# Patient Record
Sex: Female | Born: 1960 | Race: White | Hispanic: No | Marital: Single | Smoking: Never smoker
Health system: Southern US, Community
[De-identification: ages and names within clinical notes are randomized; demographics above are authoritative.]

## PROBLEM LIST (undated history)

## (undated) DIAGNOSIS — C50919 Malignant neoplasm of unspecified site of unspecified female breast: Secondary | ICD-10-CM

## (undated) DIAGNOSIS — I10 Essential (primary) hypertension: Secondary | ICD-10-CM

## (undated) DIAGNOSIS — C50212 Malignant neoplasm of upper-inner quadrant of left female breast: Secondary | ICD-10-CM

## (undated) DIAGNOSIS — E119 Type 2 diabetes mellitus without complications: Secondary | ICD-10-CM

## (undated) DIAGNOSIS — Z803 Family history of malignant neoplasm of breast: Secondary | ICD-10-CM

## (undated) DIAGNOSIS — Z973 Presence of spectacles and contact lenses: Secondary | ICD-10-CM

## (undated) HISTORY — DX: Essential (primary) hypertension: I10

## (undated) HISTORY — PX: TONSILLECTOMY: SHX5217

## (undated) HISTORY — DX: Malignant neoplasm of upper-inner quadrant of left female breast: C50.212

## (undated) HISTORY — PX: WISDOM TOOTH EXTRACTION: SHX21

## (undated) HISTORY — DX: Family history of malignant neoplasm of breast: Z80.3

---

## 1999-12-31 ENCOUNTER — Encounter: Payer: Self-pay | Admitting: *Deleted

## 1999-12-31 ENCOUNTER — Ambulatory Visit (HOSPITAL_COMMUNITY): Admission: RE | Admit: 1999-12-31 | Discharge: 1999-12-31 | Payer: Self-pay | Admitting: *Deleted

## 2010-09-13 ENCOUNTER — Encounter: Payer: Self-pay | Admitting: Internal Medicine

## 2014-08-26 ENCOUNTER — Other Ambulatory Visit: Payer: Self-pay | Admitting: Internal Medicine

## 2014-08-26 DIAGNOSIS — N6009 Solitary cyst of unspecified breast: Secondary | ICD-10-CM

## 2014-09-18 ENCOUNTER — Other Ambulatory Visit: Payer: Self-pay | Admitting: Internal Medicine

## 2014-09-18 DIAGNOSIS — N6320 Unspecified lump in the left breast, unspecified quadrant: Secondary | ICD-10-CM

## 2014-09-26 ENCOUNTER — Other Ambulatory Visit: Payer: Self-pay | Admitting: Internal Medicine

## 2014-09-26 ENCOUNTER — Ambulatory Visit
Admission: RE | Admit: 2014-09-26 | Discharge: 2014-09-26 | Disposition: A | Discharge status: Home / Self Care | Payer: Self-pay | Source: Ambulatory Visit | Attending: Internal Medicine | Admitting: Internal Medicine

## 2014-09-26 DIAGNOSIS — N6320 Unspecified lump in the left breast, unspecified quadrant: Secondary | ICD-10-CM

## 2014-09-29 ENCOUNTER — Other Ambulatory Visit: Payer: Self-pay | Admitting: Internal Medicine

## 2014-09-29 DIAGNOSIS — C50912 Malignant neoplasm of unspecified site of left female breast: Secondary | ICD-10-CM

## 2014-09-30 ENCOUNTER — Ambulatory Visit (HOSPITAL_COMMUNITY): Payer: 59 | Attending: Hematology

## 2014-09-30 ENCOUNTER — Telehealth: Payer: Self-pay | Admitting: *Deleted

## 2014-09-30 NOTE — Telephone Encounter (Signed)
Confirmed [facility] for 10/08/13 at 8am .  Instructions and contact information given.

## 2014-10-01 ENCOUNTER — Encounter: Payer: Self-pay | Admitting: *Deleted

## 2014-10-01 ENCOUNTER — Other Ambulatory Visit: Payer: Self-pay | Admitting: *Deleted

## 2014-10-01 DIAGNOSIS — C50212 Malignant neoplasm of upper-inner quadrant of left female breast: Secondary | ICD-10-CM

## 2014-10-01 HISTORY — DX: Malignant neoplasm of upper-inner quadrant of left female breast: C50.212

## 2014-10-02 ENCOUNTER — Other Ambulatory Visit: Payer: Self-pay

## 2014-10-07 ENCOUNTER — Ambulatory Visit
Admission: RE | Admit: 2014-10-07 | Discharge: 2014-10-07 | Disposition: A | Discharge status: Home / Self Care | Payer: 59 | Source: Ambulatory Visit | Attending: Internal Medicine | Admitting: Internal Medicine

## 2014-10-07 ENCOUNTER — Other Ambulatory Visit: Payer: Self-pay | Admitting: Internal Medicine

## 2014-10-07 DIAGNOSIS — C50912 Malignant neoplasm of unspecified site of left female breast: Secondary | ICD-10-CM

## 2014-10-07 MED ORDER — GADOBENATE DIMEGLUMINE 529 MG/ML IV SOLN
20.0000 mL | Freq: Once | INTRAVENOUS | Status: AC | PRN
  Administered 2014-10-07: 20 mL via INTRAVENOUS

## 2014-10-08 ENCOUNTER — Other Ambulatory Visit (HOSPITAL_BASED_OUTPATIENT_CLINIC_OR_DEPARTMENT_OTHER): Payer: 59

## 2014-10-08 ENCOUNTER — Encounter: Payer: Self-pay | Admitting: *Deleted

## 2014-10-08 ENCOUNTER — Ambulatory Visit
Admission: RE | Admit: 2014-10-08 | Discharge: 2014-10-08 | Disposition: A | Discharge status: Home / Self Care | Payer: 59 | Source: Ambulatory Visit | Attending: Radiation Oncology | Admitting: Radiation Oncology

## 2014-10-08 ENCOUNTER — Encounter: Payer: Self-pay | Admitting: Hematology

## 2014-10-08 ENCOUNTER — Ambulatory Visit: Payer: 59

## 2014-10-08 ENCOUNTER — Encounter: Payer: Self-pay | Admitting: Skilled Nursing Facility1

## 2014-10-08 ENCOUNTER — Ambulatory Visit (HOSPITAL_BASED_OUTPATIENT_CLINIC_OR_DEPARTMENT_OTHER): Payer: 59 | Admitting: Hematology

## 2014-10-08 ENCOUNTER — Ambulatory Visit (INDEPENDENT_AMBULATORY_CARE_PROVIDER_SITE_OTHER): Payer: Self-pay | Admitting: Surgery

## 2014-10-08 ENCOUNTER — Telehealth: Payer: Self-pay | Admitting: Hematology

## 2014-10-08 VITALS — BP 186/103 | HR 89 | Temp 98.4°F | Resp 18 | Ht 65.0 in | Wt 227.2 lb

## 2014-10-08 DIAGNOSIS — C773 Secondary and unspecified malignant neoplasm of axilla and upper limb lymph nodes: Secondary | ICD-10-CM | POA: Diagnosis not present

## 2014-10-08 DIAGNOSIS — C50212 Malignant neoplasm of upper-inner quadrant of left female breast: Secondary | ICD-10-CM | POA: Diagnosis not present

## 2014-10-08 DIAGNOSIS — Z171 Estrogen receptor negative status [ER-]: Secondary | ICD-10-CM

## 2014-10-08 LAB — CBC WITH DIFFERENTIAL/PLATELET
BASO%: 0.4 % (ref 0.0–2.0)
Basophils Absolute: 0 10*3/uL (ref 0.0–0.1)
EOS ABS: 0.1 10*3/uL (ref 0.0–0.5)
EOS%: 0.9 % (ref 0.0–7.0)
HCT: 44.3 % (ref 34.8–46.6)
HGB: 15.5 g/dL (ref 11.6–15.9)
LYMPH%: 14.8 % (ref 14.0–49.7)
MCH: 34.1 pg — ABNORMAL HIGH (ref 25.1–34.0)
MCHC: 35 g/dL (ref 31.5–36.0)
MCV: 97.4 fL (ref 79.5–101.0)
MONO#: 0.7 10*3/uL (ref 0.1–0.9)
MONO%: 6.7 % (ref 0.0–14.0)
NEUT%: 77.2 % — AB (ref 38.4–76.8)
NEUTROS ABS: 8.2 10*3/uL — AB (ref 1.5–6.5)
Platelets: 175 10*3/uL (ref 145–400)
RBC: 4.55 10*6/uL (ref 3.70–5.45)
RDW: 12.6 % (ref 11.2–14.5)
WBC: 10.7 10*3/uL — AB (ref 3.9–10.3)
lymph#: 1.6 10*3/uL (ref 0.9–3.3)

## 2014-10-08 LAB — COMPREHENSIVE METABOLIC PANEL (CC13)
ALT: 25 U/L (ref 0–55)
ANION GAP: 13 meq/L — AB (ref 3–11)
AST: 25 U/L (ref 5–34)
Albumin: 3.8 g/dL (ref 3.5–5.0)
Alkaline Phosphatase: 98 U/L (ref 40–150)
BUN: 15.2 mg/dL (ref 7.0–26.0)
CO2: 21 meq/L — AB (ref 22–29)
CREATININE: 0.9 mg/dL (ref 0.6–1.1)
Calcium: 9.2 mg/dL (ref 8.4–10.4)
Chloride: 102 mEq/L (ref 98–109)
EGFR: 73 mL/min/{1.73_m2} — ABNORMAL LOW (ref >90)
Glucose: 144 mg/dl — ABNORMAL HIGH (ref 70–140)
Potassium: 4.4 mEq/L (ref 3.5–5.1)
Sodium: 136 mEq/L (ref 136–145)
TOTAL PROTEIN: 7.3 g/dL (ref 6.4–8.3)
Total Bilirubin: 0.52 mg/dL (ref 0.20–1.20)

## 2014-10-08 MED ORDER — LORAZEPAM 1 MG PO TABS: 1.0000 mg | ORAL_TABLET | Freq: Every evening | ORAL | Status: DC | PRN

## 2014-10-08 NOTE — Progress Notes (Signed)
  Radiation Oncology         [phone]) [phone] ________________________________  Initial outpatient Consultation - Date: 10/08/2014   Name: Ashlee Hill MRN: [id number]   DOB: 1960/10/25  REFERRING PHYSICIAN: Erroll Luna, MD  STAGE: Breast cancer of upper-inner quadrant of left female breast   Staging form: Breast, AJCC 7th Edition     Clinical stage from 10/08/2014: Stage IIB (T2, N1, M0) - Unsigned     Pathologic: Stage IIB (T2, N1, cM0) - Unsigned  HISTORY OF PRESENT ILLNESS::Ashlee Hill is a 54 y.o. female  Who palpated a left breast mass. She was found to have a 2.5 cm mass in the left breast and left axillary lymph nodes were enlarged. MRI was performed last night and report is pending. 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 The lymph node was also positive for malignancy. She is tearful and overwhelmed.  She is accompanied by her parents and friend. She has some soreness after her biopsy. She is interested in breast conservation. She is GxP0 and still menstruating. No personal history of cancer.   PREVIOUS RADIATION THERAPY: No  Past medical, social and family history were reviewed in the electronic chart. Review of symptoms was reviewed in the electronic chart. Medications were reviewed in the electronic chart.   PHYSICAL EXAM: There were no vitals filed for this visit.. . Pleasant female. Moderate distress. No palpable adenopathy. Palpable mass in the left breast at the 12 o'clock position about 3 cm. Not fixed. No palpable abnormalities of the right breast.   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    PLAN:We discussed the role of radiation and decreasing local failures in patients who undergo lumpectomy. We discussed the retrospective data showing an increase in failure rates in patients who have a pathologic complete response and did not undergo radiation. For this reason I have recommended  radiation to the whole breast followed by boost to the tumor bed. We discussed the process of simulation the placement tattoos. We discussed possible side effects during treatment including but not limited to skin irritation darkness and fatigue. We discussed long-term effects of treatment which are extremely unlikely but possible including damage to the lungs and ribs. We discussed the low likelihood of secondary malignancies.  She is going to undergo staging and genetics. She had many financial concerns and we will get her scheduled with financial planning. She met with social workers TEFL teacher.    I spent 40 minutes face to face with the patient and more than 50% of that time was spent in counseling and/or coordination of care.   ------------------------------------------------  Thea Silversmith, MD

## 2014-10-08 NOTE — Progress Notes (Signed)
Subjective:     Patient ID: Ashlee Hill, female   DOB: [birth year], 54 y.o.   MRN: [id number]  HPI   Review of Systems     Objective:   Physical Exam  For the patient to understand and be given the tools to implement a healthy plant based diet during their cancer diagnosis.     Assessment:     Patient was seen today and found to be distressed and tearful and was accompanied by a seemingly supportive friend. Palpable left breast mass. Patients current/relevant medications: Lipitor, multivitamin, omega 3, garcinia gambosia (patient states she is taking this supplement as an appetite suppressant. Patient states she gets an upset stomach/nausea when she is mentally distressed and was curious about protein shakes for when she cannot eat.  Patient was not retaining much if any information as she was vastly overwhelmed and in information overload.   Patient is 5'5'' at 227 pounds with a BMI of 37.9.  Her WBC 10.7, CO2 21, and Glucose 144.     Plan:     Dietitian educated the patient on implementing a plant based diet by incorporating more plant proteins, fruits, and vegetables. As a part of a healthy routine physical activity was discussed. The importance of legitimate, evidence based information was discussed and examples were given. A folder of evidence based information with a focus on a plant based diet and general nutrition during cancer was given to the patient. Dietitian educated the patient on supplements regulation and the possible interactions with prescribed medications.  Dietitian educated the patient on the uses of protein shakes or smoothies when she cannot consume whole foods.  As a part of the continuum of care the cancer dietitian's contact information was given to the patient in the event they would like to have a follow up appointment.

## 2014-10-08 NOTE — Progress Notes (Signed)
Checked in new pt with no financial concerns prior to seeing the dr. Informed pt if chemo is part of her treatment Ashlee Hill will get auth from her insurance company as well as contact foundations that offer copay assistance for chemo if needed. She has Ashlee Hill's card for any billing questions or concerns.

## 2014-10-08 NOTE — Progress Notes (Deleted)
[facility] [place]  [phone] Fax:([phone]) Anthony Note   Patient Care Team: Kandice Hams, MD as PCP - General (Internal Medicine) Erroll Luna, MD as Consulting Physician (General Surgery) Truitt Merle, MD as Consulting Physician (Hematology) Thea Silversmith, MD as Consulting Physician (Radiation Oncology) Roselee Culver, RN as Registered Nurse [facility] Caleen Jobs, RN as Registered Nurse 10/08/2014  CHIEF COMPLAINTS/PURPOSE OF CONSULTATION:  ***  HISTORY OF PRESENTING ILLNESS:  Ashlee Hill 54 y.o. female is here because of ***  MEDICAL HISTORY:  Past Medical History  Diagnosis Date  . Breast cancer of upper-inner quadrant of left female breast 10/01/2014    SURGICAL HISTORY: No past surgical history on file.  SOCIAL HISTORY: History   Social History  . Marital Status: Single    Spouse Name: N/A  . Number of Children: N/A  . Years of Education: N/A   Occupational History  . Not on file.   Social History Main Topics  . Smoking status: Not on file  . Smokeless tobacco: Not on file  . Alcohol Use: Not on file  . Drug Use: Not on file  . Sexual Activity: Not on file   Other Topics Concern  . Not on file   Social History Narrative  . No narrative on file    FAMILY HISTORY: No family history on file.  ALLERGIES:  has no allergies on file.  MEDICATIONS:  No current outpatient prescriptions on file.   No current facility-administered medications for this visit.    REVIEW OF SYSTEMS:   Constitutional: Denies fevers, chills or abnormal night sweats Eyes: Denies blurriness of vision, double vision or watery eyes Ears, nose, mouth, throat, and face: Denies mucositis or sore throat Respiratory: Denies cough, dyspnea or wheezes Cardiovascular: Denies palpitation, chest discomfort or lower extremity swelling Gastrointestinal:  Denies nausea, heartburn or change in bowel habits Skin: Denies abnormal skin  rashes Lymphatics: Denies new lymphadenopathy or easy bruising Neurological:Denies numbness, tingling or new weaknesses Behavioral/Psych: Mood is stable, no new changes  All other systems were reviewed with the patient and are negative.  PHYSICAL EXAMINATION: ECOG PERFORMANCE STATUS: {CHL ONC ECOG PS:[id number]}  There were no vitals filed for this visit. There were no vitals filed for this visit.  GENERAL:alert, no distress and comfortable SKIN: skin color, texture, turgor are normal, no rashes or significant lesions EYES: normal, conjunctiva are pink and non-injected, sclera clear OROPHARYNX:no exudate, no erythema and lips, buccal mucosa, and tongue normal  NECK: supple, thyroid normal size, non-tender, without nodularity LYMPH:  no palpable lymphadenopathy in the cervical, axillary or inguinal LUNGS: clear to auscultation and percussion with normal breathing effort HEART: regular rate & rhythm and no murmurs and no lower extremity edema ABDOMEN:abdomen soft, non-tender and normal bowel sounds Musculoskeletal:no cyanosis of digits and no clubbing  PSYCH: alert & oriented x 3 with fluent speech NEURO: no focal motor/sensory deficits  LABORATORY DATA:  I have reviewed the data as listed No results found for: WBC, HGB, HCT, MCV, PLT No results for input(s): NA, K, CL, CO2, GLUCOSE, BUN, CREATININE, CALCIUM, GFRNONAA, GFRAA, PROT, ALBUMIN, AST, ALT, ALKPHOS, BILITOT, BILIDIR, IBILI in the last 8760 hours.  PATHOLOGY REPORT 09/26/2014  Estrogen Receptor: 0%, NEGATIVE Progesterone Receptor: 0%, NEGATIVE Proliferation Marker Ki67: 93% RADIOGRAPHIC STUDIES: I have personally reviewed the radiological images as listed and agreed with the findings in the report. Mm Digital Diagnostic Bilat  09/26/2014   CLINICAL DATA:  54 year old female with palpable mass in the  upper inner left breast discovered on self-examination. Also for annual bilateral mammograms.  EXAM: DIGITAL DIAGNOSTIC  BILATERAL MAMMOGRAM WITH CAD  ULTRASOUND LEFT BREAST  COMPARISON:  None  ACR Breast Density Category b: There are scattered areas of fibroglandular density.  FINDINGS: Spot compression views of the left breast and routine views of both breasts demonstrate a 2 x 2.5 cm irregular mass in the upper inner left breast.  Mildly prominent left axillary lymph nodes are noted.  No suspicious findings in the right breast identified.  Mammographic images were processed with CAD.  On physical exam, a firm fixed area of thickening is identified at the 11 o'clock position of the left breast 6 cm from the nipple.  Targeted ultrasound is performed, showing a 1.9 x 2.5 x 2.3 cm irregular hypoechoic mass at the 11 o'clock position of the left breast 6 cm from the nipple. An adjacent 1.1 x 0.5 x 0.4 cm irregular hypoechoic mass is noted, 4 mm anterior and lateral to the larger irregular mass. Both of these masses and compress an area measuring 3 cm.  Enlarged level 1 left axillary lymph nodes are identified.  IMPRESSION: 2.5 cm irregular mass in the upper inner left breast with adjacent 1.1 cm satellite mass and enlarged left axillary lymph nodes, highly suspicious for left breast malignancy and left axillary lymph node metastases. Tissue sampling is recommended.  No mammographic evidence of right breast malignancy.  RECOMMENDATION: Ultrasound-guided biopsies of dominant upper inner left breast mass and an enlarged left axillary lymph node. These procedures will be performed today but dictated in a separate report.  I have discussed the findings and recommendations with the patient. Results were also provided in writing at the conclusion of the visit. If applicable, a reminder letter will be sent to the patient regarding the next appointment.  BI-RADS CATEGORY  5: Highly suggestive of malignancy.   Electronically Signed   By: Hassan Rowan M.D.   On: 09/26/2014 13:04   Mm Digital Diagnostic Unilat L  09/26/2014   CLINICAL DATA:  Evaluate  clip placement following ultrasound-guided left breast biopsy.  EXAM: DIAGNOSTIC LEFT MAMMOGRAM POST ULTRASOUND BIOPSY  COMPARISON:  Previous exams  FINDINGS: Mammographic images were obtained following ultrasound guided biopsy of the 2.5 cm mass at the 11 o'clock position of the left breast.  The paper clip heart shaped tissue marker is in satisfactory position.  No immediate complications identified.  IMPRESSION: Satisfactory clip placement following ultrasound-guided left breast biopsy.  Final Assessment: Post Procedure Mammograms for Marker Placement   Electronically Signed   By: Hassan Rowan M.D.   On: 09/26/2014 13:11   US Breast Ltd Uni Left Inc Axilla  09/26/2014   CLINICAL DATA:  54 year old female with palpable mass in the upper inner left breast discovered on self-examination. Also for annual bilateral mammograms.  EXAM: DIGITAL DIAGNOSTIC BILATERAL MAMMOGRAM WITH CAD  ULTRASOUND LEFT BREAST  COMPARISON:  None  ACR Breast Density Category b: There are scattered areas of fibroglandular density.  FINDINGS: Spot compression views of the left breast and routine views of both breasts demonstrate a 2 x 2.5 cm irregular mass in the upper inner left breast.  Mildly prominent left axillary lymph nodes are noted.  No suspicious findings in the right breast identified.  Mammographic images were processed with CAD.  On physical exam, a firm fixed area of thickening is identified at the 11 o'clock position of the left breast 6 cm from the nipple.  Targeted ultrasound is performed, showing a  1.9 x 2.5 x 2.3 cm irregular hypoechoic mass at the 11 o'clock position of the left breast 6 cm from the nipple. An adjacent 1.1 x 0.5 x 0.4 cm irregular hypoechoic mass is noted, 4 mm anterior and lateral to the larger irregular mass. Both of these masses and compress an area measuring 3 cm.  Enlarged level 1 left axillary lymph nodes are identified.  IMPRESSION: 2.5 cm irregular mass in the upper inner left breast with adjacent 1.1  cm satellite mass and enlarged left axillary lymph nodes, highly suspicious for left breast malignancy and left axillary lymph node metastases. Tissue sampling is recommended.  No mammographic evidence of right breast malignancy.  RECOMMENDATION: Ultrasound-guided biopsies of dominant upper inner left breast mass and an enlarged left axillary lymph node. These procedures will be performed today but dictated in a separate report.  I have discussed the findings and recommendations with the patient. Results were also provided in writing at the conclusion of the visit. If applicable, a reminder letter will be sent to the patient regarding the next appointment.  BI-RADS CATEGORY  5: Highly suggestive of malignancy.   Electronically Signed   By: Hassan Rowan M.D.   On: 09/26/2014 13:04   [place] Lt Breast Bx W Loc Dev 1st Lesion Img Bx Spec US Guide  09/29/2014   ADDENDUM REPORT: 09/29/2014 12:06  ADDENDUM: Pathology revealed grade II invasive mammary carcinoma and mammary carcinoma in situ in the left breast. The left axillary lymph node showed metastatic mammary carcinoma. This was found to be concordant by Dr. Hassan Rowan. Pathology was discussed with the patient by telephone. She reported both biopsy sites were without bruising or tenderness. Post biopsy instructions and care were reviewed and her questions were answered. She has been scheduled at [facility] on October 08, 2014. A bilateral breast MRI has been scheduled on October 07, 2014. She is encouraged to come to [facility] for educational materials. My number was provided for future questions and concerns.  Pathology results reported by Susa Raring RN, BSN on October 08, 2014.   Electronically Signed   By: Hassan Rowan M.D.   On: 09/29/2014 12:06   09/29/2014   CLINICAL DATA:  54 year old female with irregular mass in the upper inner left breast and enlarged left axillary lymph nodes. For tissue sampling  of irregular mass in the upper inner left breast.  EXAM: ULTRASOUND GUIDED LEFT BREAST CORE NEEDLE BIOPSY  COMPARISON:  Previous exam(s).  FINDINGS: I met with the patient and we discussed the procedure of ultrasound-guided biopsy, including benefits and alternatives. We discussed the high likelihood of a successful procedure. We discussed the risks of the procedure, including infection, bleeding, tissue injury, clip migration, and inadequate sampling. Informed written consent was given. The usual time-out protocol was performed immediately prior to the procedure.  Using sterile technique and 2% Lidocaine as local anesthetic, under direct ultrasound visualization, a 14 gauge spring-loaded device was used to perform biopsy of the 2.5 cm mass at the 11 o'clock position of the left breast using a lateral approach. At the conclusion of the procedure a paper clip heart shaped tissue marker clip was deployed into the biopsy cavity. Follow up 2 view mammogram was performed and dictated separately.  IMPRESSION: Ultrasound guided biopsy of upper inner left breast mass. No apparent complications.  Pathology will be followed.  Electronically Signed: By: Hassan Rowan M.D. On: 09/26/2014 13:08   [place] Lt Breast Bx  W Loc Dev Ea Add Lesion Img Bx Spec US Guide  09/29/2014   ADDENDUM REPORT: 09/29/2014 12:07  ADDENDUM: Pathology revealed grade II invasive mammary carcinoma and mammary carcinoma in situ in the left breast. The left axillary lymph node showed metastatic mammary carcinoma. This was found to be concordant by Dr. Hassan Rowan. Pathology was discussed with the patient by telephone. She reported both biopsy sites were without bruising or tenderness. Post biopsy instructions and care were reviewed and her questions were answered. She has been scheduled at [facility] on October 08, 2014. A bilateral breast MRI has been scheduled on October 07, 2014. She is encouraged to come to [facility] for educational materials. My number was provided for future questions and concerns.  Pathology results reported by Susa Raring RN, BSN on September 29, 2014.   Electronically Signed   By: Hassan Rowan M.D.   On: 09/29/2014 12:07   09/29/2014   CLINICAL DATA:  54 year old female with irregular mass in the upper inner left breast and enlarged left axillary lymph nodes. For tissue sampling of an enlarged level 1 left axillary lymph node.  EXAM: ULTRASOUND GUIDED CORE NEEDLE BIOPSY OF A LEFT AXILLARY NODE  COMPARISON:  Previous exam(s).  FINDINGS: I met with the patient and we discussed the procedure of ultrasound-guided biopsy, including benefits and alternatives. We discussed the high likelihood of a successful procedure. We discussed the risks of the procedure, including infection, bleeding, tissue injury, clip migration, and inadequate sampling. Informed written consent was given. The usual time-out protocol was performed immediately prior to the procedure.  Using sterile technique and 2% Lidocaine as local anesthetic, under direct ultrasound visualization, a 14 gauge spring-loaded device was used to perform biopsy of an enlarged level 1 left axillary lymph node using a inferior approach. A tissue marker clip was not deployed.  IMPRESSION: Ultrasound guided biopsy of enlarged left axillary lymph node. No apparent complications.  Pathology will be followed.  Electronically Signed: By: Hassan Rowan M.D. On: 09/26/2014 13:10    ASSESSMENT & PLAN:  *** No orders of the defined types were placed in this encounter.    All questions were answered. The patient knows to call the clinic with any problems, questions or concerns. I spent {CHL ONC TIME VISIT - OZDGU:[id number]} counseling the patient face to face. The total time spent in the appointment was {CHL ONC TIME VISIT - DGLOV:[id number]} and more than 50% was on counseling.     Truitt Merle, MD 10/08/2014 8:56 AM

## 2014-10-08 NOTE — Telephone Encounter (Signed)
Confirm echo appt for 02/22.

## 2014-10-08 NOTE — H&P (Signed)
Amana L. Stonehocker 10/08/2014 7:46 AM Location: [facility] Patient #: [id number] DOB: [birth year] Undefined / Language: [name] / Race: Undefined Female History of Present Illness [name] A. Sravya Grissom MD; 10/08/2014 2:34 PM) Patient words: Pt presents to [facility] at the request of Dr Pablo Ledger for left breast cancer. Pt has 2.5 cm mass. Found by patient last september. Left upper outer breast. Not tender and no discharge.          CLINICAL DATA: 54 year old female with palpable mass in the upper inner left breast discovered on self-examination. Also for annual bilateral mammograms. EXAM: DIGITAL DIAGNOSTIC BILATERAL MAMMOGRAM WITH CAD ULTRASOUND LEFT BREAST COMPARISON: None ACR Breast Density Category b: There are scattered areas of fibroglandular density. FINDINGS: Spot compression views of the left breast and routine views of both breasts demonstrate a 2 x 2.5 cm irregular mass in the upper inner left breast. Mildly prominent left axillary lymph nodes are noted. No suspicious findings in the right breast identified. Mammographic images were processed with CAD. On physical exam, a firm fixed area of thickening is identified at the 11 o'clock position of the left breast 6 cm from the nipple. Targeted ultrasound is performed, showing a 1.9 x 2.5 x 2.3 cm irregular hypoechoic mass at the 11 o'clock position of the left breast 6 cm from the nipple. An adjacent 1.1 x 0.5 x 0.4 cm irregular hypoechoic mass is noted, 4 mm anterior and lateral to the larger irregular mass. Both of these masses and compress an area measuring 3 cm. Enlarged level 1 left axillary lymph nodes are identified. IMPRESSION: 2.5 cm irregular mass in the upper inner left breast with adjacent 1.1 cm satellite mass and enlarged left axillary lymph nodes, highly suspicious for left breast malignancy and left axillary lymph node metastases. Tissue sampling is recommended. No mammographic evidence of  right breast malignancy. RECOMMENDATION: Ultrasound-guided biopsies of dominant upper inner left breast mass and an enlarged left axillary lymph node. These procedures will be performed today but dictated in a separate report. I have discussed the findings and recommendations with the patient. Results were also provided in writing at the conclusion of the visit. If applicable, a reminder letter will be sent to the patient regarding the next appointment. BI-RADS CATEGORY 5: Highly suggestive of malignancy. Electronically Signed By: Hassan Rowan M.D. On: 09/26/2014 13:04   External Result Report  ADDITIONAL INFORMATION: 1. A sample was sent to [facility] for HER-2 testing by FISH. The results are as follows: Negative. (JBK:ecj 10/08/2014) Enid Cutter MD Pathologist, Electronic Signature ( Signed 10/08/2014) 1. PROGNOSTIC INDICATORS - ACIS Results: IMMUNOHISTOCHEMICAL AND MORPHOMETRIC ANALYSIS BY THE AUTOMATED CELLULAR IMAGING SYSTEM (ACIS) Estrogen Receptor: 0%, NEGATIVE Progesterone Receptor: 0%, NEGATIVE Proliferation Marker Ki67: 93% COMMENT: The negative hormone receptor study(ies) in this case have an internal positive control. REFERENCE RANGE ESTROGEN RECEPTOR NEGATIVE <1% POSITIVE =>1% PROGESTERONE RECEPTOR NEGATIVE <1% POSITIVE =>1% All controls stained appropriately Enid Cutter MD Pathologist, Electronic Signature ( Signed 10/01/2014) 1 of 3 FINAL for Lowenstein, Kynzley L ([id number]) ADDITIONAL INFORMATION:(continued) 1. The invasive carcinoma demonstrates strong diffuse E-cadherin expression; supporting a ductal phenotype. 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 (CRR:gt, 09/30/14) This is NOT signed out FINAL DIAGNOSIS Diagnosis 1. 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 2. 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 Microscopic Comment 1. Although grade of tumor is best assessed at resection, with these biopsies, both the in situ and invasive carcinoma are grade II. E cadherin and breast prognostic studies are pending and reported in an addendum. The case is reviewed with Dr. Lyndon Code who concurs. Mali RUND DO Pathologist, Electronic Signature (Case signed 09/29/2014)  CLINICAL DATA: 54 year old female with palpable mass in the upper inner left breast discovered on self-examination. Also for annual bilateral mammograms.  EXAM: DIGITAL DIAGNOSTIC BILATERAL MAMMOGRAM WITH CAD  ULTRASOUND LEFT BREAST  COMPARISON: None  ACR Breast Density Category b: There are scattered areas of fibroglandular density.  FINDINGS: Spot compression views of the left breast and routine views of both breasts demonstrate a 2 x 2.5 cm irregular mass in the upper inner left breast.  Mildly prominent left axillary lymph nodes are noted.  No suspicious findings in the right breast identified.  Mammographic images were processed with CAD.  On physical exam, a firm fixed area of thickening is identified at the 11 o'clock position of the left breast 6 cm from the nipple.  Targeted ultrasound is performed, showing a 1.9 x 2.5 x 2.3 cm irregular hypoechoic mass at the 11 o'clock position of the left breast 6 cm from the nipple. An adjacent 1.1 x 0.5 x 0.4 cm irregular hypoechoic mass is noted, 4 mm anterior and lateral to the larger irregular mass. Both of these masses and compress an area measuring 3 cm.  Enlarged level 1 left axillary lymph nodes are identified.  IMPRESSION: 2.5 cm irregular mass in the upper inner left breast with adjacent 1.1 cm satellite mass and enlarged left axillary lymph nodes, highly suspicious for left breast malignancy and left  axillary lymph node metastases. Tissue sampling is recommended.  No mammographic evidence of right breast malignancy.  RECOMMENDATION: Ultrasound-guided biopsies of dominant upper inner left breast mass and an enlarged left axillary lymph node. These procedures will be performed today but dictated in a separate report.  I have discussed the findings and recommendations with the patient. Results were also provided in writing at the conclusion of the visit. If applicable, a reminder letter will be sent to the patient regarding the next appointment.  BI-RADS CATEGORY 5: Highly suggestive of malignancy.   Electronically Signed By: Hassan Rowan M.D. On: 09/26/2014 13:04Specimen Gross and Clinical Information.  The patient is a 54 year old female   Other Problems Anderson Malta [place], [place]; 10/08/2014 7:46 AM) Anxiety Disorder Back Pain Depression Hemorrhoids High blood pressure Hypercholesterolemia  Past Surgical History Jeanann Lewandowsky, RMA; 10/08/2014 7:46 AM) Breast Biopsy Left. Oral Surgery Tonsillectomy  Diagnostic Studies History Anderson Malta [place], [place]; 10/08/2014 7:46 AM) Colonoscopy never Mammogram within last year Pap Smear >5 years ago  Social History Anderson Malta [place], RMA; 10/08/2014 7:46 AM) Alcohol use Moderate alcohol use. No caffeine use No drug use Tobacco use Never smoker.  Family History Anderson Malta [place], [place]; 10/08/2014 7:46 AM) Anesthetic complications Father, Mother. Arthritis Father, Mother. Bleeding disorder Father. Cervical Cancer Family Members In General. Depression Family Members In General, Father, Mother. Diabetes Mellitus Family Members In General. Heart Disease Father. Heart disease in female family member before age 75 Hypertension Family Members In General, Father, Mother. Ischemic Bowel Disease Mother. Melanoma Father. Migraine Headache Mother. Prostate Cancer Father. Respiratory Condition Family Members In  General. Thyroid problems Father, Mother.  Pregnancy / Birth History Anderson Malta [place], [place]; 10/08/2014 7:46 AM) Age at menarche 60 years. Gravida 0 Para 0 Regular periods     Review of  Systems [employer] Witty RMA; 10/08/2014 7:46 AM) General Not Present- Appetite Loss, Chills, Fatigue, Fever, Night Sweats, Weight Gain and Weight Loss. Skin Not Present- Change in Wart/Mole, Dryness, Hives, Jaundice, New Lesions, Non-Healing Wounds, Rash and Ulcer. HEENT Present- Nose Bleed and Seasonal Allergies. Not Present- Earache, Hearing Loss, Hoarseness, Oral Ulcers, Ringing in the Ears, Sinus Pain, Sore Throat, Visual Disturbances, Wears glasses/contact lenses and Yellow Eyes. Respiratory Not Present- Bloody sputum, Chronic Cough, Difficulty Breathing, Snoring and Wheezing. Breast Present- Breast Mass and Breast Pain. Not Present- Nipple Discharge and Skin Changes. Cardiovascular Not Present- Chest Pain, Difficulty Breathing Lying Down, Leg Cramps, Palpitations, Rapid Heart Rate, Shortness of Breath and Swelling of Extremities. Gastrointestinal Present- Bloating and Constipation. Not Present- Abdominal Pain, Bloody Stool, Change in Bowel Habits, Chronic diarrhea, Difficulty Swallowing, Excessive gas, Gets full quickly at meals, Hemorrhoids, Indigestion, Nausea, Rectal Pain and Vomiting. Female Genitourinary Not Present- Frequency, Nocturia, Painful Urination, Pelvic Pain and Urgency. Musculoskeletal Not Present- Back Pain, Joint Pain, Joint Stiffness, Muscle Pain, Muscle Weakness and Swelling of Extremities. Neurological Not Present- Decreased Memory, Fainting, Headaches, Numbness, Seizures, Tingling, Tremor, Trouble walking and Weakness. Psychiatric Present- Anxiety, Change in Sleep Pattern, Depression and Fearful. Not Present- Bipolar and Frequent crying. Endocrine Present- Hot flashes. Not Present- Cold Intolerance, Excessive Hunger, Hair Changes, Heat Intolerance and New Diabetes. Hematology Not  Present- Easy Bruising, Excessive bleeding, Gland problems, HIV and Persistent Infections.   Physical Exam (Keriana Sarsfield A. Tirth Cothron MD; 10/08/2014 2:35 PM)  General Mental Status-Alert. General Appearance-Consistent with stated age. Hydration-Well hydrated. Voice-Normal.  Head and Neck Head-normocephalic, atraumatic with no lesions or palpable masses. Trachea-midline. Thyroid Gland Characteristics - normal size and consistency.  Eye Eyeball - Bilateral-Extraocular movements intact. Sclera/Conjunctiva - Bilateral-No scleral icterus.  Chest and Lung Exam Chest and lung exam reveals -quiet, even and easy respiratory effort with no use of accessory muscles and on auscultation, normal breath sounds, no adventitious sounds and normal vocal resonance. Inspection Chest Wall - Normal. Back - normal.  Breast Breast - Left-Symmetric, Non Tender, No Biopsy scars, no Dimpling, No Inflammation, No Lumpectomy scars, No Mastectomy scars, No Peau d' Orange. Breast - Right-Symmetric, Non Tender, No Biopsy scars, no Dimpling, No Inflammation, No Lumpectomy scars, No Mastectomy scars, No Peau d' Orange. Breast Lump-No Palpable Breast Mass. Note: left breast mass upper outer quadreant 2 -3 cm mobile   Cardiovascular Cardiovascular examination reveals -normal heart sounds, regular rate and rhythm with no murmurs and normal pedal pulses bilaterally.  Abdomen Inspection Inspection of the abdomen reveals - No Hernias. Skin - Scar - no surgical scars. Palpation/Percussion Palpation and Percussion of the abdomen reveal - Soft, Non Tender, No Rebound tenderness, No Rigidity (guarding) and No hepatosplenomegaly. Auscultation Auscultation of the abdomen reveals - Bowel sounds normal.  Neurologic Neurologic evaluation reveals -alert and oriented x 3 with no impairment of recent or remote memory. Mental Status-Normal.  Musculoskeletal Normal Exam - Left-Upper Extremity  Strength Normal and Lower Extremity Strength Normal. Normal Exam - Right-Upper Extremity Strength Normal and Lower Extremity Strength Normal.  Lymphatic Head & Neck  General Head & Neck Lymphatics: Bilateral - Description - Normal. Axillary  General Axillary Region: Bilateral - Description - Normal. Tenderness - Non Tender. Note:1 - 2 cm mobile LN left AXILLLA no matted. Femoral & Inguinal  Generalized Femoral & Inguinal Lymphatics: Bilateral - Description - Normal. Tenderness - Non Tender.    Assessment & Plan (Michiah Mudry A. Kalijah Westfall MD; 10/08/2014 2:37 PM)  BREAST CANCER, LEFT (174.9  C50.912) Impression: sem with  oncology and radiation. Pt is a good chemotherapy candidate and may qualify for study to avoid ALND after chemtherapy. She will need a port and will get her set up for this. Risk of bleeding, infection, PTX, hemothorax, migration, fragmentation and replacement , organ injury discussed. She agrees to proceed.

## 2014-10-08 NOTE — Progress Notes (Signed)
[facility]  Telephone:([phone] Fax:([phone]) [place] Note   Patient Care Team: Kandice Hams, MD as PCP - General (Internal Medicine) Erroll Luna, MD as Consulting Physician (General Surgery) Truitt Merle, MD as Consulting Physician (Hematology) Thea Silversmith, MD as Consulting Physician (Radiation Oncology) Roselee Culver, RN as Registered Nurse [facility]), RN as Registered Nurse 10/12/2014  CHIEF COMPLAINTS/PURPOSE OF CONSULTATION:  Newly diagnosed breast cancer    Breast cancer of upper-inner quadrant of left female breast   09/26/2014 Breast US 2.5 cm irregular mass in the upper inner left breast with adjacent 1.1 cm satellite mass and enlarged left axillary lymph nodes, highly suspicious for left breast malignancy and left axillary lymph node metastases. Tissue sampling is recommended.    09/26/2014 Mammogram Spot compression views of the left breast and routine views of both breasts demonstrate a 2 x 2.5 cm irregular mass in the upper inner left breast.    09/29/2014 Initial Biopsy 1. Breast, left, needle core biopsy, mass, 11 o'clock - INVASIVE MAMMARY CARCINOMA, SEE COMMENT. - MAMMARY CARCINOMA IN SITU. 2. Lymph node, needle/core biopsy, left axillary - ONE LYMPH NODE, POSITIVE FOR METASTATIC MAMMARY CARCINOMA (1/1).   09/29/2014 Receptors her2 Estrogen Receptor: 0%, NEGATIVE Progesterone Receptor: 0%, NEGATIVE Proliferation Marker Ki67: 93%   10/01/2014 Initial Diagnosis Breast cancer of upper-inner quadrant of left female breast     HISTORY OF PRESENTING ILLNESS:  CHE BELOW 54 y.o. female presents to our multidisciplinary breast clinic today to discuss the management of her newly diagnosed breast cancer  She noticed a left breast lump in August 2015, no tenderness, skin or nipple change. She otherwise felt well. She did not seek immediate medical attention due to lack of insurance. She finally got her insurance  approved and saw her primary care physician recently. She was referred for mammogram which showed a 2.5 cm irregular mass in the upper inner left breast. She underwent left breast mass and axillary node biopsy on 09/29/2014 and both biopsy showed invasive ductal carcinoma, ER negative, PR negative, HER-2 negative.  She otherwise feels well, no pain or ther complains. She has good appetite and her weight is stable.  MEDICAL HISTORY:  Past Medical History  Diagnosis Date  . Breast cancer of upper-inner quadrant of left female breast 10/01/2014  . Hypertension     SURGICAL HISTORY: Past Surgical History  Procedure Laterality Date  . Tonsillectomy    . Wisdom tooth extraction       GYN HISTORY  Menarchal: 11 LMP: 09/27/2014 Contraceptive: no HRT:  G0P0    SOCIAL HISTORY: History   Social History  . Marital Status: Single    Spouse Name: N/A  . Number of Children: N/A  . Years of Education: N/A   Occupational History  . Not on file.   Social History Main Topics  . Smoking status: Never Smoker   . Smokeless tobacco: Not on file  . Alcohol Use: 0.6 oz/week    1 Glasses of wine per week     Comment: socail drinker  . Drug Use: No  . Sexual Activity: Not on file   Other Topics Concern  . Not on file   Social History Narrative    FAMILY HISTORY: Family History  Problem Relation Age of Onset  . Cancer Father   . Prostate cancer Father   . Breast cancer Paternal Aunt   . Lung cancer Maternal Grandfather   . Thyroid cancer Cousin  Father had prostate cancer at age of 66 Paternal aunt had breast caner in her 62's Paternal cousin had thyroid cancer at age of before 22 Maternal grandfather had lung cancer    ALLERGIES:  has No Known Allergies.  MEDICATIONS:  Current Outpatient Prescriptions  Medication Sig Dispense Refill  . ALPRAZolam (XANAX) 1 MG tablet Take 1 mg by mouth as needed for anxiety.    Marland Kitchen aspirin 81 MG tablet Take 81 mg by mouth daily.    Marland Kitchen  atorvastatin (LIPITOR) 10 MG tablet Take 10 mg by mouth daily.    . calcium carbonate (OS-CAL) 600 MG TABS tablet Take 600 mg by mouth daily with breakfast.    . gabapentin (NEURONTIN) 100 MG capsule Take 100 mg by mouth as needed.    Marland Kitchen GARCINIA CAMBOGIA-CHROMIUM PO Take 2 tablets by mouth daily.    . metoprolol (LOPRESSOR) 100 MG tablet Take 100 mg by mouth daily.    . Misc Natural Products (OSTEO BI-FLEX ADV DOUBLE ST PO) Take 1 tablet by mouth daily.    . Multiple Vitamin (MULTIVITAMIN) tablet Take 1 tablet by mouth daily.    . Nutritional Supplements (ESTROVEN PO) Take 1 tablet by mouth daily.    . Nutritional Supplements (SILICA) 67.8 MG CAPS Take 1 capsule by mouth daily.    . Omega-3 Fatty Acids (OMEGA-3 EPA FISH OIL PO) Take 2 tablets by mouth daily.    Marland Kitchen venlafaxine XR (EFFEXOR-XR) 150 MG 24 hr capsule Take 150 mg by mouth daily with breakfast.    . hydrochlorothiazide (HYDRODIURIL) 25 MG tablet TAKE 1 TABLET DAILY    . LORazepam (ATIVAN) 1 MG tablet Take 1 tablet (1 mg total) by mouth at bedtime as needed for sleep. 30 tablet 0   No current facility-administered medications for this visit.    REVIEW OF SYSTEMS:   Constitutional: Denies fevers, chills or abnormal night sweats Eyes: Denies blurriness of vision, double vision or watery eyes Ears, nose, mouth, throat, and face: Denies mucositis or sore throat Respiratory: Denies cough, dyspnea or wheezes Cardiovascular: Denies palpitation, chest discomfort or lower extremity swelling Gastrointestinal:  Denies nausea, heartburn or change in bowel habits Skin: Denies abnormal skin rashes Lymphatics: Denies new lymphadenopathy or easy bruising Neurological:Denies numbness, tingling or new weaknesses Behavioral/Psych: Mood is stable, no new changes  All other systems were reviewed with the patient and are negative.  PHYSICAL EXAMINATION: ECOG PERFORMANCE STATUS: 0 - Asymptomatic  Filed Vitals:   10/08/14 0858  BP: 186/103    Pulse: 89  Temp: 98.4 F (36.9 C)  Resp: 18   Filed Weights   10/08/14 0858  Weight: 227 lb 3.2 oz (103.057 kg)    GENERAL:alert, no distress and comfortable SKIN: skin color, texture, turgor are normal, no rashes or significant lesions EYES: normal, conjunctiva are pink and non-injected, sclera clear OROPHARYNX:no exudate, no erythema and lips, buccal mucosa, and tongue normal  NECK: supple, thyroid normal size, non-tender, without nodularity LYMPH:  no palpable lymphadenopathy in the cervical, axillary or inguinal LUNGS: clear to auscultation and percussion with normal breathing effort HEART: regular rate & rhythm and no murmurs and no lower extremity edema ABDOMEN:abdomen soft, non-tender and normal bowel sounds Musculoskeletal:no cyanosis of digits and no clubbing  PSYCH: alert & oriented x 3 with fluent speech NEURO: no focal motor/sensory deficits Breasts: Breast inspection showed them to be symmetrical with no nipple discharge. Palpation of the left breasts showed a 4X3cm mass in the upper outer quadrant, no palpable axillary lymph node.  Exam of the right breast and axillary was normal.    LABORATORY DATA:  I have reviewed the data as listed Lab Results  Component Value Date   WBC 10.7* 10/08/2014   HGB 15.5 10/08/2014   HCT 44.3 10/08/2014   MCV 97.4 10/08/2014   PLT 175 10/08/2014    Recent Labs  10/08/14 0833  NA 136  K 4.4  CO2 21*  GLUCOSE 144*  BUN 15.2  CREATININE 0.9  CALCIUM 9.2  PROT 7.3  ALBUMIN 3.8  AST 25  ALT 25  ALKPHOS 98  BILITOT 0.52    PATHOLOGY REPORT 09/26/2014 Diagnosis 1. Breast, left, needle core biopsy, mass, 11 o'clock - INVASIVE MAMMARY CARCINOMA, SEE COMMENT. - MAMMARY CARCINOMA IN SITU. 2. Lymph node, needle/core biopsy, left axillary - ONE LYMPH NODE, POSITIVE FOR METASTATIC MAMMARY CARCINOMA (1/1). Microscopic Comment 1. Although grade of tumor is best assessed at resection, with these biopsies, both the in situ and  invasive carcinoma are grade II. The invasive carcinoma demonstrates strong diffuse E-cadherin expression; supporting a ductal phenotype. 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  Estrogen Receptor: 0%, NEGATIVE Progesterone Receptor: 0%, NEGATIVE Proliferation Marker Ki67: 93% 1. A sample was sent to [facility] for HER-2 testing by FISH. The results are as follows: Negative.  RADIOGRAPHIC STUDIES: I have personally reviewed the radiological images as listed and agreed with the findings in the report.  Mr Breast Bilateral W Wo Contrast 10/08/2014    IMPRESSION: 1. Biopsy proven malignancy in the upper inner left breast with adjacent/contiguous areas of nodularity measures up to 3.5 cm, with multiple morphologically abnormal axillary lymph nodes compatible with known axillary metastases.  2. No MRI evidence of malignancy in the right breast.  RECOMMENDATION: Treatment plan for known left breast malignancy.  BI-RADS CATEGORY  6: Known biopsy-proven malignancy.   Electronically Signed   By: Everlean Alstrom M.D.   On: 10/08/2014 10:29   US Breast Ltd Uni Left Inc Axilla 09/26/2014    IMPRESSION: 2.5 cm irregular mass in the upper inner left breast with adjacent 1.1 cm satellite mass and enlarged left axillary lymph nodes, highly suspicious for left breast malignancy and left axillary lymph node metastases. Tissue sampling is recommended.  No mammographic evidence of right breast malignancy.      ASSESSMENT & PLAN:  54 year old pre-menopausal female with past medical history of hypertension, presented with a palpable left breast mass.  1. Left breast invasive ductal carcinoma, cT2N1Mx, triple negative  -I discussed her imaging finding and biopsy results extensively with patient. Her ultrasound and MRI breast reviewed multiple left axillary lymph nodes, at least  N1 disease, possible N2. I discussed with her and she has locally advanced disease, and triple-negative breast cancer 10 to be more aggressive with early metastasis and cancer recurrence after surgery. -Given the locally advanced disease, I will obtain a CT chest abdomen and pelvis and a bone scan to ruled out distant metastasis -If no evidence of distant metastasis, I recommend neoadjuvant chemotherapy given his very high risk of cancer recurrence after surgery alone. She has no history of cardiac disease, I would recommend dose dense Adriamycin and Cytoxan followed by paclitaxel. -She is going have a port placement next week by Dr. Brantley Stage. -She was also seen by radiation oncologist Dr. Wilburt Finlay worse today and discussed the role of adjuvant radiation after surgery. -Given her young age and triple negative disease, positive  family history, we will refer her to see genetic counselor for genetic testing.  Plan -CT chest, abdomen and pelvis with IV contrast, bone scan -Port placement next week -Echocardiogram -Chemotherapy class -Genetics -I'll see her back after her scans, to finalize her chemotherapy, we will plan to start as soon as possible, likely in a week or 2.  All questions were answered. The patient knows to call the clinic with any problems, questions or concerns. I spent 50 minutes counseling the patient face to face. The total time spent in the appointment was 60 minutes and more than 50% was on counseling.     Truitt Merle, MD 10/12/2014 11:55 AM

## 2014-10-08 NOTE — Progress Notes (Signed)
[place] [facility] [employer] Psychosocial Distress Screening  Patient completed distress screening protocol and scored a 10 on the Psychosocial Distress Thermometer which indicates severe distress. Clinical Social Worker met with patient and patients family friend in [facility] to assess for distress and other psychosocial needs.  Patient presented anxious and stated she was feeling overwhelmed with the amount of information and treatment process.  CSw validated patients feelings and patient discussed common feelings and emotions when diagnosed with cancer.  Patient stated her main concerns and stressor at this time was completing her application for the [employer] and submitting by the deadline.  CSW assisted patient with preparing information to be submitted,and placed in the outgoing mail.  Patient currently lives alone with no income and is supported by her parents.  CSW and patient discussed the financial resources available.  Patient will be scheduled to meet with the financial counselor during her next appointment.  CSW and patient discussed the importance of emotional support during treatment, and  CSW informed patient on the support team and support services at [facility].  CSW provided contact information and encouraged patient to cal with any questions or concerns.        ONCBCN DISTRESS SCREENING 10/08/2014  Screening Type Initial Screening  Distress experienced in past week (1-10) 10  Practical problem type Insurance  Family Problem type Other (comment)  Emotional problem type Depression;Nervousness/Anxiety;Adjusting to illness;Isolation/feeling alone;Feeling hopeless;Adjusting to appearance changes  Spiritual/Religous concerns type Loss of sense of purpose  Physical Problem type Nausea/vomiting;Sleep/insomnia;Loss of appetitie;Constipation/diarrhea  Physician notified of physical symptoms Yes  Referral to clinical psychology No  Referral to clinical social work Yes  Referral  to dietition No  Referral to financial advocate Yes  Referral to support programs Yes  Referral to palliative care No   Johnnye Lana, MSW, LCSW, OSW-C Clinical Social Worker [place] [phone]

## 2014-10-08 NOTE — Telephone Encounter (Signed)
per Keisha/pof to sch fina coun/chemo ed/gen coun-sch & cld pt to adv of appt times & date-pt understood

## 2014-10-09 ENCOUNTER — Other Ambulatory Visit: Payer: 59

## 2014-10-12 ENCOUNTER — Encounter: Payer: Self-pay | Admitting: Hematology

## 2014-10-13 ENCOUNTER — Other Ambulatory Visit: Payer: 59

## 2014-10-13 ENCOUNTER — Ambulatory Visit (HOSPITAL_COMMUNITY)
Admission: RE | Admit: 2014-10-13 | Discharge: 2014-10-13 | Disposition: A | Discharge status: Home / Self Care | Payer: 59 | Source: Ambulatory Visit | Attending: Hematology | Admitting: Hematology

## 2014-10-13 ENCOUNTER — Telehealth: Payer: Self-pay | Admitting: *Deleted

## 2014-10-13 ENCOUNTER — Other Ambulatory Visit: Payer: Self-pay | Admitting: Hematology

## 2014-10-13 DIAGNOSIS — C50212 Malignant neoplasm of upper-inner quadrant of left female breast: Secondary | ICD-10-CM | POA: Insufficient documentation

## 2014-10-13 DIAGNOSIS — Z5111 Encounter for antineoplastic chemotherapy: Secondary | ICD-10-CM

## 2014-10-13 DIAGNOSIS — Z01818 Encounter for other preprocedural examination: Secondary | ICD-10-CM | POA: Diagnosis not present

## 2014-10-13 NOTE — Telephone Encounter (Signed)
Spoke with patient from [facility] 10/08/14.  She is very overwhelmed.  I had mentioned to her that maybe she could get her scans at [facility] [place] earlier than 3/9.  She stated it was her request to have them done after her port placement.  She states she is "scared to death" of having this port put in.  Reassured her but she would like to leave appointments as is.  Informed her I would get with Dr. Burr Medico and discuss and get back with her.

## 2014-10-13 NOTE — Progress Notes (Signed)
  Echocardiogram 2D Echocardiogram has been performed.  Joelene Millin 10/13/2014, 12:02 PM

## 2014-10-16 ENCOUNTER — Encounter: Payer: Self-pay | Admitting: Hematology

## 2014-10-16 ENCOUNTER — Encounter (HOSPITAL_BASED_OUTPATIENT_CLINIC_OR_DEPARTMENT_OTHER): Payer: Self-pay | Admitting: *Deleted

## 2014-10-16 ENCOUNTER — Other Ambulatory Visit: Payer: 59

## 2014-10-16 ENCOUNTER — Other Ambulatory Visit: Payer: Self-pay | Admitting: Hematology

## 2014-10-16 ENCOUNTER — Encounter: Payer: Self-pay | Admitting: Genetic Counselor

## 2014-10-16 ENCOUNTER — Encounter: Payer: Self-pay | Admitting: *Deleted

## 2014-10-16 ENCOUNTER — Ambulatory Visit (HOSPITAL_BASED_OUTPATIENT_CLINIC_OR_DEPARTMENT_OTHER): Payer: 59 | Admitting: Genetic Counselor

## 2014-10-16 ENCOUNTER — Telehealth: Payer: Self-pay | Admitting: *Deleted

## 2014-10-16 ENCOUNTER — Ambulatory Visit: Payer: 59

## 2014-10-16 DIAGNOSIS — C50512 Malignant neoplasm of lower-outer quadrant of left female breast: Secondary | ICD-10-CM

## 2014-10-16 DIAGNOSIS — C773 Secondary and unspecified malignant neoplasm of axilla and upper limb lymph nodes: Secondary | ICD-10-CM | POA: Diagnosis not present

## 2014-10-16 DIAGNOSIS — Z801 Family history of malignant neoplasm of trachea, bronchus and lung: Secondary | ICD-10-CM

## 2014-10-16 DIAGNOSIS — Z803 Family history of malignant neoplasm of breast: Secondary | ICD-10-CM | POA: Insufficient documentation

## 2014-10-16 DIAGNOSIS — Z315 Encounter for genetic counseling: Secondary | ICD-10-CM | POA: Diagnosis not present

## 2014-10-16 DIAGNOSIS — C50212 Malignant neoplasm of upper-inner quadrant of left female breast: Secondary | ICD-10-CM

## 2014-10-16 NOTE — Progress Notes (Signed)
Pt would like to apply for the [employer].  She is currently not working so her parents will write a letter of support.  Once received her funds will be activated.

## 2014-10-16 NOTE — Progress Notes (Signed)
Pt is approved for the $1000 [employer] grant.  

## 2014-10-16 NOTE — Progress Notes (Signed)
Pt to come in for ekg-cbc cmet done 10/08/14

## 2014-10-16 NOTE — Progress Notes (Signed)
Patient Name: Ashlee Hill Patient Age: 54 y.o. Encounter Date: 10/16/2014  Referring Physician: Truitt Merle, MD  Primary Care Provider: Kandice Hams, MD   [name], a 54 y.o. female, is being seen at the [facility] due to a personal and family history of breast cancer. She presents to clinic today to discuss the possibility of a hereditary predisposition to cancer and discuss whether genetic testing is warranted.  HISTORY OF PRESENT ILLNESS: Ashlee Hill was diagnosed with left breast cancer recently at the age of 11. The breast tumor was ER negative, PR negative, and HER2 negative. She stated that the plan is for neoadjuvant chemotherapy before deciding on additional management.    Breast cancer of upper-inner quadrant of left female breast   09/26/2014 Breast US 2.5 cm irregular mass in the upper inner left breast with adjacent 1.1 cm satellite mass and enlarged left axillary lymph nodes, highly suspicious for left breast malignancy and left axillary lymph node metastases. Tissue sampling is recommended.    09/26/2014 Mammogram Spot compression views of the left breast and routine views of both breasts demonstrate a 2 x 2.5 cm irregular mass in the upper inner left breast.    09/29/2014 Initial Biopsy 1. Breast, left, needle core biopsy, mass, 11 o'clock - INVASIVE MAMMARY CARCINOMA, SEE COMMENT. - MAMMARY CARCINOMA IN SITU. 2. Lymph node, needle/core biopsy, left axillary - ONE LYMPH NODE, POSITIVE FOR METASTATIC MAMMARY CARCINOMA (1/1).   09/29/2014 Receptors her2 Estrogen Receptor: 0%, NEGATIVE Progesterone Receptor: 0%, NEGATIVE Proliferation Marker Ki67: 93%   10/01/2014 Initial Diagnosis Breast cancer of upper-inner quadrant of left female breast    Past Medical History  Diagnosis Date  . Breast cancer of upper-inner quadrant of left female breast 10/01/2014  . Hypertension   . Family history of breast cancer     Past Surgical History  Procedure  Laterality Date  . Tonsillectomy    . Wisdom tooth extraction      History   Social History  . Marital Status: Single    Spouse Name: N/A  . Number of Children: N/A  . Years of Education: N/A   Social History Main Topics  . Smoking status: Never Smoker   . Smokeless tobacco: Not on file  . Alcohol Use: 0.6 oz/week    1 Glasses of wine per week     Comment: socail drinker  . Drug Use: No  . Sexual Activity: Not on file   Other Topics Concern  . Not on file   Social History Narrative     FAMILY HISTORY:   During the visit, a 4-generation pedigree was obtained. Family tree will be sent for scanning and will be in EPIC under the Media tab.  Significant diagnoses include the following:  Family History  Problem Relation Age of Onset  . Prostate cancer Father 72    currently 28  . Breast cancer Paternal Aunt 28    deceased 13  . Lung cancer Maternal Grandfather 58    smoker; deceased  . Thyroid cancer Cousin 25    pat first cousin; currently 84    Additionally, Ashlee Hill has no children and no siblings. Her mother (age 89) is cancer-free; she did have a TAH/BSO at age 91 due to prolapse. Her mother had only one brother. Her father had only one sister, as noted above.  Ashlee Hill ancestry is Caucasian-NOS. There is no known Jewish ancestry and no consanguinity.  ASSESSMENT AND PLAN: [name] is a 54  y.o. female with a personal history of triple negative breast cancer and family history of breast cancer at a later age in her only paternal aunt. This history is somewhat suggestive of a hereditary predisposition to cancer given her triple negative breast cancer and very small family. Of note, her mother had a TAH/BSO in her late 28s and she had no sisters. We reviewed the characteristics, features and inheritance patterns of hereditary cancer syndromes. We also discussed genetic testing, including the process of testing, insurance coverage and implications of results. A  negative result will be generally reassuring.  Ashlee Hill wished to pursue genetic testing and a blood sample will be sent to [facility] for analysis of the 17 genes on the BreastNext gene panel (ATM, BARD1, BRCA1, BRCA2, BRIP1, CDH1, CHEK2, MRE11A, MUTYH, NBN, NF1, PALB2, PTEN, RAD50, RAD51C, RAD51D, and TP53). We discussed the implications of a positive, negative and/ or Variant of Uncertain Significance (VUS) result. Results should be available in approximately 4-5 weeks, at which point we will contact her and address implications for her as well as address genetic testing for at-risk family members, if needed.    We encouraged Ms. Yapp to remain in contact with Cancer Genetics annually so that we can update the family history and inform her of any changes in cancer genetics and testing that may be of benefit for this family. Ms.  Bier questions were answered to her satisfaction today.   Thank you for the referral and allowing [place] to share in the care of your patient.   The patient was seen for a total of 30 minutes, greater than 50% of which was spent face-to-face counseling. This patient was discussed with the overseeing provider who agrees with the above.   Steele Berg, MS, [place] Certified Genetic Counselor phone: [phone] [email or web address]

## 2014-10-16 NOTE — Telephone Encounter (Signed)
Spoke with patient and she is now going to get her scans on 2/26 at [facility].  She is aware of appointment times.  She is still really emotional.  Encouraged her to call with any needs or concerns.

## 2014-10-17 ENCOUNTER — Other Ambulatory Visit: Payer: Self-pay | Admitting: *Deleted

## 2014-10-17 ENCOUNTER — Encounter (HOSPITAL_BASED_OUTPATIENT_CLINIC_OR_DEPARTMENT_OTHER)
Admission: RE | Admit: 2014-10-17 | Discharge: 2014-10-17 | Disposition: A | Discharge status: Home / Self Care | Payer: 59 | Source: Ambulatory Visit

## 2014-10-17 ENCOUNTER — Ambulatory Visit (HOSPITAL_COMMUNITY)
Admission: RE | Admit: 2014-10-17 | Discharge: 2014-10-17 | Disposition: A | Discharge status: Home / Self Care | Payer: 59 | Source: Ambulatory Visit | Attending: Hematology | Admitting: Hematology

## 2014-10-17 ENCOUNTER — Encounter (HOSPITAL_COMMUNITY)
Admission: RE | Admit: 2014-10-17 | Discharge: 2014-10-17 | Disposition: A | Discharge status: Home / Self Care | Payer: 59 | Source: Ambulatory Visit | Attending: Hematology | Admitting: Hematology

## 2014-10-17 ENCOUNTER — Encounter (HOSPITAL_COMMUNITY): Payer: Self-pay

## 2014-10-17 ENCOUNTER — Ambulatory Visit (HOSPITAL_COMMUNITY): Payer: 59

## 2014-10-17 ENCOUNTER — Other Ambulatory Visit: Payer: Self-pay

## 2014-10-17 ENCOUNTER — Encounter (HOSPITAL_COMMUNITY): Payer: 59

## 2014-10-17 DIAGNOSIS — C50212 Malignant neoplasm of upper-inner quadrant of left female breast: Secondary | ICD-10-CM

## 2014-10-17 DIAGNOSIS — K760 Fatty (change of) liver, not elsewhere classified: Secondary | ICD-10-CM | POA: Insufficient documentation

## 2014-10-17 DIAGNOSIS — C50912 Malignant neoplasm of unspecified site of left female breast: Secondary | ICD-10-CM | POA: Insufficient documentation

## 2014-10-17 DIAGNOSIS — R590 Localized enlarged lymph nodes: Secondary | ICD-10-CM

## 2014-10-17 DIAGNOSIS — I10 Essential (primary) hypertension: Secondary | ICD-10-CM | POA: Diagnosis not present

## 2014-10-17 DIAGNOSIS — K5730 Diverticulosis of large intestine without perforation or abscess without bleeding: Secondary | ICD-10-CM

## 2014-10-17 DIAGNOSIS — E780 Pure hypercholesterolemia: Secondary | ICD-10-CM | POA: Diagnosis not present

## 2014-10-17 DIAGNOSIS — F419 Anxiety disorder, unspecified: Secondary | ICD-10-CM | POA: Diagnosis not present

## 2014-10-17 DIAGNOSIS — F329 Major depressive disorder, single episode, unspecified: Secondary | ICD-10-CM | POA: Diagnosis not present

## 2014-10-17 MED ORDER — TECHNETIUM TC 99M MEDRONATE IV KIT
25.0000 | PACK | Freq: Once | INTRAVENOUS | Status: AC | PRN
  Administered 2014-10-17: 25 via INTRAVENOUS

## 2014-10-17 MED ORDER — IOHEXOL 300 MG/ML  SOLN
100.0000 mL | Freq: Once | INTRAMUSCULAR | Status: AC | PRN
  Administered 2014-10-17: 100 mL via INTRAVENOUS

## 2014-10-20 ENCOUNTER — Ambulatory Visit (HOSPITAL_COMMUNITY)
Admission: RE | Admit: 2014-10-20 | Discharge: 2014-10-20 | Disposition: A | Discharge status: Home / Self Care | Payer: 59 | Source: Ambulatory Visit | Attending: Hematology | Admitting: Hematology

## 2014-10-20 ENCOUNTER — Ambulatory Visit (HOSPITAL_BASED_OUTPATIENT_CLINIC_OR_DEPARTMENT_OTHER): Payer: 59 | Admitting: Hematology

## 2014-10-20 ENCOUNTER — Encounter: Payer: Self-pay | Admitting: Hematology

## 2014-10-20 ENCOUNTER — Telehealth: Payer: Self-pay | Admitting: *Deleted

## 2014-10-20 ENCOUNTER — Telehealth: Payer: Self-pay | Admitting: Hematology

## 2014-10-20 VITALS — BP 154/92 | HR 99 | Temp 98.6°F | Resp 20 | Ht 65.0 in | Wt 235.8 lb

## 2014-10-20 DIAGNOSIS — C50212 Malignant neoplasm of upper-inner quadrant of left female breast: Secondary | ICD-10-CM | POA: Diagnosis not present

## 2014-10-20 DIAGNOSIS — I10 Essential (primary) hypertension: Secondary | ICD-10-CM | POA: Insufficient documentation

## 2014-10-20 DIAGNOSIS — R948 Abnormal results of function studies of other organs and systems: Secondary | ICD-10-CM | POA: Insufficient documentation

## 2014-10-20 DIAGNOSIS — C50912 Malignant neoplasm of unspecified site of left female breast: Secondary | ICD-10-CM | POA: Diagnosis not present

## 2014-10-20 DIAGNOSIS — E780 Pure hypercholesterolemia: Secondary | ICD-10-CM | POA: Insufficient documentation

## 2014-10-20 DIAGNOSIS — F411 Generalized anxiety disorder: Secondary | ICD-10-CM

## 2014-10-20 DIAGNOSIS — C773 Secondary and unspecified malignant neoplasm of axilla and upper limb lymph nodes: Secondary | ICD-10-CM | POA: Diagnosis not present

## 2014-10-20 DIAGNOSIS — F419 Anxiety disorder, unspecified: Secondary | ICD-10-CM | POA: Insufficient documentation

## 2014-10-20 DIAGNOSIS — F329 Major depressive disorder, single episode, unspecified: Secondary | ICD-10-CM | POA: Insufficient documentation

## 2014-10-20 MED ORDER — ONDANSETRON HCL 8 MG PO TABS: 8.0000 mg | ORAL_TABLET | Freq: Two times a day (BID) | ORAL | Status: DC | PRN

## 2014-10-20 MED ORDER — LIDOCAINE-PRILOCAINE 2.5-2.5 % EX CREA: TOPICAL_CREAM | CUTANEOUS | Status: DC

## 2014-10-20 NOTE — Telephone Encounter (Signed)
gv adn printed appt sched and avs for pt for March...sed added tx. °

## 2014-10-20 NOTE — Telephone Encounter (Signed)
Called pt at home and left message on voice mail re:  X-rays done today results  Negative  As per Dr. Ernestina Penna instructions.  Asked pt to call nurse back to confirm that she received message.

## 2014-10-20 NOTE — Addendum Note (Signed)
Addended by: Truitt Merle on: 10/20/2014 06:29 PM   Modules accepted: Orders

## 2014-10-20 NOTE — Progress Notes (Signed)
[place]  [phone] Fax:([phone]) [place] [facility] Note   Patient Care Team: Kandice Hams, MD as PCP - General (Internal Medicine) Erroll Luna, MD as Consulting Physician (General Surgery) Truitt Merle, MD as Consulting Physician (Hematology) Thea Silversmith, MD as Consulting Physician (Radiation Oncology) Roselee Culver, RN as Registered Nurse [facility], RN as Registered Nurse 10/20/2014  CHIEF COMPLAINTS/PURPOSE OF CONSULTATION:  Newly diagnosed breast cancer    Breast cancer of upper-inner quadrant of left female breast   09/26/2014 Breast US 2.5 cm irregular mass in the upper inner left breast with adjacent 1.1 cm satellite mass and enlarged left axillary lymph nodes, highly suspicious for left breast malignancy and left axillary lymph node metastases. Tissue sampling is recommended.    09/26/2014 Mammogram Spot compression views of the left breast and routine views of both breasts demonstrate a 2 x 2.5 cm irregular mass in the upper inner left breast.    09/29/2014 Initial Biopsy 1. Breast, left, needle core biopsy, mass, 11 o'clock - INVASIVE MAMMARY CARCINOMA, SEE COMMENT. - MAMMARY CARCINOMA IN SITU. 2. Lymph node, needle/core biopsy, left axillary - ONE LYMPH NODE, POSITIVE FOR METASTATIC MAMMARY CARCINOMA (1/1).   09/29/2014 Receptors her2 Estrogen Receptor: 0%, NEGATIVE Progesterone Receptor: 0%, NEGATIVE Proliferation Marker Ki67: 93%   10/01/2014 Initial Diagnosis Breast cancer of upper-inner quadrant of left female breast     HISTORY OF PRESENTING ILLNESS:  Ashlee Hill 54 y.o. female presents to our multidisciplinary breast clinic today to discuss the management of her newly diagnosed breast cancer  She noticed a left breast lump in August 2015, no tenderness, skin or nipple change. She otherwise felt well. She did not seek immediate medical attention due to lack of insurance. She finally got her insurance  approved and saw her primary care physician recently. She was referred for mammogram which showed a 2.5 cm irregular mass in the upper inner left breast. She underwent left breast mass and axillary node biopsy on 09/29/2014 and both biopsy showed invasive ductal carcinoma, ER negative, PR negative, HER-2 negative.  She otherwise feels well, no pain or ther complains. She has good appetite and her weight is stable.  INTERIM HISTORY; Ashlee Hill returns for follow-up and review scan results. She has been quite nervous and anxious about the scan results. She does not sleep well. She denies any significant pain, she had no prior history of fracture or injury to the arm or low back.  MEDICAL HISTORY:  Past Medical History  Diagnosis Date  . Breast cancer of upper-inner quadrant of left female breast 10/01/2014  . Hypertension   . Family history of breast cancer   . Wears glasses     driving    SURGICAL HISTORY: Past Surgical History  Procedure Laterality Date  . Tonsillectomy    . Wisdom tooth extraction       GYN HISTORY  Menarchal: 12 LMP: 09/27/2014 Contraceptive: no HRT:  G0P0    SOCIAL HISTORY: History   Social History  . Marital Status: Single    Spouse Name: N/A  . Number of Children: N/A  . Years of Education: N/A   Occupational History  . Not on file.   Social History Main Topics  . Smoking status: Never Smoker   . Smokeless tobacco: Not on file  . Alcohol Use: 0.6 oz/week    1 Glasses of wine per week     Comment: socail drinker  . Drug Use: No  . Sexual Activity:  Not on file   Other Topics Concern  . Not on file   Social History Narrative    FAMILY HISTORY: Family History  Problem Relation Age of Onset  . Prostate cancer Father 34    currently 43  . Breast cancer Paternal Aunt 73    deceased 16  . Lung cancer Maternal Grandfather 77    smoker; deceased  . Thyroid cancer Cousin 87    pat first cousin; currently 41     Father had prostate cancer  at age of 41 Paternal aunt had breast caner in her 8's Paternal cousin had thyroid cancer at age of before 3 Maternal grandfather had lung cancer    ALLERGIES:  has No Known Allergies.  MEDICATIONS:  Current Outpatient Prescriptions  Medication Sig Dispense Refill  . ALPRAZolam (XANAX) 1 MG tablet Take 1 mg by mouth as needed for anxiety.    Marland Kitchen atorvastatin (LIPITOR) 10 MG tablet Take 10 mg by mouth daily.    . calcium carbonate (OS-CAL) 600 MG TABS tablet Take 600 mg by mouth daily with breakfast.    . gabapentin (NEURONTIN) 100 MG capsule Take 100 mg by mouth as needed.    . hydrochlorothiazide (HYDRODIURIL) 25 MG tablet TAKE 1 TABLET DAILY    . LORazepam (ATIVAN) 1 MG tablet Take 1 tablet (1 mg total) by mouth at bedtime as needed for sleep. 30 tablet 0  . metoprolol (LOPRESSOR) 100 MG tablet Take 100 mg by mouth daily.    Marland Kitchen venlafaxine XR (EFFEXOR-XR) 150 MG 24 hr capsule Take 150 mg by mouth daily with breakfast.    . aspirin 81 MG tablet Take 81 mg by mouth daily.     No current facility-administered medications for this visit.    REVIEW OF SYSTEMS:   Constitutional: Denies fevers, chills or abnormal night sweats Eyes: Denies blurriness of vision, double vision or watery eyes Ears, nose, mouth, throat, and face: Denies mucositis or sore throat Respiratory: Denies cough, dyspnea or wheezes Cardiovascular: Denies palpitation, chest discomfort or lower extremity swelling Gastrointestinal:  Denies nausea, heartburn or change in bowel habits Skin: Denies abnormal skin rashes Lymphatics: Denies new lymphadenopathy or easy bruising Neurological:Denies numbness, tingling or new weaknesses Behavioral/Psych: Mood is stable, no new changes  All other systems were reviewed with the patient and are negative.  PHYSICAL EXAMINATION: ECOG PERFORMANCE STATUS: 0 - Asymptomatic  Filed Vitals:   10/20/14 1445  BP: 154/92  Pulse: 99  Temp:   Resp:    Filed Weights   10/20/14 1430    Weight: 235 lb 12.8 oz (106.958 kg)    GENERAL:alert, no distress and comfortable SKIN: skin color, texture, turgor are normal, no rashes or significant lesions EYES: normal, conjunctiva are pink and non-injected, sclera clear OROPHARYNX:no exudate, no erythema and lips, buccal mucosa, and tongue normal  NECK: supple, thyroid normal size, non-tender, without nodularity LYMPH:  no palpable lymphadenopathy in the cervical, axillary or inguinal LUNGS: clear to auscultation and percussion with normal breathing effort HEART: regular rate & rhythm and no murmurs and no lower extremity edema ABDOMEN:abdomen soft, non-tender and normal bowel sounds Musculoskeletal:no cyanosis of digits and no clubbing  PSYCH: alert & oriented x 3 with fluent speech NEURO: no focal motor/sensory deficits Breasts: Breast inspection showed them to be symmetrical with no nipple discharge. Palpation of the left breasts showed a 5X3.5cm mass in the upper outer quadrant, no palpable axillary lymph node. Exam of the right breast and axillary was normal.    LABORATORY  DATA:  I have reviewed the data as listed Lab Results  Component Value Date   WBC 10.7* 10/08/2014   HGB 15.5 10/08/2014   HCT 44.3 10/08/2014   MCV 97.4 10/08/2014   PLT 175 10/08/2014    Recent Labs  10/08/14 0833  NA 136  K 4.4  CO2 21*  GLUCOSE 144*  BUN 15.2  CREATININE 0.9  CALCIUM 9.2  PROT 7.3  ALBUMIN 3.8  AST 25  ALT 25  ALKPHOS 98  BILITOT 0.52    PATHOLOGY REPORT 09/26/2014 Diagnosis 1. Breast, left, needle core biopsy, mass, 11 o'clock - INVASIVE MAMMARY CARCINOMA, SEE COMMENT. - MAMMARY CARCINOMA IN SITU. 2. Lymph node, needle/core biopsy, left axillary - ONE LYMPH NODE, POSITIVE FOR METASTATIC MAMMARY CARCINOMA (1/1). Microscopic Comment 1. Although grade of tumor is best assessed at resection, with these biopsies, both the in situ and invasive carcinoma are grade II. The invasive carcinoma demonstrates strong  diffuse E-cadherin expression; supporting a ductal phenotype. 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  Estrogen Receptor: 0%, NEGATIVE Progesterone Receptor: 0%, NEGATIVE Proliferation Marker Ki67: 93% 1. A sample was sent to [facility] for HER-2 testing by FISH. The results are as follows: Negative.  RADIOGRAPHIC STUDIES: I have personally reviewed the radiological images as listed and agreed with the findings in the report.  Mr Breast Bilateral W Wo Contrast 10/08/2014    IMPRESSION: 1. Biopsy proven malignancy in the upper inner left breast with adjacent/contiguous areas of nodularity measures up to 3.5 cm, with multiple morphologically abnormal axillary lymph nodes compatible with known axillary metastases.  2. No MRI evidence of malignancy in the right breast.  RECOMMENDATION: Treatment plan for known left breast malignancy.  BI-RADS CATEGORY  6: Known biopsy-proven malignancy.   Electronically Signed   By: Everlean Alstrom M.D.   On: 10/08/2014 10:29   US Breast Ltd Uni Left Inc Axilla 09/26/2014    IMPRESSION: 2.5 cm irregular mass in the upper inner left breast with adjacent 1.1 cm satellite mass and enlarged left axillary lymph nodes, highly suspicious for left breast malignancy and left axillary lymph node metastases. Tissue sampling is recommended.  No mammographic evidence of right breast malignancy.    CT chest, abdomen and pelvis with contrast 10/17/2014 IMPRESSION: 1. Left breast lesion is identified compatible with the clinical history of breast cancer. 2. Enlarged left axillary lymph nodes suspicious for metastatic adenopathy. 3. No specific features identified to suggest distant metastatic disease. 4. Hepatic steatosis.  Bone scan 10/17/2014 IMPRESSION: 1. The uptake pattern within the skeleton is not highly suspicious for malignancy.  However, subtle increased uptake in the midshaft of the right humerus and the lower lumbar spine posteriorly merits further evaluation with plain films. 2. The uptake over the lower extremities is consistent with degenerative change.  Lumbar spine X-ray 10/20/14 IMPRESSION: No lytic or sclerotic osseous lesion.  Probable degenerative facet osteoarthritic change at L4-L5 and L5-S1 which may correspond to the area of abnormal uptake at recent bone scan.  RIGHT HUMERUS X-RAY 10/20/2014 IMPRESSION: No abnormality seen to correspond to abnormal uptake seen on bone scan   ASSESSMENT & PLAN:  54 year old pre-menopausal female with past medical history of hypertension, presented with a palpable left breast mass.  1. Left breast invasive ductal carcinoma, cT2N1M0, stage IIB, triple negative  -I discussed her all imaging finding and biopsy results extensively with patient. Her ultrasound and MRI breast  reviewed multiple left axillary lymph nodes, at least N1 disease, possible N2. I discussed with her and she has locally advanced disease, and triple-negative breast cancer 10 to be more aggressive with early metastasis and cancer recurrence after surgery. -a staging CT showed no evidence of distant metastasis, the subtle increased uptake in the right humerus and lower lumbar spine on the bone scan was further evaluated by x-ray today, which were negative. - I recommend neoadjuvant chemotherapy given his very high risk of cancer recurrence after surgery alone. -She has no history of cardiac disease, normal EF on echo,  I would recommend dose dense Adriamycin and Cytoxan followed by paclitaxel. -Potential side effects of chemotherapy, which includes but not limited to, fatigue, nausea, vomiting, diarrhea, neuropathy, heart failure, alopecia, neuropathy, secondary malignancy, we explained to patient in great details. Chemotherapy consents obtained today. -She is going have a port placement tomorrow by  Dr. Brantley Stage. -Given her young age and triple negative disease, positive family history, she was referred to see genetic counselor for genetic testing, her genetic testing results are still pending. -If she has positive BRCA1/2 mutation, she will be a good candidate for the NSABP B-55 clinical trial.  2. Infertility -We discussed she may experience menopause with chemotherapy, the potentially not able to be pregnant afterwards. -She does not desire to have children in the future.  3.Anxiety and coping -she will take her xanax needed -I encouraged her to think positive -She has good support from her parents. She is single  Plan -I'll tentatively schedule her to start chemotherapy on his Friday with dose dense AC, every 2 weeks for 4 cycles, with Neulasta support on day 2, followed by weekly paclitaxel for 12 weeks. -I'll see her back next week for toxicity check up with her first cycle chemotherapy.  All questions were answered. The patient knows to call the clinic with any problems, questions or concerns. I spent 30 minutes counseling the patient face to face. The total time spent in the appointment was 40 minutes and more than 50% was on counseling.     Truitt Merle, MD 10/20/2014 2:59 PM

## 2014-10-21 ENCOUNTER — Encounter (HOSPITAL_BASED_OUTPATIENT_CLINIC_OR_DEPARTMENT_OTHER): Payer: Self-pay | Admitting: *Deleted

## 2014-10-21 ENCOUNTER — Ambulatory Visit (HOSPITAL_BASED_OUTPATIENT_CLINIC_OR_DEPARTMENT_OTHER): Payer: 59 | Admitting: Anesthesiology

## 2014-10-21 ENCOUNTER — Ambulatory Visit (HOSPITAL_BASED_OUTPATIENT_CLINIC_OR_DEPARTMENT_OTHER)
Admission: RE | Admit: 2014-10-21 | Discharge: 2014-10-21 | Disposition: A | Discharge status: Home / Self Care | Payer: 59 | Source: Ambulatory Visit | Attending: Surgery | Admitting: Surgery

## 2014-10-21 ENCOUNTER — Ambulatory Visit (HOSPITAL_COMMUNITY): Payer: 59

## 2014-10-21 ENCOUNTER — Encounter (HOSPITAL_COMMUNITY): Payer: 59

## 2014-10-21 ENCOUNTER — Encounter (HOSPITAL_BASED_OUTPATIENT_CLINIC_OR_DEPARTMENT_OTHER)
Admission: RE | Disposition: A | Discharge status: Home / Self Care | Payer: Self-pay | Source: Ambulatory Visit | Attending: Surgery

## 2014-10-21 DIAGNOSIS — Z95828 Presence of other vascular implants and grafts: Secondary | ICD-10-CM

## 2014-10-21 DIAGNOSIS — C50912 Malignant neoplasm of unspecified site of left female breast: Secondary | ICD-10-CM | POA: Diagnosis not present

## 2014-10-21 HISTORY — DX: Presence of spectacles and contact lenses: Z97.3

## 2014-10-21 HISTORY — PX: PORTACATH PLACEMENT: SHX2246

## 2014-10-21 LAB — POCT HEMOGLOBIN-HEMACUE: Hemoglobin: 14.6 g/dL (ref 12.0–15.0)

## 2014-10-21 SURGERY — INSERTION, TUNNELED CENTRAL VENOUS DEVICE, WITH PORT
Anesthesia: General | Site: Chest | Laterality: Right

## 2014-10-21 MED ORDER — MIDAZOLAM HCL 2 MG/2ML IJ SOLN
INTRAMUSCULAR | Status: AC
  Filled 2014-10-21: qty 2

## 2014-10-21 MED ORDER — CEFAZOLIN SODIUM-DEXTROSE 2-3 GM-% IV SOLR
2.0000 g | INTRAVENOUS | Status: AC
  Administered 2014-10-21: 2 g via INTRAVENOUS

## 2014-10-21 MED ORDER — ONDANSETRON HCL 4 MG/2ML IJ SOLN
INTRAMUSCULAR | Status: DC | PRN
  Administered 2014-10-21: 4 mg via INTRAVENOUS

## 2014-10-21 MED ORDER — FENTANYL CITRATE 0.05 MG/ML IJ SOLN
INTRAMUSCULAR | Status: DC | PRN
  Administered 2014-10-21: 100 ug via INTRAVENOUS

## 2014-10-21 MED ORDER — LACTATED RINGERS IV SOLN
INTRAVENOUS | Status: DC
  Administered 2014-10-21 (×2): via INTRAVENOUS

## 2014-10-21 MED ORDER — LIDOCAINE HCL (CARDIAC) 20 MG/ML IV SOLN
INTRAVENOUS | Status: DC | PRN
  Administered 2014-10-21: 50 mg via INTRAVENOUS

## 2014-10-21 MED ORDER — FENTANYL CITRATE 0.05 MG/ML IJ SOLN
INTRAMUSCULAR | Status: AC
  Filled 2014-10-21: qty 2

## 2014-10-21 MED ORDER — BUPIVACAINE-EPINEPHRINE (PF) 0.25% -1:200000 IJ SOLN
INTRAMUSCULAR | Status: AC
  Filled 2014-10-21: qty 30

## 2014-10-21 MED ORDER — PROPOFOL 10 MG/ML IV BOLUS
INTRAVENOUS | Status: DC | PRN
  Administered 2014-10-21: 200 mg via INTRAVENOUS

## 2014-10-21 MED ORDER — DEXAMETHASONE SODIUM PHOSPHATE 4 MG/ML IJ SOLN
INTRAMUSCULAR | Status: DC | PRN
  Administered 2014-10-21: 10 mg via INTRAVENOUS

## 2014-10-21 MED ORDER — FENTANYL CITRATE 0.05 MG/ML IJ SOLN: 50.0000 ug | INTRAMUSCULAR | Status: DC | PRN

## 2014-10-21 MED ORDER — HEPARIN SOD (PORK) LOCK FLUSH 100 UNIT/ML IV SOLN
INTRAVENOUS | Status: DC | PRN
  Administered 2014-10-21: 500 [IU] via INTRAVENOUS

## 2014-10-21 MED ORDER — HEPARIN (PORCINE) IN NACL 2-0.9 UNIT/ML-% IJ SOLN
INTRAMUSCULAR | Status: DC | PRN
  Administered 2014-10-21: 1 via INTRAVENOUS

## 2014-10-21 MED ORDER — HEPARIN SOD (PORK) LOCK FLUSH 100 UNIT/ML IV SOLN
INTRAVENOUS | Status: AC
  Filled 2014-10-21: qty 5

## 2014-10-21 MED ORDER — OXYCODONE-ACETAMINOPHEN 5-325 MG PO TABS: 1.0000 | ORAL_TABLET | ORAL | Status: DC | PRN

## 2014-10-21 MED ORDER — HEPARIN (PORCINE) IN NACL 2-0.9 UNIT/ML-% IJ SOLN
INTRAMUSCULAR | Status: AC
  Filled 2014-10-21: qty 500

## 2014-10-21 MED ORDER — CEFAZOLIN SODIUM-DEXTROSE 2-3 GM-% IV SOLR
INTRAVENOUS | Status: AC
  Filled 2014-10-21: qty 50

## 2014-10-21 MED ORDER — MIDAZOLAM HCL 5 MG/5ML IJ SOLN
INTRAMUSCULAR | Status: DC | PRN
  Administered 2014-10-21: 2 mg via INTRAVENOUS

## 2014-10-21 MED ORDER — CHLORHEXIDINE GLUCONATE 4 % EX LIQD: 1.0000 "application " | Freq: Once | CUTANEOUS | Status: DC

## 2014-10-21 MED ORDER — MIDAZOLAM HCL 2 MG/2ML IJ SOLN: 1.0000 mg | INTRAMUSCULAR | Status: DC | PRN

## 2014-10-21 SURGICAL SUPPLY — 60 items
APL SKNCLS STERI-STRIP NONHPOA (GAUZE/BANDAGES/DRESSINGS)
BAG DECANTER FOR FLEXI CONT (MISCELLANEOUS) ×2 IMPLANT
BENZOIN TINCTURE PRP APPL 2/3 (GAUZE/BANDAGES/DRESSINGS) IMPLANT
BLADE HEX COATED 2.75 (ELECTRODE) ×2 IMPLANT
BLADE SURG 11 STRL SS (BLADE) ×2 IMPLANT
BLADE SURG 15 STRL LF DISP TIS (BLADE) ×1 IMPLANT
BLADE SURG 15 STRL SS (BLADE) ×2
CANISTER SUCT 1200ML W/VALVE (MISCELLANEOUS) IMPLANT
CHLORAPREP W/TINT 26ML (MISCELLANEOUS) ×2 IMPLANT
COVER BACK TABLE 60X90IN (DRAPES) ×2 IMPLANT
COVER MAYO STAND STRL (DRAPES) ×2 IMPLANT
COVER PROBE 5X48 (MISCELLANEOUS) ×2
DECANTER SPIKE VIAL GLASS SM (MISCELLANEOUS) IMPLANT
DRAPE C-ARM 42X72 X-RAY (DRAPES) ×2 IMPLANT
DRAPE LAPAROSCOPIC ABDOMINAL (DRAPES) ×2 IMPLANT
DRAPE UTILITY XL STRL (DRAPES) ×2 IMPLANT
DRSG TEGADERM 2-3/8X2-3/4 SM (GAUZE/BANDAGES/DRESSINGS) IMPLANT
ELECT REM PT RETURN 9FT ADLT (ELECTROSURGICAL) ×2
ELECTRODE REM PT RTRN 9FT ADLT (ELECTROSURGICAL) ×1 IMPLANT
GEL ULTRASOUND 8.5O AQUASONIC (MISCELLANEOUS) ×2 IMPLANT
GLOVE BIOGEL PI IND STRL 7.0 (GLOVE) IMPLANT
GLOVE BIOGEL PI IND STRL 8 (GLOVE) ×1 IMPLANT
GLOVE BIOGEL PI INDICATOR 7.0 (GLOVE) ×1
GLOVE BIOGEL PI INDICATOR 8 (GLOVE) ×1
GLOVE ECLIPSE 6.5 STRL STRAW (GLOVE) ×1 IMPLANT
GLOVE ECLIPSE 8.0 STRL XLNG CF (GLOVE) ×2 IMPLANT
GOWN STRL REUS W/ TWL LRG LVL3 (GOWN DISPOSABLE) ×2 IMPLANT
GOWN STRL REUS W/TWL LRG LVL3 (GOWN DISPOSABLE) ×4
IV KIT MINILOC 20X1 SAFETY (NEEDLE) IMPLANT
KIT CVR 48X5XPRB PLUP LF (MISCELLANEOUS) ×1 IMPLANT
KIT PORT POWER 8FR ISP CVUE (Catheter) ×1 IMPLANT
LIQUID BAND (GAUZE/BANDAGES/DRESSINGS) ×2 IMPLANT
NDL HYPO 25X1 1.5 SAFETY (NEEDLE) ×1 IMPLANT
NDL SAFETY ECLIPSE 18X1.5 (NEEDLE) IMPLANT
NDL SPNL 22GX3.5 QUINCKE BK (NEEDLE) IMPLANT
NEEDLE HYPO 18GX1.5 SHARP (NEEDLE)
NEEDLE HYPO 22GX1.5 SAFETY (NEEDLE) IMPLANT
NEEDLE HYPO 25X1 1.5 SAFETY (NEEDLE) ×2 IMPLANT
NEEDLE SPNL 22GX3.5 QUINCKE BK (NEEDLE) IMPLANT
PACK BASIN DAY SURGERY FS (CUSTOM PROCEDURE TRAY) ×2 IMPLANT
PENCIL BUTTON HOLSTER BLD 10FT (ELECTRODE) ×2 IMPLANT
SET SHEATH INTRODUCER 10FR (MISCELLANEOUS) IMPLANT
SHEATH COOK PEEL AWAY SET 9F (SHEATH) IMPLANT
SLEEVE SCD COMPRESS KNEE MED (MISCELLANEOUS) IMPLANT
SPONGE GAUZE 4X4 12PLY STER LF (GAUZE/BANDAGES/DRESSINGS) IMPLANT
SPONGE LAP 4X18 X RAY DECT (DISPOSABLE) ×1 IMPLANT
STRIP CLOSURE SKIN 1/2X4 (GAUZE/BANDAGES/DRESSINGS) IMPLANT
SUT MON AB 4-0 PC3 18 (SUTURE) ×2 IMPLANT
SUT PROLENE 2 0 CT2 30 (SUTURE) IMPLANT
SUT PROLENE 2 0 SH DA (SUTURE) ×2 IMPLANT
SUT SILK 2 0 TIES 17X18 (SUTURE)
SUT SILK 2-0 18XBRD TIE BLK (SUTURE) IMPLANT
SUT VIC AB 3-0 SH 27 (SUTURE) ×2
SUT VIC AB 3-0 SH 27X BRD (SUTURE) ×1 IMPLANT
SYR 5ML LUER SLIP (SYRINGE) ×2 IMPLANT
SYR CONTROL 10ML LL (SYRINGE) ×2 IMPLANT
TOWEL OR 17X24 6PK STRL BLUE (TOWEL DISPOSABLE) ×3 IMPLANT
TOWEL OR NON WOVEN STRL DISP B (DISPOSABLE) ×2 IMPLANT
TUBE CONNECTING 20X1/4 (TUBING) IMPLANT
YANKAUER SUCT BULB TIP NO VENT (SUCTIONS) IMPLANT

## 2014-10-21 NOTE — Anesthesia Postprocedure Evaluation (Signed)
Anesthesia Post Note  Patient: Ashlee Hill  Procedure(s) Performed: Procedure(s) (LRB): INSERTION PORT-A-CATH (Right)  Anesthesia type: General  Patient location: PACU  Post pain: Pain level controlled and Adequate analgesia  Post assessment: Post-op Vital signs reviewed, Patient's Cardiovascular Status Stable, Respiratory Function Stable, Patent Airway and Pain level controlled  Last Vitals:  Filed Vitals:   10/21/14 1600  BP:   Pulse: 80  Temp:   Resp: 21    Post vital signs: Reviewed and stable  Level of consciousness: awake, alert  and oriented  Complications: No apparent anesthesia complications

## 2014-10-21 NOTE — Anesthesia Preprocedure Evaluation (Signed)
Anesthesia Evaluation  Patient identified by MRN, date of birth, ID band Patient awake    Reviewed: Allergy & Precautions, NPO status , Patient's Chart, lab work & pertinent test results  Airway Mallampati: II   Neck ROM: full    Dental   Pulmonary neg pulmonary ROS,          Cardiovascular hypertension,     Neuro/Psych    GI/Hepatic   Endo/Other  Morbid obesity  Renal/GU      Musculoskeletal   Abdominal   Peds  Hematology   Anesthesia Other Findings   Reproductive/Obstetrics                             Anesthesia Physical Anesthesia Plan  ASA: II  Anesthesia Plan: General   Post-op Pain Management:    Induction: Intravenous  Airway Management Planned: LMA  Additional Equipment:   Intra-op Plan:   Post-operative Plan:   Informed Consent: I have reviewed the patients History and Physical, chart, labs and discussed the procedure including the risks, benefits and alternatives for the proposed anesthesia with the patient or authorized representative who has indicated his/her understanding and acceptance.     Plan Discussed with: CRNA, Anesthesiologist and Surgeon  Anesthesia Plan Comments:         Anesthesia Quick Evaluation

## 2014-10-21 NOTE — Telephone Encounter (Signed)
PT. RETURNED THU BRAY,RN'S CALL. SHE RECEIVED HER MESSAGE.

## 2014-10-21 NOTE — Discharge Instructions (Signed)
Implanted Port Home Guide °An implanted port is a type of central line that is placed under the skin. Central lines are used to provide IV access when treatment or nutrition needs to be given through a person's veins. Implanted ports are used for long-term IV access. An implanted port may be placed because:  °· You need IV medicine that would be irritating to the small veins in your hands or arms.   °· You need long-term IV medicines, such as antibiotics.   °· You need IV nutrition for a long period.   °· You need frequent blood draws for lab tests.   °· You need dialysis.   °Implanted ports are usually placed in the chest area, but they can also be placed in the upper arm, the abdomen, or the leg. An implanted port has two main parts:  °· Reservoir. The reservoir is round and will appear as a small, raised area under your skin. The reservoir is the part where a needle is inserted to give medicines or draw blood.   °· Catheter. The catheter is a thin, flexible tube that extends from the reservoir. The catheter is placed into a large vein. Medicine that is inserted into the reservoir goes into the catheter and then into the vein.   °HOW WILL I CARE FOR MY INCISION SITE? °Do not get the incision site wet. Bathe or shower as directed by your health care provider.  °HOW IS MY PORT ACCESSED? °Special steps must be taken to access the port:  °· Before the port is accessed, a numbing cream can be placed on the skin. This helps numb the skin over the port site.   °· Your health care provider uses a sterile technique to access the port. °· Your health care provider must put on a mask and sterile gloves. °· The skin over your port is cleaned carefully with an antiseptic and allowed to dry. °· The port is gently pinched between sterile gloves, and a needle is inserted into the port. °· Only "non-coring" port needles should be used to access the port. Once the port is accessed, a blood return should be checked. This helps  ensure that the port is in the vein and is not clogged.   °· If your port needs to remain accessed for a constant infusion, a clear (transparent) bandage will be placed over the needle site. The bandage and needle will need to be changed every week, or as directed by your health care provider.   °· Keep the bandage covering the needle clean and dry. Do not get it wet. Follow your health care provider's instructions on how to take a shower or bath while the port is accessed.   °· If your port does not need to stay accessed, no bandage is needed over the port.   °WHAT IS FLUSHING? °Flushing helps keep the port from getting clogged. Follow your health care provider's instructions on how and when to flush the port. Ports are usually flushed with saline solution or a medicine called heparin. The need for flushing will depend on how the port is used.  °· If the port is used for intermittent medicines or blood draws, the port will need to be flushed:   °· After medicines have been given.   °· After blood has been drawn.   °· As part of routine maintenance.   °· If a constant infusion is running, the port may not need to be flushed.   °HOW LONG WILL MY PORT STAY IMPLANTED? °The port can stay in for as long as your health care   provider thinks it is needed. When it is time for the port to come out, surgery will be done to remove it. The procedure is similar to the one performed when the port was put in.  WHEN SHOULD I SEEK IMMEDIATE MEDICAL CARE? When you have an implanted port, you should seek immediate medical care if:   You notice a bad smell coming from the incision site.   You have swelling, redness, or drainage at the incision site.   You have more swelling or pain at the port site or the surrounding area.   You have a fever that is not controlled with medicine. Document Released: 08/08/2005 Document Revised: 05/29/2013 Document Reviewed: 04/15/2013 [facility] Patient Information 2015 [place], [place]. This  information is not intended to replace advice given to you by your health care provider. Make sure you discuss any questions you have with your health care provider.   Post Anesthesia Home Care Instructions  Activity: Get plenty of rest for the remainder of the day. A responsible adult should stay with you for 24 hours following the procedure.  For the next 24 hours, DO NOT: -Drive a car -Paediatric nurse -Drink alcoholic beverages -Take any medication unless instructed by your physician -Make any legal decisions or sign important papers.  Meals: Start with liquid foods such as gelatin or soup. Progress to regular foods as tolerated. Avoid greasy, spicy, heavy foods. If nausea and/or vomiting occur, drink only clear liquids until the nausea and/or vomiting subsides. Call your physician if vomiting continues.  Special Instructions/Symptoms: Your throat may feel dry or sore from the anesthesia or the breathing tube placed in your throat during surgery. If this causes discomfort, gargle with warm salt water. The discomfort should disappear within 24 hours.

## 2014-10-21 NOTE — Anesthesia Procedure Notes (Signed)
Procedure Name: LMA Insertion Date/Time: 10/21/2014 2:56 PM Performed by: Kathlyn Leachman Pre-anesthesia Checklist: Patient identified, Emergency Drugs available, Suction available and Patient being monitored Patient Re-evaluated:Patient Re-evaluated prior to inductionOxygen Delivery Method: Circle System Utilized Preoxygenation: Pre-oxygenation with 100% oxygen Intubation Type: IV induction Ventilation: Mask ventilation without difficulty LMA: LMA inserted LMA Size: 4.0 Number of attempts: 1 Airway Equipment and Method: Bite block Placement Confirmation: positive ETCO2 Tube secured with: Tape Dental Injury: Teeth and Oropharynx as per pre-operative assessment

## 2014-10-21 NOTE — Transfer of Care (Signed)
Immediate Anesthesia Transfer of Care Note  Patient: Ashlee Hill  Procedure(s) Performed: Procedure(s): INSERTION PORT-A-CATH (Right)  Patient Location: PACU  Anesthesia Type:General  Level of Consciousness: awake, alert , oriented and patient cooperative  Airway & Oxygen Therapy: Patient Spontanous Breathing and Patient connected to face mask oxygen  Post-op Assessment: Report given to RN and Post -op Vital signs reviewed and stable  Post vital signs: Reviewed and stable  Last Vitals:  Filed Vitals:   10/21/14 1303  BP: 194/97  Pulse: 90  Temp: 36.8 C  Resp: 18    Complications: No apparent anesthesia complications

## 2014-10-21 NOTE — H&P (View-Only) (Signed)
Ashlee Hill 10/08/2014 7:46 AM Location: [facility] Patient #: [id number] DOB: [birth year] Undefined / Language: Ashlee Hill / Race: Undefined Female History of Present Illness Ashlee Moores A. Zacharey Jensen MD; 10/08/2014 2:34 PM) Patient words: Pt presents to [facility] at the request of Dr Pablo Ledger for left breast cancer. Pt has 2.5 cm mass. Found by patient last september. Left upper outer breast. Not tender and no discharge.          CLINICAL DATA: 54 year old female with palpable mass in the upper inner left breast discovered on self-examination. Also for annual bilateral mammograms. EXAM: DIGITAL DIAGNOSTIC BILATERAL MAMMOGRAM WITH CAD ULTRASOUND LEFT BREAST COMPARISON: None ACR Breast Density Category b: There are scattered areas of fibroglandular density. FINDINGS: Spot compression views of the left breast and routine views of both breasts demonstrate a 2 x 2.5 cm irregular mass in the upper inner left breast. Mildly prominent left axillary lymph nodes are noted. No suspicious findings in the right breast identified. Mammographic images were processed with CAD. On physical exam, a firm fixed area of thickening is identified at the 11 o'clock position of the left breast 6 cm from the nipple. Targeted ultrasound is performed, showing a 1.9 x 2.5 x 2.3 cm irregular hypoechoic mass at the 11 o'clock position of the left breast 6 cm from the nipple. An adjacent 1.1 x 0.5 x 0.4 cm irregular hypoechoic mass is noted, 4 mm anterior and lateral to the larger irregular mass. Both of these masses and compress an area measuring 3 cm. Enlarged level 1 left axillary lymph nodes are identified. IMPRESSION: 2.5 cm irregular mass in the upper inner left breast with adjacent 1.1 cm satellite mass and enlarged left axillary lymph nodes, highly suspicious for left breast malignancy and left axillary lymph node metastases. Tissue sampling is recommended. No mammographic evidence of  right breast malignancy. RECOMMENDATION: Ultrasound-guided biopsies of dominant upper inner left breast mass and an enlarged left axillary lymph node. These procedures will be performed today but dictated in a separate report. I have discussed the findings and recommendations with the patient. Results were also provided in writing at the conclusion of the visit. If applicable, a reminder letter will be sent to the patient regarding the next appointment. BI-RADS CATEGORY 5: Highly suggestive of malignancy. Electronically Signed By: Ashlee Hill M.D. On: 09/26/2014 13:04   External Result Report  ADDITIONAL INFORMATION: 1. A sample was sent to [facility] for HER-2 testing by FISH. The results are as follows: Negative. (Ashlee Hill:ecj 10/08/2014) Ashlee Cutter MD Pathologist, Electronic Signature ( Signed 10/08/2014) 1. PROGNOSTIC INDICATORS - ACIS Results: IMMUNOHISTOCHEMICAL AND MORPHOMETRIC ANALYSIS BY THE AUTOMATED CELLULAR IMAGING SYSTEM (ACIS) Estrogen Receptor: 0%, NEGATIVE Progesterone Receptor: 0%, NEGATIVE Proliferation Marker Ki67: 93% COMMENT: The negative hormone receptor study(ies) in this case have an internal positive control. REFERENCE RANGE ESTROGEN RECEPTOR NEGATIVE <1% POSITIVE =>1% PROGESTERONE RECEPTOR NEGATIVE <1% POSITIVE =>1% All controls stained appropriately Ashlee Cutter MD Pathologist, Electronic Signature ( Signed 10/01/2014) 1 of 3 FINAL for Ashlee Hill ([id number]) ADDITIONAL INFORMATION:(continued) 1. The invasive carcinoma demonstrates strong diffuse E-cadherin expression; supporting a ductal phenotype. 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 (CRR:gt, 09/30/14) This is NOT signed out FINAL DIAGNOSIS Diagnosis 1. 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 2. 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 Microscopic Comment 1. Although grade of tumor is best assessed at resection, with these biopsies, both the in situ and invasive carcinoma are grade II. E cadherin and breast prognostic studies are pending and reported in an addendum. The case is reviewed with Dr. Lyndon Hill who concurs. Ashlee RUND DO Pathologist, Electronic Signature (Case signed 09/29/2014)  CLINICAL DATA: 54 year old female with palpable mass in the upper inner left breast discovered on self-examination. Also for annual bilateral mammograms.  EXAM: DIGITAL DIAGNOSTIC BILATERAL MAMMOGRAM WITH CAD  ULTRASOUND LEFT BREAST  COMPARISON: None  ACR Breast Density Category b: There are scattered areas of fibroglandular density.  FINDINGS: Spot compression views of the left breast and routine views of both breasts demonstrate a 2 x 2.5 cm irregular mass in the upper inner left breast.  Mildly prominent left axillary lymph nodes are noted.  No suspicious findings in the right breast identified.  Mammographic images were processed with CAD.  On physical exam, a firm fixed area of thickening is identified at the 11 o'clock position of the left breast 6 cm from the nipple.  Targeted ultrasound is performed, showing a 1.9 x 2.5 x 2.3 cm irregular hypoechoic mass at the 11 o'clock position of the left breast 6 cm from the nipple. An adjacent 1.1 x 0.5 x 0.4 cm irregular hypoechoic mass is noted, 4 mm anterior and lateral to the larger irregular mass. Both of these masses and compress an area measuring 3 cm.  Enlarged level 1 left axillary lymph nodes are identified.  IMPRESSION: 2.5 cm irregular mass in the upper inner left breast with adjacent 1.1 cm satellite mass and enlarged left axillary lymph nodes, highly suspicious for left breast malignancy and left  axillary lymph node metastases. Tissue sampling is recommended.  No mammographic evidence of right breast malignancy.  RECOMMENDATION: Ultrasound-guided biopsies of dominant upper inner left breast mass and an enlarged left axillary lymph node. These procedures will be performed today but dictated in a separate report.  I have discussed the findings and recommendations with the patient. Results were also provided in writing at the conclusion of the visit. If applicable, a reminder letter will be sent to the patient regarding the next appointment.  BI-RADS CATEGORY 5: Highly suggestive of malignancy.   Electronically Signed By: Ashlee Hill M.D. On: 09/26/2014 13:04Specimen Gross and Clinical Information.  The patient is a 54 year old female   Other Problems Anderson [place], [place]; 10/08/2014 7:46 AM) Anxiety Disorder Back Pain Depression Hemorrhoids High blood pressure Hypercholesterolemia  Past Surgical History Jeanann Lewandowsky, RMA; 10/08/2014 7:46 AM) Breast Biopsy Left. Oral Surgery Tonsillectomy  Diagnostic Studies History Anderson Malta [place], [place]; 10/08/2014 7:46 AM) Colonoscopy never Mammogram within last year Pap Smear >5 years ago  Social History Anderson Malta [place], RMA; 10/08/2014 7:46 AM) Alcohol use Moderate alcohol use. No caffeine use No drug use Tobacco use Never smoker.  Family History Anderson [place], [place]; 10/08/2014 7:46 AM) Anesthetic complications Father, Mother. Arthritis Father, Mother. Bleeding disorder Father. Cervical Cancer Family Members In General. Depression Family Members In General, Father, Mother. Diabetes Mellitus Family Members In General. Heart Disease Father. Heart disease in female family member before age 75 Hypertension Family Members In General, Father, Mother. Ischemic Bowel Disease Mother. Melanoma Father. Migraine Headache Mother. Prostate Cancer Father. Respiratory Condition Family Members In  General. Thyroid problems Father, Mother.  Pregnancy / Birth History Anderson Malta [place], [place]; 10/08/2014 7:46 AM) Age at menarche 60 years. Gravida 0 Para 0 Regular periods     Review of  Systems [employer] [place] RMA; 10/08/2014 7:46 AM) General Not Present- Appetite Loss, Chills, Fatigue, Fever, Night Sweats, Weight Gain and Weight Loss. Skin Not Present- Change in Wart/Mole, Dryness, Hives, Jaundice, New Lesions, Non-Healing Wounds, Rash and Ulcer. HEENT Present- Nose Bleed and Seasonal Allergies. Not Present- Earache, Hearing Loss, Hoarseness, Oral Ulcers, Ringing in the Ears, Sinus Pain, Sore Throat, Visual Disturbances, Wears glasses/contact lenses and Yellow Eyes. Respiratory Not Present- Bloody sputum, Chronic Cough, Difficulty Breathing, Snoring and Wheezing. Breast Present- Breast Mass and Breast Pain. Not Present- Nipple Discharge and Skin Changes. Cardiovascular Not Present- Chest Pain, Difficulty Breathing Lying Down, Leg Cramps, Palpitations, Rapid Heart Rate, Shortness of Breath and Swelling of Extremities. Gastrointestinal Present- Bloating and Constipation. Not Present- Abdominal Pain, Bloody Stool, Change in Bowel Habits, Chronic diarrhea, Difficulty Swallowing, Excessive gas, Gets full quickly at meals, Hemorrhoids, Indigestion, Nausea, Rectal Pain and Vomiting. Female Genitourinary Not Present- Frequency, Nocturia, Painful Urination, Pelvic Pain and Urgency. Musculoskeletal Not Present- Back Pain, Joint Pain, Joint Stiffness, Muscle Pain, Muscle Weakness and Swelling of Extremities. Neurological Not Present- Decreased Memory, Fainting, Headaches, Numbness, Seizures, Tingling, Tremor, Trouble walking and Weakness. Psychiatric Present- Anxiety, Change in Sleep Pattern, Depression and Fearful. Not Present- Bipolar and Frequent crying. Endocrine Present- Hot flashes. Not Present- Cold Intolerance, Excessive Hunger, Hair Changes, Heat Intolerance and New Diabetes. Hematology Not  Present- Easy Bruising, Excessive bleeding, Gland problems, HIV and Persistent Infections.   Physical Exam (Dhairya Corales A. Maridee Slape MD; 10/08/2014 2:35 PM)  General Mental Status-Alert. General Appearance-Consistent with stated age. Hydration-Well hydrated. Voice-Normal.  Head and Neck Head-normocephalic, atraumatic with no lesions or palpable masses. Trachea-midline. Thyroid Gland Characteristics - normal size and consistency.  Eye Eyeball - Bilateral-Extraocular movements intact. Sclera/Conjunctiva - Bilateral-No scleral icterus.  Chest and Lung Exam Chest and lung exam reveals -quiet, even and easy respiratory effort with no use of accessory muscles and on auscultation, normal breath sounds, no adventitious sounds and normal vocal resonance. Inspection Chest Wall - Normal. Back - normal.  Breast Breast - Left-Symmetric, Non Tender, No Biopsy scars, no Dimpling, No Inflammation, No Lumpectomy scars, No Mastectomy scars, No Peau d' Orange. Breast - Right-Symmetric, Non Tender, No Biopsy scars, no Dimpling, No Inflammation, No Lumpectomy scars, No Mastectomy scars, No Peau d' Orange. Breast Lump-No Palpable Breast Mass. Note: left breast mass upper outer quadreant 2 -3 cm mobile   Cardiovascular Cardiovascular examination reveals -normal heart sounds, regular rate and rhythm with no murmurs and normal pedal pulses bilaterally.  Abdomen Inspection Inspection of the abdomen reveals - No Hernias. Skin - Scar - no surgical scars. Palpation/Percussion Palpation and Percussion of the abdomen reveal - Soft, Non Tender, No Rebound tenderness, No Rigidity (guarding) and No hepatosplenomegaly. Auscultation Auscultation of the abdomen reveals - Bowel sounds normal.  Neurologic Neurologic evaluation reveals -alert and oriented x 3 with no impairment of recent or remote memory. Mental Status-Normal.  Musculoskeletal Normal Exam - Left-Upper Extremity  Strength Normal and Lower Extremity Strength Normal. Normal Exam - Right-Upper Extremity Strength Normal and Lower Extremity Strength Normal.  Lymphatic Head & Neck  General Head & Neck Lymphatics: Bilateral - Description - Normal. Axillary  General Axillary Region: Bilateral - Description - Normal. Tenderness - Non Tender. Note:1 - 2 cm mobile LN left AXILLLA no matted. Femoral & Inguinal  Generalized Femoral & Inguinal Lymphatics: Bilateral - Description - Normal. Tenderness - Non Tender.    Assessment & Plan (Bricia Taher A. Tyren Dugar MD; 10/08/2014 2:37 PM)  BREAST CANCER, LEFT (174.9  C50.912) Impression: sem with  oncology and radiation. Pt is a good chemotherapy candidate and may qualify for study to avoid ALND after chemtherapy. She will need a port and will get her set up for this. Risk of bleeding, infection, PTX, hemothorax, migration, fragmentation and replacement , organ injury discussed. She agrees to proceed.

## 2014-10-21 NOTE — Op Note (Signed)
Ashlee Hill 10-May-1961 [id number] 10/21/2014   Preoperative diagnosis: PAC needed  Postoperative diagnosis: Same  Procedure: Portacath Placement with U/S and fluoroscopy  Surgeon: Erroll Luna, MD, FACS  Anesthesia: General and 0.25 % local with epinephrine  Clinical History and Indications: The patient is getting ready to begin chemotherapy for her cancer. She  needs a Port-A-Cath for venous access.  Description of Procedure: I have seen the patient in the holding area and confirmed the plans for the procedure as noted above. I reviewed the risks and complications again and the patient has no further questions. She wishes to proceed.   The patient was then taken to the operating room. After satisfactory LMA  anesthesia had been obtained the upper chest and lower neck were prepped and draped as a sterile field. The timeout was done.  The right INTERNAL jugular  vein was entered  With U/S guidance and the guidewire threaded into the superior vena cava right atrial area under fluoroscopic guidance. An incision was then made on the anterior chest wall and a subcutaneous pocket fashioned for the port reservoir.  The port tubing was then brought through a subcutaneous tunnel from the port site to the guidewire site. The dilator and peel-away sheath were then advanced over the guidewire while monitoring this with fluoroscopy. The guidewire and dilator were removed and the tubing threaded to approximately 22 cm. The peel-away sheath was then removed. The catheter aspirated and flushed easily. Using fluoroscopy the tip was backed out into the superior vena cava right atrial junction area. It aspirated and flushed easily. The reservoir was attached and the locking mechanism engaged. That aspirated and flushed easily.  The reservoir was secured to the fascia with 1 sutures of 2-0 Prolene. A final check with fluoroscopy was done to make sure we had no kinks and good positioning of the tip of the  catheter. Everything appeared to be okay. The catheter was aspirated, flushed with dilute heparin and then concentrated aqueous heparin.  The incision was then closed with interrupted 3-0 Vicryl, and 4-0 Monocryl subcuticular with Dermabond on the skin.  There were no operative complications. Estimated blood loss was minimal. All counts were correct. The patient tolerated the procedure well.  Turner Daniels , MD, FACS 10/21/2014 3:39 PM

## 2014-10-21 NOTE — Interval H&P Note (Signed)
History and Physical Interval Note:  10/21/2014 2:17 PM  Ashlee Hill  has presented today for surgery, with the diagnosis of Poor Venous Access  The various methods of treatment have been discussed with the patient and family. After consideration of risks, benefits and other options for treatment, the patient has consented to  Procedure(s): INSERTION PORT-A-CATH (Right) as a surgical intervention .  The patient's history has been reviewed, patient examined, no change in status, stable for surgery.  I have reviewed the patient's chart and labs.  Questions were answered to the patient's satisfaction.     [name] A.

## 2014-10-22 ENCOUNTER — Encounter (HOSPITAL_BASED_OUTPATIENT_CLINIC_OR_DEPARTMENT_OTHER): Payer: Self-pay | Admitting: Surgery

## 2014-10-22 ENCOUNTER — Telehealth: Payer: Self-pay | Admitting: *Deleted

## 2014-10-22 NOTE — Telephone Encounter (Signed)
PT. WOULD LIKE SOMETHING ELSE CALLED TO HER PHARMACY FOR NAUSEA AND SLEEP. PT. IS FOR CHEMO ON 10/24/14. THIS NOTE ROUTED TO DR.FENG AND THU BRAY,RN.

## 2014-10-22 NOTE — Telephone Encounter (Signed)
I have spoken with pt and suggested her to increase ativan dose. Thanks.  Truitt Merle

## 2014-10-23 ENCOUNTER — Telehealth: Payer: Self-pay | Admitting: *Deleted

## 2014-10-23 ENCOUNTER — Other Ambulatory Visit: Payer: Self-pay | Admitting: *Deleted

## 2014-10-23 DIAGNOSIS — C50212 Malignant neoplasm of upper-inner quadrant of left female breast: Secondary | ICD-10-CM

## 2014-10-23 NOTE — Telephone Encounter (Signed)
Patient called and is requesting the onpro injection. Md notified along with desk RN

## 2014-10-23 NOTE — Telephone Encounter (Signed)
Patient called to say she is extremely anxious about her first chemo tomorrow. She said she has been vomiting all day and hasn't kept anything down, ever her Xanax, which she take for an anxiety disorder. Advised her to use Ativan sublingually and then to take a Zofran with a small sip of water.  Once she gets the vomiting under control, she should be able to resume her Xanax.  I talked with her in detail about what to expect in the infusion area tomorrow, what she will experience after the chemo, and how to proactively manage nausea and vomiting.  She said she went to chemo class, but she was so overwhelmed and anxious that she could not really take in the information given to her.  She has called her psychiatrist to ask for her help as well.  Reassure patient that a nurse will sit with her while she receives adriamycin and that she will feel no worse after leaving than when she came in.  Promised her that I will come visit her in the infusion room tomorrow.  She seemed reassured and agreed to try the Ativan and Zofran.

## 2014-10-24 ENCOUNTER — Encounter: Payer: Self-pay | Admitting: *Deleted

## 2014-10-24 ENCOUNTER — Ambulatory Visit (HOSPITAL_BASED_OUTPATIENT_CLINIC_OR_DEPARTMENT_OTHER): Payer: 59

## 2014-10-24 ENCOUNTER — Other Ambulatory Visit (HOSPITAL_BASED_OUTPATIENT_CLINIC_OR_DEPARTMENT_OTHER): Payer: 59

## 2014-10-24 ENCOUNTER — Telehealth: Payer: Self-pay | Admitting: Hematology

## 2014-10-24 DIAGNOSIS — C50212 Malignant neoplasm of upper-inner quadrant of left female breast: Secondary | ICD-10-CM | POA: Diagnosis not present

## 2014-10-24 DIAGNOSIS — Z5111 Encounter for antineoplastic chemotherapy: Secondary | ICD-10-CM

## 2014-10-24 LAB — COMPREHENSIVE METABOLIC PANEL (CC13)
ALT: 29 U/L (ref 0–55)
AST: 28 U/L (ref 5–34)
Albumin: 3.7 g/dL (ref 3.5–5.0)
Alkaline Phosphatase: 79 U/L (ref 40–150)
Anion Gap: 14 mEq/L — ABNORMAL HIGH (ref 3–11)
BUN: 10 mg/dL (ref 7.0–26.0)
CO2: 25 mEq/L (ref 22–29)
CREATININE: 1 mg/dL (ref 0.6–1.1)
Calcium: 9.3 mg/dL (ref 8.4–10.4)
Chloride: 99 mEq/L (ref 98–109)
EGFR: 67 mL/min/{1.73_m2} — ABNORMAL LOW (ref >90)
Glucose: 179 mg/dl — ABNORMAL HIGH (ref 70–140)
Potassium: 3.7 mEq/L (ref 3.5–5.1)
SODIUM: 138 meq/L (ref 136–145)
Total Bilirubin: 0.64 mg/dL (ref 0.20–1.20)
Total Protein: 7.1 g/dL (ref 6.4–8.3)

## 2014-10-24 LAB — CBC WITH DIFFERENTIAL/PLATELET
BASO%: 0.6 % (ref 0.0–2.0)
Basophils Absolute: 0.1 10*3/uL (ref 0.0–0.1)
EOS ABS: 0.1 10*3/uL (ref 0.0–0.5)
EOS%: 0.5 % (ref 0.0–7.0)
HCT: 42.9 % (ref 34.8–46.6)
HGB: 14.2 g/dL (ref 11.6–15.9)
LYMPH%: 12.2 % — ABNORMAL LOW (ref 14.0–49.7)
MCH: 33 pg (ref 25.1–34.0)
MCHC: 33.1 g/dL (ref 31.5–36.0)
MCV: 99.9 fL (ref 79.5–101.0)
MONO#: 0.8 10*3/uL (ref 0.1–0.9)
MONO%: 7.9 % (ref 0.0–14.0)
NEUT%: 78.8 % — ABNORMAL HIGH (ref 38.4–76.8)
NEUTROS ABS: 7.7 10*3/uL — AB (ref 1.5–6.5)
Platelets: 197 10*3/uL (ref 145–400)
RBC: 4.29 10*6/uL (ref 3.70–5.45)
RDW: 12.9 % (ref 11.2–14.5)
WBC: 9.8 10*3/uL (ref 3.9–10.3)
lymph#: 1.2 10*3/uL (ref 0.9–3.3)

## 2014-10-24 MED ORDER — PALONOSETRON HCL INJECTION 0.25 MG/5ML
INTRAVENOUS | Status: AC
  Filled 2014-10-24: qty 5

## 2014-10-24 MED ORDER — DOXORUBICIN HCL CHEMO IV INJECTION 2 MG/ML
60.0000 mg/m2 | Freq: Once | INTRAVENOUS | Status: AC
  Administered 2014-10-24: 134 mg via INTRAVENOUS
  Filled 2014-10-24: qty 67

## 2014-10-24 MED ORDER — SODIUM CHLORIDE 0.9 % IV SOLN
150.0000 mg | Freq: Once | INTRAVENOUS | Status: AC
  Administered 2014-10-24: 150 mg via INTRAVENOUS
  Filled 2014-10-24: qty 5

## 2014-10-24 MED ORDER — HEPARIN SOD (PORK) LOCK FLUSH 100 UNIT/ML IV SOLN
500.0000 [IU] | Freq: Once | INTRAVENOUS | Status: AC | PRN
  Administered 2014-10-24: 500 [IU]
  Filled 2014-10-24: qty 5

## 2014-10-24 MED ORDER — DEXAMETHASONE SODIUM PHOSPHATE 20 MG/5ML IJ SOLN
12.0000 mg | Freq: Once | INTRAMUSCULAR | Status: AC
  Administered 2014-10-24: 12 mg via INTRAVENOUS

## 2014-10-24 MED ORDER — SODIUM CHLORIDE 0.9 % IV SOLN
600.0000 mg/m2 | Freq: Once | INTRAVENOUS | Status: AC
  Administered 2014-10-24: 1340 mg via INTRAVENOUS
  Filled 2014-10-24: qty 67

## 2014-10-24 MED ORDER — DEXAMETHASONE SODIUM PHOSPHATE 20 MG/5ML IJ SOLN
INTRAMUSCULAR | Status: AC
  Filled 2014-10-24: qty 5

## 2014-10-24 MED ORDER — SODIUM CHLORIDE 0.9 % IV SOLN
Freq: Once | INTRAVENOUS | Status: AC
  Administered 2014-10-24: 13:00:00 via INTRAVENOUS

## 2014-10-24 MED ORDER — SODIUM CHLORIDE 0.9 % IJ SOLN
10.0000 mL | INTRAMUSCULAR | Status: DC | PRN
  Administered 2014-10-24: 10 mL
  Filled 2014-10-24: qty 10

## 2014-10-24 MED ORDER — PALONOSETRON HCL INJECTION 0.25 MG/5ML
0.2500 mg | Freq: Once | INTRAVENOUS | Status: AC
  Administered 2014-10-24: 0.25 mg via INTRAVENOUS

## 2014-10-24 NOTE — Progress Notes (Signed)
Spoke with patient today at her 1st chemo.  She is doing well.  She states she had an emotional day yesterday but is better today.  Informed her I would follow up with her next week and encouraged her to call with any needs or concerns.

## 2014-10-24 NOTE — Patient Instructions (Signed)
[facility] Discharge Instructions for Patients Receiving Chemotherapy  Today you received the following chemotherapy agents Adriamycin/Cytoxan.   To help prevent nausea and vomiting after your treatment, we encourage you to take your nausea medication as directed.    If you develop nausea and vomiting that is not controlled by your nausea medication, call the clinic.   BELOW ARE SYMPTOMS THAT SHOULD BE REPORTED IMMEDIATELY:  *FEVER GREATER THAN 100.5 F  *CHILLS WITH OR WITHOUT FEVER  NAUSEA AND VOMITING THAT IS NOT CONTROLLED WITH YOUR NAUSEA MEDICATION  *UNUSUAL SHORTNESS OF BREATH  *UNUSUAL BRUISING OR BLEEDING  TENDERNESS IN MOUTH AND THROAT WITH OR WITHOUT PRESENCE OF ULCERS  *URINARY PROBLEMS  *BOWEL PROBLEMS  UNUSUAL RASH Items with * indicate a potential emergency and should be followed up as soon as possible.  Feel free to call the clinic you have any questions or concerns. The clinic phone number is [phone].    

## 2014-10-24 NOTE — Telephone Encounter (Signed)
S/w pt confirming injection at 9:15 per 03/04 POF, there was no 9:00 spot available put pt in at 9:15.Ashlee Hill... KJ

## 2014-10-27 ENCOUNTER — Ambulatory Visit: Payer: 59

## 2014-10-27 ENCOUNTER — Ambulatory Visit (HOSPITAL_BASED_OUTPATIENT_CLINIC_OR_DEPARTMENT_OTHER): Payer: 59

## 2014-10-27 DIAGNOSIS — C50212 Malignant neoplasm of upper-inner quadrant of left female breast: Secondary | ICD-10-CM

## 2014-10-27 DIAGNOSIS — Z5189 Encounter for other specified aftercare: Secondary | ICD-10-CM

## 2014-10-27 MED ORDER — PEGFILGRASTIM INJECTION 6 MG/0.6ML ~~LOC~~
6.0000 mg | PREFILLED_SYRINGE | Freq: Once | SUBCUTANEOUS | Status: AC
  Administered 2014-10-27: 6 mg via SUBCUTANEOUS
  Filled 2014-10-27: qty 0.6

## 2014-10-27 NOTE — Patient Instructions (Signed)
Pegfilgrastim injection What is this medicine? PEGFILGRASTIM (peg fil GRA stim) is a long-acting granulocyte colony-stimulating factor that stimulates the growth of neutrophils, a type of white blood cell important in the body's fight against infection. It is used to reduce the incidence of fever and infection in patients with certain types of cancer who are receiving chemotherapy that affects the bone marrow. This medicine may be used for other purposes; ask your health care provider or pharmacist if you have questions. COMMON BRAND NAME(S): Neulasta What should I tell my health care provider before I take this medicine? They need to know if you have any of these conditions: -latex allergy -ongoing radiation therapy -sickle cell disease -skin reactions to acrylic adhesives (On-Body Injector only) -an unusual or allergic reaction to pegfilgrastim, filgrastim, other medicines, foods, dyes, or preservatives -pregnant or trying to get pregnant -breast-feeding How should I use this medicine? This medicine is for injection under the skin. If you get this medicine at home, you will be taught how to prepare and give the pre-filled syringe or how to use the On-body Injector. Refer to the patient Instructions for Use for detailed instructions. Use exactly as directed. Take your medicine at regular intervals. Do not take your medicine more often than directed. It is important that you put your used needles and syringes in a special sharps container. Do not put them in a trash can. If you do not have a sharps container, call your pharmacist or healthcare provider to get one. Talk to your pediatrician regarding the use of this medicine in children. Special care may be needed. Overdosage: If you think you have taken too much of this medicine contact a poison control center or emergency room at once. NOTE: This medicine is only for you. Do not share this medicine with others. What if I miss a dose? It is  important not to miss your dose. Call your doctor or health care professional if you miss your dose. If you miss a dose due to an On-body Injector failure or leakage, a new dose should be administered as soon as possible using a single prefilled syringe for manual use. What may interact with this medicine? Interactions have not been studied. Give your health care provider a list of all the medicines, herbs, non-prescription drugs, or dietary supplements you use. Also tell them if you smoke, drink alcohol, or use illegal drugs. Some items may interact with your medicine. This list may not describe all possible interactions. Give your health care provider a list of all the medicines, herbs, non-prescription drugs, or dietary supplements you use. Also tell them if you smoke, drink alcohol, or use illegal drugs. Some items may interact with your medicine. What should I watch for while using this medicine? You may need blood work done while you are taking this medicine. If you are going to need a MRI, CT scan, or other procedure, tell your doctor that you are using this medicine (On-Body Injector only). What side effects may I notice from receiving this medicine? Side effects that you should report to your doctor or health care professional as soon as possible: -allergic reactions like skin rash, itching or hives, swelling of the face, lips, or tongue -dizziness -fever -pain, redness, or irritation at site where injected -pinpoint red spots on the skin -shortness of breath or breathing problems -stomach or side pain, or pain at the shoulder -swelling -tiredness -trouble passing urine Side effects that usually do not require medical attention (report to your doctor   or health care professional if they continue or are bothersome): -bone pain -muscle pain This list may not describe all possible side effects. Call your doctor for medical advice about side effects. You may report side effects to FDA at  1-800-FDA-1088. Where should I keep my medicine? Keep out of the reach of children. Store pre-filled syringes in a refrigerator between 2 and 8 degrees C (36 and 46 degrees F). Do not freeze. Keep in carton to protect from light. Throw away this medicine if it is left out of the refrigerator for more than 48 hours. Throw away any unused medicine after the expiration date. NOTE: This sheet is a summary. It may not cover all possible information. If you have questions about this medicine, talk to your doctor, pharmacist, or health care provider.  2015, Elsevier/Gold Standard. (2013-11-07 16:14:05)  

## 2014-10-28 ENCOUNTER — Telehealth: Payer: Self-pay | Admitting: Hematology

## 2014-10-28 ENCOUNTER — Ambulatory Visit (HOSPITAL_BASED_OUTPATIENT_CLINIC_OR_DEPARTMENT_OTHER): Payer: 59 | Admitting: Hematology

## 2014-10-28 ENCOUNTER — Encounter: Payer: Self-pay | Admitting: Hematology

## 2014-10-28 VITALS — BP 153/101 | HR 104 | Temp 97.9°F | Resp 18 | Ht 65.0 in | Wt 232.7 lb

## 2014-10-28 DIAGNOSIS — C50212 Malignant neoplasm of upper-inner quadrant of left female breast: Secondary | ICD-10-CM

## 2014-10-28 DIAGNOSIS — F4322 Adjustment disorder with anxiety: Secondary | ICD-10-CM

## 2014-10-28 DIAGNOSIS — Z171 Estrogen receptor negative status [ER-]: Secondary | ICD-10-CM

## 2014-10-28 DIAGNOSIS — C773 Secondary and unspecified malignant neoplasm of axilla and upper limb lymph nodes: Secondary | ICD-10-CM

## 2014-10-28 NOTE — Telephone Encounter (Signed)
gv and printed appt sched and avs for pt for march adn April...sed added tx. °

## 2014-10-28 NOTE — Progress Notes (Signed)
1 week [facility]  Telephone:(336) [phone] Fax:([phone]) [place] Note   Patient Care Team: Seward Carol, MD as PCP - General (Internal Medicine) Erroll Luna, MD as Consulting Physician (General Surgery) Truitt Merle, MD as Consulting Physician (Hematology) Thea Silversmith, MD as Consulting Physician (Radiation Oncology) Rockwell Germany, RN as Registered Nurse Mauro Kaufmann, RN as Registered Nurse 10/28/2014  CHIEF COMPLAINTS/PURPOSE OF CONSULTATION:  Newly diagnosed breast cancer   Oncology History   Breast cancer of upper-inner quadrant of left female breast   Staging form: Breast, AJCC 7th Edition     Clinical stage from 10/08/2014: Stage IIB (T2, N1, M0) - Unsigned     Pathologic: Stage IIB (T2, N1, cM0) - Unsigned       Breast cancer of upper-inner quadrant of left female breast   09/26/2014 Breast US 2.5 cm irregular mass in the upper inner left breast with adjacent 1.1 cm satellite mass and enlarged left axillary lymph nodes, highly suspicious for left breast malignancy and left axillary lymph node metastases. Tissue sampling is recommended.    09/26/2014 Mammogram Spot compression views of the left breast and routine views of both breasts demonstrate a 2 x 2.5 cm irregular mass in the upper inner left breast.    09/29/2014 Initial Biopsy 1. Breast, left, needle core biopsy, mass, 11 o'clock - INVASIVE MAMMARY CARCINOMA, SEE COMMENT. - MAMMARY CARCINOMA IN SITU. 2. Lymph node, needle/core biopsy, left axillary - ONE LYMPH NODE, POSITIVE FOR METASTATIC MAMMARY CARCINOMA (1/1).   09/29/2014 Receptors her2 Estrogen Receptor: 0%, NEGATIVE Progesterone Receptor: 0%, NEGATIVE Proliferation Marker Ki67: 93%   10/01/2014 Initial Diagnosis Breast cancer of upper-inner quadrant of left female breast   10/17/2014 Imaging CT and bone scan negative for distant metastasis    10/24/2014 -  Adjuvant Chemotherapy ddACx4 then weekly Taxol X12     HISTORY OF PRESENTING  ILLNESS:  Ashlee Hill 54 y.o. female presents to our multidisciplinary breast clinic today to discuss the management of her newly diagnosed breast cancer  She noticed a left breast lump in August 2015, no tenderness, skin or nipple change. She otherwise felt well. She did not seek immediate medical attention due to lack of insurance. She finally got her insurance approved and saw her primary care physician recently. She was referred for mammogram which showed a 2.5 cm irregular mass in the upper inner left breast. She underwent left breast mass and axillary node biopsy on 09/29/2014 and both biopsy showed invasive ductal carcinoma, ER negative, PR negative, HER-2 negative.  She otherwise feels well, no pain or ther complains. She has good appetite and her weight is stable.  INTERIM HISTORY; Allissa returns for follow-up and toxicity check up after her first cycle of chemotherapy. She has been very nervous and anxious in the past few weeks and had a panic attack the day before chemotherapy. She called her psychiatrist and filled xanax and Phenergan suppository, and felt better afterwards. She still has intermittent anxiety, and mild nausea, no vomiting. Her appetite has not changed much since her chemotherapy, no fever or chills. No other new symptoms.  MEDICAL HISTORY:  Past Medical History  Diagnosis Date  . Breast cancer of upper-inner quadrant of left female breast 10/01/2014  . Hypertension   . Family history of breast cancer   . Wears glasses     driving    SURGICAL HISTORY: Past Surgical History  Procedure Laterality Date  . Tonsillectomy    . Wisdom tooth extraction    .  Portacath placement Right 10/21/2014    Procedure: INSERTION PORT-A-CATH;  Surgeon: Erroll Luna, MD;  Location: [facility];  Service: General;  Laterality: Right;     GYN HISTORY  Menarchal: 12 LMP: 09/27/2014 Contraceptive: no HRT:  G0P0    SOCIAL HISTORY: History   Social History   . Marital Status: Single    Spouse Name: N/A  . Number of Children: N/A  . Years of Education: N/A   Occupational History  . Not on file.   Social History Main Topics  . Smoking status: Never Smoker   . Smokeless tobacco: Not on file  . Alcohol Use: 0.6 oz/week    1 Glasses of wine per week     Comment: socail drinker  . Drug Use: No  . Sexual Activity: Not on file   Other Topics Concern  . Not on file   Social History Narrative    FAMILY HISTORY: Family History  Problem Relation Age of Onset  . Prostate cancer Father 45    currently 30  . Breast cancer Paternal Aunt 48    deceased 87  . Lung cancer Maternal Grandfather 20    smoker; deceased  . Thyroid cancer Cousin 44    pat first cousin; currently 59     Father had prostate cancer at age of 86 Paternal aunt had breast caner in her 81's Paternal cousin had thyroid cancer at age of before 27 Maternal grandfather had lung cancer    ALLERGIES:  has No Known Allergies.  MEDICATIONS:  Current Outpatient Prescriptions  Medication Sig Dispense Refill  . ALPRAZolam (XANAX) 1 MG tablet Take 1 mg by mouth as needed for anxiety.    Marland Kitchen aspirin 81 MG tablet Take 81 mg by mouth daily.    Marland Kitchen atorvastatin (LIPITOR) 10 MG tablet Take 10 mg by mouth daily.    . calcium carbonate (OS-CAL) 600 MG TABS tablet Take 600 mg by mouth daily with breakfast.    . gabapentin (NEURONTIN) 100 MG capsule Take 100 mg by mouth as needed.    . hydrochlorothiazide (HYDRODIURIL) 25 MG tablet TAKE 1 TABLET DAILY    . lidocaine-prilocaine (EMLA) cream Apply to affected area once 30 g 3  . LORazepam (ATIVAN) 1 MG tablet Take 1 tablet (1 mg total) by mouth at bedtime as needed for sleep. 30 tablet 0  . metoprolol (LOPRESSOR) 100 MG tablet Take 100 mg by mouth daily.    . ondansetron (ZOFRAN) 8 MG tablet Take 1 tablet (8 mg total) by mouth 2 (two) times daily as needed. Start on the third day after chemotherapy. 30 tablet 1  . oxyCODONE-acetaminophen  (ROXICET) 5-325 MG per tablet Take 1-2 tablets by mouth every 4 (four) hours as needed. 30 tablet 0  . promethazine (PHENERGAN) 25 MG suppository     . venlafaxine XR (EFFEXOR-XR) 150 MG 24 hr capsule Take 150 mg by mouth daily with breakfast.     No current facility-administered medications for this visit.    REVIEW OF SYSTEMS:   Constitutional: Denies fevers, chills or abnormal night sweats Eyes: Denies blurriness of vision, double vision or watery eyes Ears, nose, mouth, throat, and face: Denies mucositis or sore throat Respiratory: Denies cough, dyspnea or wheezes Cardiovascular: Denies palpitation, chest discomfort or lower extremity swelling Gastrointestinal:  Denies nausea, heartburn or change in bowel habits Skin: Denies abnormal skin rashes Lymphatics: Denies new lymphadenopathy or easy bruising Neurological:Denies numbness, tingling or new weaknesses Behavioral/Psych: Mood is stable, no new changes  All other systems were reviewed with the patient and are negative.  PHYSICAL EXAMINATION: ECOG PERFORMANCE STATUS: 0 - Asymptomatic  Filed Vitals:   10/28/14 0943  BP: 153/101  Pulse: 104  Temp: 97.9 F (36.6 C)  Resp: 18   Filed Weights   10/28/14 0943  Weight: 232 lb 11.2 oz (105.552 kg)    GENERAL:alert, no distress and comfortable SKIN: skin color, texture, turgor are normal, no rashes or significant lesions. Port site looks clean  EYES: normal, conjunctiva are pink and non-injected, sclera clear OROPHARYNX:no exudate, no erythema and lips, buccal mucosa, and tongue normal  NECK: supple, thyroid normal size, non-tender, without nodularity LYMPH:  no palpable lymphadenopathy in the cervical, axillary or inguinal LUNGS: clear to auscultation and percussion with normal breathing effort HEART: regular rate & rhythm and no murmurs and no lower extremity edema ABDOMEN:abdomen soft, non-tender and normal bowel sounds Musculoskeletal:no cyanosis of digits and no clubbing   PSYCH: alert & oriented x 3 with fluent speech NEURO: no focal motor/sensory deficits Breasts: Breast inspection showed them to be symmetrical with no nipple discharge. Palpation of the left breasts showed a 5X3.5cm mass in the upper outer quadrant, no palpable axillary lymph node. Exam of the right breast and axillary was normal.    LABORATORY DATA:  I have reviewed the data as listed Lab Results  Component Value Date   WBC 9.8 10/24/2014   HGB 14.2 10/24/2014   HCT 42.9 10/24/2014   MCV 99.9 10/24/2014   PLT 197 10/24/2014    Recent Labs  10/08/14 0833 10/24/14 1042  NA 136 138  K 4.4 3.7  CO2 21* 25  GLUCOSE 144* 179*  BUN 15.2 10.0  CREATININE 0.9 1.0  CALCIUM 9.2 9.3  PROT 7.3 7.1  ALBUMIN 3.8 3.7  AST 25 28  ALT 25 29  ALKPHOS 98 79  BILITOT 0.52 0.64    PATHOLOGY REPORT 09/26/2014 Diagnosis 1. Breast, left, needle core biopsy, mass, 11 o'clock - INVASIVE MAMMARY CARCINOMA, SEE COMMENT. - MAMMARY CARCINOMA IN SITU. 2. Lymph node, needle/core biopsy, left axillary - ONE LYMPH NODE, POSITIVE FOR METASTATIC MAMMARY CARCINOMA (1/1). Microscopic Comment 1. Although grade of tumor is best assessed at resection, with these biopsies, both the in situ and invasive carcinoma are grade II. The invasive carcinoma demonstrates strong diffuse E-cadherin expression; supporting a ductal phenotype. 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  Estrogen Receptor: 0%, NEGATIVE Progesterone Receptor: 0%, NEGATIVE Proliferation Marker Ki67: 93% 1. A sample was sent to [facility] for HER-2 testing by FISH. The results are as follows: Negative.  RADIOGRAPHIC STUDIES: I have personally reviewed the radiological images as listed and agreed with the findings in the report.  Mr Breast Bilateral W Wo Contrast 10/08/2014    IMPRESSION: 1. Biopsy  proven malignancy in the upper inner left breast with adjacent/contiguous areas of nodularity measures up to 3.5 cm, with multiple morphologically abnormal axillary lymph nodes compatible with known axillary metastases.  2. No MRI evidence of malignancy in the right breast.  RECOMMENDATION: Treatment plan for known left breast malignancy.  BI-RADS CATEGORY  6: Known biopsy-proven malignancy.   Electronically Signed   By: Everlean Alstrom M.D.   On: 10/08/2014 10:29   US Breast Ltd Uni Left Inc Axilla 09/26/2014    IMPRESSION: 2.5 cm irregular mass in the upper inner left breast with adjacent 1.1 cm satellite mass and enlarged left  axillary lymph nodes, highly suspicious for left breast malignancy and left axillary lymph node metastases. Tissue sampling is recommended.  No mammographic evidence of right breast malignancy.    CT chest, abdomen and pelvis with contrast 10/17/2014 IMPRESSION: 1. Left breast lesion is identified compatible with the clinical history of breast cancer. 2. Enlarged left axillary lymph nodes suspicious for metastatic adenopathy. 3. No specific features identified to suggest distant metastatic disease. 4. Hepatic steatosis.  Bone scan 10/17/2014 IMPRESSION: 1. The uptake pattern within the skeleton is not highly suspicious for malignancy. However, subtle increased uptake in the midshaft of the right humerus and the lower lumbar spine posteriorly merits further evaluation with plain films. 2. The uptake over the lower extremities is consistent with degenerative change.  Lumbar spine X-ray 10/20/14 IMPRESSION: No lytic or sclerotic osseous lesion.  Probable degenerative facet osteoarthritic change at L4-L5 and L5-S1 which may correspond to the area of abnormal uptake at recent bone scan.  RIGHT HUMERUS X-RAY 10/20/2014 IMPRESSION: No abnormality seen to correspond to abnormal uptake seen on bone scan   ASSESSMENT & PLAN:  54 year old pre-menopausal female  with past medical history of hypertension, presented with a palpable left breast mass.  1. Left breast invasive ductal carcinoma, cT2N1M0, stage IIB, triple negative  -I discussed her all imaging finding and biopsy results extensively with patient. Her ultrasound and MRI breast reviewed multiple left axillary lymph nodes, at least N1 disease, possible N2. I discussed with her and she has locally advanced disease, and triple-negative breast cancer 10 to be more aggressive with early metastasis and cancer recurrence after surgery. -a staging CT showed no evidence of distant metastasis, the subtle increased uptake in the right humerus and lower lumbar spine on the bone scan was further evaluated by x-ray today, which were negative. - I recommend neoadjuvant chemotherapy given his very high risk of cancer recurrence after surgery alone. -She has no history of cardiac disease, normal EF on echo,  I would recommend dose dense Adriamycin and Cytoxan followed by paclitaxel.  -Given her young age and triple negative disease, positive family history, she was referred to see genetic counselor for genetic testing, her genetic testing results are still pending. -If she has positive BRCA1/2 mutation, she will be a good candidate for the NSABP B-55 clinical trial. -she tolerated the first cycle well, I will schedule her next 3 cycles of AC.  2. Infertility -We discussed she may experience menopause with chemotherapy, the potentially not able to be pregnant afterwards. -She does not desire to have children in the future.  3.Anxiety and coping -she will take her xanax needed -I encouraged her to think positive -She has good support from her parents and friend.   Plan -schedule the next 3 cycles of dose dense AC every 2 weeks on Friday, with Neulasta support on Saturday. -I'll see her before each cycle of chemotherapy. -I given a prescription of wig today  All questions were answered. The patient knows to  call the clinic with any problems, questions or concerns. I spent 20 minutes counseling the patient face to face. The total time spent in the appointment was 25 minutes and more than 50% was on counseling.     Truitt Merle, MD 10/28/2014 10:44 AM

## 2014-10-28 NOTE — Telephone Encounter (Signed)
gv and printed appt sched and avs for pt for March and April...sed added tx. °

## 2014-10-29 ENCOUNTER — Encounter (HOSPITAL_COMMUNITY): Payer: 59

## 2014-10-29 ENCOUNTER — Ambulatory Visit (HOSPITAL_COMMUNITY): Payer: 59

## 2014-10-29 ENCOUNTER — Ambulatory Visit (HOSPITAL_COMMUNITY): Admission: RE | Admit: 2014-10-29 | Payer: 59 | Source: Ambulatory Visit

## 2014-11-01 ENCOUNTER — Emergency Department (HOSPITAL_COMMUNITY)
Admission: EM | Admit: 2014-11-01 | Discharge: 2014-11-01 | Disposition: A | Discharge status: Home / Self Care | Payer: 59 | Attending: Emergency Medicine | Admitting: Emergency Medicine

## 2014-11-01 ENCOUNTER — Encounter (HOSPITAL_COMMUNITY): Payer: Self-pay | Admitting: Emergency Medicine

## 2014-11-01 DIAGNOSIS — Z79899 Other long term (current) drug therapy: Secondary | ICD-10-CM | POA: Diagnosis not present

## 2014-11-01 DIAGNOSIS — Z9221 Personal history of antineoplastic chemotherapy: Secondary | ICD-10-CM | POA: Insufficient documentation

## 2014-11-01 DIAGNOSIS — K1230 Oral mucositis (ulcerative), unspecified: Secondary | ICD-10-CM | POA: Insufficient documentation

## 2014-11-01 DIAGNOSIS — Z853 Personal history of malignant neoplasm of breast: Secondary | ICD-10-CM | POA: Insufficient documentation

## 2014-11-01 DIAGNOSIS — I10 Essential (primary) hypertension: Secondary | ICD-10-CM | POA: Insufficient documentation

## 2014-11-01 DIAGNOSIS — Z7982 Long term (current) use of aspirin: Secondary | ICD-10-CM | POA: Diagnosis not present

## 2014-11-01 DIAGNOSIS — J029 Acute pharyngitis, unspecified: Secondary | ICD-10-CM | POA: Diagnosis present

## 2014-11-01 DIAGNOSIS — K1231 Oral mucositis (ulcerative) due to antineoplastic therapy: Secondary | ICD-10-CM

## 2014-11-01 LAB — CBC WITH DIFFERENTIAL/PLATELET
BASOS ABS: 0 10*3/uL (ref 0.0–0.1)
BASOS PCT: 2 % — AB (ref 0–1)
EOS ABS: 0.1 10*3/uL (ref 0.0–0.7)
Eosinophils Relative: 5 % (ref 0–5)
HEMATOCRIT: 38.9 % (ref 36.0–46.0)
Hemoglobin: 13.2 g/dL (ref 12.0–15.0)
Lymphocytes Relative: 63 % — ABNORMAL HIGH (ref 12–46)
Lymphs Abs: 0.8 10*3/uL (ref 0.7–4.0)
MCH: 33.2 pg (ref 26.0–34.0)
MCHC: 33.9 g/dL (ref 30.0–36.0)
MCV: 98 fL (ref 78.0–100.0)
MONO ABS: 0.1 10*3/uL (ref 0.1–1.0)
Monocytes Relative: 5 % (ref 3–12)
NEUTROS PCT: 25 % — AB (ref 43–77)
Neutro Abs: 0.3 10*3/uL — ABNORMAL LOW (ref 1.7–7.7)
Platelets: 119 10*3/uL — ABNORMAL LOW (ref 150–400)
RBC: 3.97 MIL/uL (ref 3.87–5.11)
RDW: 12.4 % (ref 11.5–15.5)
WBC: 1.3 10*3/uL — AB (ref 4.0–10.5)

## 2014-11-01 LAB — I-STAT CHEM 8, ED
BUN: 10 mg/dL (ref 6–23)
CALCIUM ION: 1.1 mmol/L — AB (ref 1.12–1.23)
Chloride: 97 mmol/L (ref 96–112)
Creatinine, Ser: 0.7 mg/dL (ref 0.50–1.10)
Glucose, Bld: 216 mg/dL — ABNORMAL HIGH (ref 70–99)
HEMATOCRIT: 40 % (ref 36.0–46.0)
Hemoglobin: 13.6 g/dL (ref 12.0–15.0)
Potassium: 3.9 mmol/L (ref 3.5–5.1)
SODIUM: 134 mmol/L — AB (ref 135–145)
TCO2: 23 mmol/L (ref 0–100)

## 2014-11-01 LAB — RAPID STREP SCREEN (MED CTR MEBANE ONLY): Streptococcus, Group A Screen (Direct): NEGATIVE

## 2014-11-01 MED ORDER — MAGIC MOUTHWASH W/LIDOCAINE: 5.0000 mL | Freq: Four times a day (QID) | ORAL | Status: DC | PRN

## 2014-11-01 NOTE — ED Provider Notes (Signed)
CSN: [id number]     Arrival date & time 11/01/14  1526 History   First MD Initiated Contact with Patient 11/01/14 1546     Chief Complaint  Patient presents with  . Sore Throat  . CA PATIENT      (Consider location/radiation/quality/duration/timing/severity/associated sxs/prior Treatment) Patient is a 54 y.o. female presenting with pharyngitis. The history is provided by the patient.  Sore Throat This is a new problem. Episode onset: 4 days ago. The problem occurs constantly. The problem has been gradually worsening. Pertinent negatives include no chest pain, no abdominal pain, no headaches and no shortness of breath. Associated symptoms comments: No cough, fever or n/v.  Pt started chemo last Friday for breast CA and due for next treatment next Friday.  . The symptoms are aggravated by swallowing. Nothing relieves the symptoms. She has tried nothing for the symptoms. The treatment provided no relief.    Past Medical History  Diagnosis Date  . Breast cancer of upper-inner quadrant of left female breast 10/01/2014  . Hypertension   . Family history of breast cancer   . Wears glasses     driving   Past Surgical History  Procedure Laterality Date  . Tonsillectomy    . Wisdom tooth extraction    . Portacath placement Right 10/21/2014    Procedure: INSERTION PORT-A-CATH;  Surgeon: Erroll Luna, MD;  Location: [facility];  Service: General;  Laterality: Right;   Family History  Problem Relation Age of Onset  . Prostate cancer Father 65    currently 70  . Breast cancer Paternal Aunt 70    deceased 79  . Lung cancer Maternal Grandfather 39    smoker; deceased  . Thyroid cancer Cousin 25    pat first cousin; currently 42   History  Substance Use Topics  . Smoking status: Never Smoker   . Smokeless tobacco: Not on file  . Alcohol Use: 0.6 oz/week    1 Glasses of wine per week     Comment: socail drinker   OB History    No data available     Review of Systems   Respiratory: Negative for shortness of breath.   Cardiovascular: Negative for chest pain.  Gastrointestinal: Negative for abdominal pain.  Neurological: Negative for headaches.  All other systems reviewed and are negative.     Allergies  Review of patient's allergies indicates no known allergies.  Home Medications   Prior to Admission medications   Medication Sig Start Date End Date Taking? Authorizing Provider  ALPRAZolam Duanne Moron) 1 MG tablet Take 1-2 mg by mouth 2 (two) times daily. Take 1 tablet (1 mg) in the morning & Take 2 tablets (2 mg) in the evening.   Yes Historical Provider, MD  aspirin 81 MG tablet Take 81 mg by mouth daily.   Yes Historical Provider, MD  atorvastatin (LIPITOR) 10 MG tablet Take 10 mg by mouth daily.   Yes Historical Provider, MD  calcium carbonate (OS-CAL) 600 MG TABS tablet Take 600 mg by mouth daily with breakfast.   Yes Historical Provider, MD  gabapentin (NEURONTIN) 100 MG capsule Take 100 mg by mouth at bedtime.    Yes Historical Provider, MD  hydrochlorothiazide (HYDRODIURIL) 25 MG tablet Take 25 mg by mouth daily.    Yes Historical Provider, MD  LORazepam (ATIVAN) 1 MG tablet Take 1 tablet (1 mg total) by mouth at bedtime as needed for sleep. 10/08/14  Yes Truitt Merle, MD  menthol-cetylpyridinium (CEPACOL) 3 MG lozenge Take  1 lozenge by mouth every 2 (two) hours as needed for sore throat (sore throat).   Yes Historical Provider, MD  metoprolol (LOPRESSOR) 100 MG tablet Take 100 mg by mouth daily.   Yes Historical Provider, MD  ondansetron (ZOFRAN) 8 MG tablet Take 1 tablet (8 mg total) by mouth 2 (two) times daily as needed. Start on the third day after chemotherapy. 10/20/14  Yes Truitt Merle, MD  promethazine (PHENERGAN) 25 MG suppository Place 25 mg rectally every 8 (eight) hours as needed for nausea (nausea).  10/23/14  Yes Historical Provider, MD  venlafaxine XR (EFFEXOR-XR) 150 MG 24 hr capsule Take 150 mg by mouth daily with breakfast.   Yes Historical  Provider, MD  lidocaine-prilocaine (EMLA) cream Apply to affected area once 10/20/14   Truitt Merle, MD  oxyCODONE-acetaminophen (ROXICET) 5-325 MG per tablet Take 1-2 tablets by mouth every 4 (four) hours as needed. 10/21/14   Erroll Luna, MD   BP 157/100 mmHg  Pulse 100  Temp(Src) 99.1 F (37.3 C) (Oral)  Resp 17  SpO2 100%  LMP 10/23/2014 Physical Exam  Constitutional: She is oriented to person, place, and time. She appears well-developed and well-nourished. No distress.  HENT:  Head: Normocephalic and atraumatic.  Mouth/Throat: Posterior oropharyngeal erythema present. No oropharyngeal exudate or posterior oropharyngeal edema.  Mild white film on the tongue but no plaques or lesions of the palate or gums  Eyes: Conjunctivae and EOM are normal. Pupils are equal, round, and reactive to light.  Neck: Normal range of motion. Neck supple.  Cardiovascular: Normal rate, regular rhythm and intact distal pulses.   No murmur heard. Pulmonary/Chest: Effort normal and breath sounds normal. No respiratory distress. She has no wheezes. She has no rales.  Abdominal: Soft. She exhibits no distension. There is no tenderness. There is no rebound and no guarding.  Musculoskeletal: Normal range of motion. She exhibits no edema or tenderness.  Lymphadenopathy:    She has no cervical adenopathy.  Neurological: She is alert and oriented to person, place, and time.  Skin: Skin is warm and dry. No rash noted. No erythema.  Psychiatric: She has a normal mood and affect. Her behavior is normal.  Nursing note and vitals reviewed.   ED Course  Procedures (including critical care time) Labs Review Labs Reviewed  CBC WITH DIFFERENTIAL/PLATELET - Abnormal; Notable for the following:    WBC 1.3 (*)    Platelets 119 (*)    Neutrophils Relative % 25 (*)    Lymphocytes Relative 63 (*)    Basophils Relative 2 (*)    Neutro Abs 0.3 (*)    All other components within normal limits  I-STAT CHEM 8, ED - Abnormal;  Notable for the following:    Sodium 134 (*)    Glucose, Bld 216 (*)    Calcium, Ion 1.10 (*)    All other components within normal limits  RAPID STREP SCREEN  CULTURE, GROUP A STREP    Imaging Review No results found.   EKG Interpretation None      MDM   Final diagnoses:  Mucositis due to chemotherapy    Patient recently started chemotherapy last week presents today with 4 days of worsening sore throat. She denies fever, nausea, vomiting, cough, shortness of breath. Pain with swallowing but she is able to tolerate her secretions. Patient has no stridor on exam. Her throat is erythematous but no exudate present. Patient states her throat feels like when she's had strep throat in the past. A possibility.  The patient has strep throat versus a candidal esophagitis vs mucositis due to receiving chemotherapy initiation.  CBC, rapid strep, Chem-8 pending  5:06 PM The patient's labs are consistent with a leukopenia and an absolute neutrophil count of 300. Patient's repeat temperature here is 99.8. A rapid strep is negative. We'll discuss patient with oncology  5:17 PM Spoke with Dr. Alen Blew who agrees with plan of home with magic mouthwash and feels most likely mucositis.  Pt given strict return precautions.  Blanchie Dessert, MD 11/01/14 [phone]

## 2014-11-01 NOTE — ED Notes (Signed)
Pt received first chemotherapy treatment last Friday. Starting Wednesday having sore throat without any other associated symptoms (cough, fever, chills, N, V, D). Has tried throat lozenges at home without alleviation. Has has strep throat before and says this feels similar. Next chemo treatment is this coming Friday. No other c/c.

## 2014-11-01 NOTE — Discharge Instructions (Signed)
Neutropenia Neutropenia is a condition that occurs when the level of a certain type of white blood cell (neutrophil) in your body becomes lower than normal. Neutrophils are made in the bone marrow and fight infections. These cells protect against bacteria and viruses. The fewer neutrophils you have, and the longer your body remains without them, the greater your risk of getting a severe infection becomes. CAUSES  The cause of neutropenia may be hard to determine. However, it is usually due to 3 main problems:   Decreased production of neutrophils. This may be due to:  Certain medicines such as chemotherapy.  Genetic problems.  Cancer.  Radiation treatments.  Vitamin deficiency.  Some pesticides.  Increased destruction of neutrophils. This may be due to:  Overwhelming infections.  Hemolytic anemia. This is when the body destroys its own blood cells.  Chemotherapy.  Neutrophils moving to areas of the body where they cannot fight infections. This may be due to:  Dialysis procedures.  Conditions where the spleen becomes enlarged. Neutrophils are held in the spleen and are not available to the rest of the body.  Overwhelming infections. The neutrophils are held in the area of the infection and are not available to the rest of the body. SYMPTOMS  There are no specific symptoms of neutropenia. The lack of neutrophils can result in an infection, and an infection can cause various problems. DIAGNOSIS  Diagnosis is made by a blood test. A complete blood count is performed. The normal level of neutrophils in human blood differs with age and race. Infants have lower counts than older children and adults. African Americans have lower counts than Caucasians or Asians. The average adult level is 1500 cells/mm3 of blood. Neutrophil counts are interpreted as follows:  Greater than 1000 cells/mm3 gives normal protection against infection.  500 to 1000 cells/mm3 gives an increased risk for  infection.  200 to 500 cells/mm3 is a greater risk for severe infection.  Lower than 200 cells/mm3 is a marked risk of infection. This may require hospitalization and treatment with antibiotic medicines. TREATMENT  Treatment depends on the underlying cause, severity, and presence of infections or symptoms. It also depends on your health. Your caregiver will discuss the treatment plan with you. Mild cases are often easily treated and have a good outcome. Preventative measures may also be started to limit your risk of infections. Treatment can include:  Taking antibiotics.  Stopping medicines that are known to cause neutropenia.  Correcting nutritional deficiencies by eating green vegetables to supply folic acid and taking vitamin B supplements.  Stopping exposure to pesticides if your neutropenia is related to pesticide exposure.  Taking a blood growth factor called sargramostim, pegfilgrastim, or filgrastim if you are undergoing chemotherapy for cancer. This stimulates white blood cell production.  Removal of the spleen if you have Felty's syndrome and have repeated infections. HOME CARE INSTRUCTIONS   Follow your caregiver's instructions about when you need to have blood work done.  Wash your hands often. Make sure others who come in contact with you also wash their hands.  Wash raw fruits and vegetables before eating them. They can carry bacteria and fungi.  Avoid people with colds or spreadable (contagious) diseases (chickenpox, herpes zoster, influenza).  Avoid large crowds.  Avoid construction areas. The dust can release fungus into the air.  Be cautious around children in daycare or school environments.  Take care of your respiratory system by coughing and deep breathing.  Bathe daily.  Protect your skin from cuts and  burns.  Do not work in the garden or with flowers and plants.  Care for the mouth before and after meals by brushing with a soft toothbrush. If you have  mucositis, do not use mouthwash. Mouthwash contains alcohol and can dry out the mouth even more.  Clean the area between the genitals and the anus (perineal area) after urination and bowel movements. Women need to wipe from front to back.  Use a water soluble lubricant during sexual intercourse and practice good hygiene after. Do not have intercourse if you are severely neutropenic. Check with your caregiver for guidelines.  Exercise daily as tolerated.  Avoid people who were vaccinated with a live vaccine in the past 30 days. You should not receive live vaccines (polio, typhoid).  Do not provide direct care for pets. Avoid animal droppings. Do not clean litter boxes and bird cages.  Do not share food utensils.  Do not use tampons, enemas, or rectal suppositories unless directed by your caregiver.  Use an electric razor to remove hair.  Wash your hands after handling magazines, letters, and newspapers. SEEK IMMEDIATE MEDICAL CARE IF:   You have a fever.  You have chills or start to shake.  You feel nauseous or vomit.  You develop mouth sores.  You develop aches and pains.  You have redness and swelling around open wounds.  Your skin is warm to the touch.  You have pus coming from your wounds.  You develop swollen lymph nodes.  You feel weak or fatigued.  You develop red streaks on the skin. MAKE SURE YOU:  Understand these instructions.  Will watch your condition.  Will get help right away if you are not doing well or get worse. Document Released: 01/28/2002 Document Revised: 10/31/2011 Document Reviewed: 02/25/2011 [facility] Patient Information 2015 [place], [place]. This information is not intended to replace advice given to you by your health care provider. Make sure you discuss any questions you have with your health care provider.  Oral Mucositis Oral mucositis is a mouth condition that may develop from treatment used to cure cancer. With this condition, sores  may appear on your lips, gums, tongue,and the roof or floor of your mouth. CAUSES  Oral mucositis can happen to anyone who is being treated with cancer therapies, including:  Cancer drugs (chemotherapy).  Radiation (X-ray or other high-energy rays) for head or neck cancer.  Bone marrow transplants and stem cell transplants. Oralmucositis is not caused by infection. However, the sores can become infected after they form. Infection can make oral mucositis worse. The following factors increase your risk of oral mucositis:  Poor oral hygiene.  Dental problems or oral diseases.  Smoking.  Chewing tobacco.  Drinking alcohol.  Having other diseases such as diabetes, human immunodeficiency virus (HIV), acquired immunodeficiency syndrome (AIDS), or kidney disease.  Not drinking enough water.  Having dentures that do not fit right.  Being a child. Children are more likely than adults to develop oral mucositis, but children usually heal more quickly.  Being elderly. Elderly adults are more likely to develop oral mucositis. SYMPTOMS  Symptoms vary. They may be mild or severe. Symptoms usually show up 7 to 10 days after starting treatment. Symptoms may include:   Sores in the mouth that bleed.  Color changes inside the mouth. Red, shiny areas appear.  White patches or pus in the mouth.  Pain in the mouth and throat.  Pain when talking.  Dryness and a burning feeling in the mouth.  Saliva that  is dry and thick.  Trouble eating, drinking, and swallowing.  Weight loss and malnutrition. This happens because eating is a problem. DIAGNOSIS  A caregiver will check your mouth. Then, the condition is given a grade. This grading system will help your caregiver treat your condition:  Grade 1: The inside of the mouth is sore and red.  Grade 2: There is redness in the mouth. Open sores are present. Swallowing food might be uncomfortable.  Grade 3: There are open sores. The mouth  is very red. It is very hard to swallow food.  Grade 4: No food or drink can be swallowed. TREATMENT  Oral mucositis usually heals on its own. Sometimes, changes in the cancer treatment can help. Keeping the mouth as clean and germ free as possible is very important.  Medicine may ease the condition. Different types of medicine may be needed, such as:  An antibiotic to fight infection, if present.  Medicine to help mucosal cells heal more quickly.  A water-based moisturizer for your lips, if they are affected.  Methods to control pain may include:  Keeping your mouth moist. You may suck on ice chips or sugar-free frozen ice pops.  Pain relievers that are swished around in the mouth. They will make the mouth numb to ease the pain (topical anesthetics).  Specific mouth rinses.  Prescribed, medicated gels. The gel coats the mouth. This protects nerve endings and lowers pain.  Narcotic pain medicines. These are strong drugs. They may be used if pain is very bad.  Mouth care can keep the mouth as healthy as possible and help to prevent infection. Mouth care includes:  A dental checkup. Your dental caregiver will make sure you have no teeth problems that could cause infection. Try to have the dental checkup before you begin your treatment for cancer.  Brushing your teeth several times a day. Use a soft toothbrush. Change to a new brush often. Use only gentle toothpastes. Ask your caregiver what product would be best for you. Make sure that you also floss your teeth.  Rinsing your mouth after every meal. Rinse again at bedtime. Do not use mouthwash that contains alcohol. Ask your caregiver what would be best for you. HOME CARE INSTRUCTIONS  Only take over-the-counter or prescription medicines for pain, discomfort, or fever as directed by your caregiver. Follow the directions carefully.  Do not smoke.  Do not drink alcohol.  Eat only bland, soft foods until your mouth sores heal.  Avoid sugary and acidic foods and drinks.  Ask your caregiver if you should add high-protein shakes to your diet to avoid malnutrition and weight loss.  Drink enough fluids to keep your urine clear or pale yellow.  If you have dentures, take them out often.  Continue to check your mouth every day for any signs of oral mucositis.  Keep all follow-up appointments. SEEK MEDICAL CARE IF:  You notice redness, soreness, or dryness in your mouth.  You have mouth or throat pain that makes it hard to swallow or speak. SEEK IMMEDIATE MEDICAL CARE IF:  Your pain in your mouth or throat gets worse and does not improve with pain medicine.  You have a lot of bleeding in your mouth.  You develop new, open sores in your mouth.  You notice patches of pus forming in your mouth.  You cannot swallow solid food or liquids.  You have a fever. Document Released: 03/25/2011 Document Revised: 10/31/2011 Document Reviewed: 03/25/2011 [facility] Patient Information 2015 [place], [place]. This  information is not intended to replace advice given to you by your health care provider. Make sure you discuss any questions you have with your health care provider. ° °

## 2014-11-03 ENCOUNTER — Telehealth: Payer: Self-pay | Admitting: *Deleted

## 2014-11-03 DIAGNOSIS — K1230 Oral mucositis (ulcerative), unspecified: Secondary | ICD-10-CM

## 2014-11-03 NOTE — Telephone Encounter (Signed)
Patient was seen in the ED over the weekend due to severe sore throat pain.  She was diagnosed with mucositis and prescribed Magic Mouthwash.  She said the pain is not better and says that she is concerned because she doesn't want to miss her chemotherapy treatment scheduled for Friday, 11/07/14.

## 2014-11-03 NOTE — Telephone Encounter (Signed)
Called pt. & informed to cont. Magic Mouthwash qid & can try baking soda/salt water sol in between.  She still has some oxycodone & informed that she can try one to see if that helps at all.  She is concerned about constipation & suggested that she take a stool softener when she takes the oxycodone. Informed that if she is worse tomorrow that we could get her in to see Selena Lesser NP.  Requested that she call back tomorrow.

## 2014-11-04 LAB — CULTURE, GROUP A STREP: Strep A Culture: NEGATIVE

## 2014-11-04 MED ORDER — MAGIC MOUTHWASH W/LIDOCAINE: 5.0000 mL | Freq: Four times a day (QID) | ORAL | Status: DC | PRN

## 2014-11-04 NOTE — Addendum Note (Signed)
Addended by: Jesse Fall on: 11/04/2014 11:17 AM   Modules accepted: Orders

## 2014-11-04 NOTE — Telephone Encounter (Signed)
Called pt today & she states she is doing a little better.  She reports eating & drinking OK & doesn't feel that she needs to come in.  She states she is out of her Magic Mouthwash.  Verified with Dr Burr Medico & refill will be e-scribed.

## 2014-11-07 ENCOUNTER — Ambulatory Visit (HOSPITAL_BASED_OUTPATIENT_CLINIC_OR_DEPARTMENT_OTHER): Payer: 59

## 2014-11-07 ENCOUNTER — Ambulatory Visit: Payer: 59

## 2014-11-07 ENCOUNTER — Ambulatory Visit (HOSPITAL_BASED_OUTPATIENT_CLINIC_OR_DEPARTMENT_OTHER): Payer: 59 | Admitting: Hematology

## 2014-11-07 ENCOUNTER — Encounter: Payer: Self-pay | Admitting: Hematology

## 2014-11-07 ENCOUNTER — Other Ambulatory Visit (HOSPITAL_BASED_OUTPATIENT_CLINIC_OR_DEPARTMENT_OTHER): Payer: 59

## 2014-11-07 VITALS — BP 162/71 | HR 68 | Temp 98.2°F | Resp 18 | Ht 65.0 in | Wt 233.8 lb

## 2014-11-07 DIAGNOSIS — C50212 Malignant neoplasm of upper-inner quadrant of left female breast: Secondary | ICD-10-CM | POA: Diagnosis not present

## 2014-11-07 DIAGNOSIS — Z171 Estrogen receptor negative status [ER-]: Secondary | ICD-10-CM | POA: Diagnosis not present

## 2014-11-07 DIAGNOSIS — C773 Secondary and unspecified malignant neoplasm of axilla and upper limb lymph nodes: Secondary | ICD-10-CM

## 2014-11-07 DIAGNOSIS — Z5111 Encounter for antineoplastic chemotherapy: Secondary | ICD-10-CM

## 2014-11-07 DIAGNOSIS — R739 Hyperglycemia, unspecified: Secondary | ICD-10-CM

## 2014-11-07 DIAGNOSIS — K1231 Oral mucositis (ulcerative) due to antineoplastic therapy: Secondary | ICD-10-CM | POA: Diagnosis not present

## 2014-11-07 DIAGNOSIS — Z5189 Encounter for other specified aftercare: Secondary | ICD-10-CM

## 2014-11-07 LAB — COMPREHENSIVE METABOLIC PANEL (CC13)
ALBUMIN: 3.7 g/dL (ref 3.5–5.0)
ALK PHOS: 117 U/L (ref 40–150)
ALT: 32 U/L (ref 0–55)
AST: 21 U/L (ref 5–34)
Anion Gap: 11 mEq/L (ref 3–11)
BUN: 11.9 mg/dL (ref 7.0–26.0)
CHLORIDE: 102 meq/L (ref 98–109)
CO2: 24 mEq/L (ref 22–29)
CREATININE: 0.9 mg/dL (ref 0.6–1.1)
Calcium: 9.1 mg/dL (ref 8.4–10.4)
EGFR: 79 mL/min/{1.73_m2} — ABNORMAL LOW (ref >90)
GLUCOSE: 218 mg/dL — AB (ref 70–140)
Potassium: 4.4 mEq/L (ref 3.5–5.1)
Sodium: 137 mEq/L (ref 136–145)
TOTAL PROTEIN: 7.1 g/dL (ref 6.4–8.3)
Total Bilirubin: 0.45 mg/dL (ref 0.20–1.20)

## 2014-11-07 LAB — CBC WITH DIFFERENTIAL/PLATELET
BASO%: 0.5 % (ref 0.0–2.0)
BASOS ABS: 0 10*3/uL (ref 0.0–0.1)
EOS ABS: 0 10*3/uL (ref 0.0–0.5)
EOS%: 0.2 % (ref 0.0–7.0)
HCT: 37.8 % (ref 34.8–46.6)
HGB: 13.4 g/dL (ref 11.6–15.9)
LYMPH#: 1.4 10*3/uL (ref 0.9–3.3)
LYMPH%: 24.6 % (ref 14.0–49.7)
MCH: 34.4 pg — ABNORMAL HIGH (ref 25.1–34.0)
MCHC: 35.4 g/dL (ref 31.5–36.0)
MCV: 96.9 fL (ref 79.5–101.0)
MONO#: 0.7 10*3/uL (ref 0.1–0.9)
MONO%: 11.3 % (ref 0.0–14.0)
NEUT#: 3.7 10*3/uL (ref 1.5–6.5)
NEUT%: 63.4 % (ref 38.4–76.8)
Platelets: 214 10*3/uL (ref 145–400)
RBC: 3.9 10*6/uL (ref 3.70–5.45)
RDW: 13.6 % (ref 11.2–14.5)
WBC: 5.9 10*3/uL (ref 3.9–10.3)

## 2014-11-07 MED ORDER — DOXORUBICIN HCL CHEMO IV INJECTION 2 MG/ML
60.0000 mg/m2 | Freq: Once | INTRAVENOUS | Status: AC
  Administered 2014-11-07: 134 mg via INTRAVENOUS
  Filled 2014-11-07: qty 67

## 2014-11-07 MED ORDER — SODIUM CHLORIDE 0.9 % IV SOLN
Freq: Once | INTRAVENOUS | Status: AC
  Administered 2014-11-07: 12:00:00 via INTRAVENOUS

## 2014-11-07 MED ORDER — SODIUM CHLORIDE 0.9 % IV SOLN
600.0000 mg/m2 | Freq: Once | INTRAVENOUS | Status: AC
  Administered 2014-11-07: 1340 mg via INTRAVENOUS
  Filled 2014-11-07: qty 67

## 2014-11-07 MED ORDER — PALONOSETRON HCL INJECTION 0.25 MG/5ML
INTRAVENOUS | Status: AC
  Filled 2014-11-07: qty 5

## 2014-11-07 MED ORDER — PALONOSETRON HCL INJECTION 0.25 MG/5ML
0.2500 mg | Freq: Once | INTRAVENOUS | Status: AC
  Administered 2014-11-07: 0.25 mg via INTRAVENOUS

## 2014-11-07 MED ORDER — HEPARIN SOD (PORK) LOCK FLUSH 100 UNIT/ML IV SOLN
500.0000 [IU] | Freq: Once | INTRAVENOUS | Status: AC | PRN
  Administered 2014-11-07: 500 [IU]
  Filled 2014-11-07: qty 5

## 2014-11-07 MED ORDER — SODIUM CHLORIDE 0.9 % IJ SOLN
10.0000 mL | INTRAMUSCULAR | Status: DC | PRN
  Administered 2014-11-07: 10 mL
  Filled 2014-11-07: qty 10

## 2014-11-07 MED ORDER — FOSAPREPITANT DIMEGLUMINE INJECTION 150 MG
Freq: Once | INTRAVENOUS | Status: AC
  Administered 2014-11-07: 13:00:00 via INTRAVENOUS
  Filled 2014-11-07: qty 5

## 2014-11-07 MED ORDER — PEGFILGRASTIM 6 MG/0.6ML ~~LOC~~ PSKT
6.0000 mg | PREFILLED_SYRINGE | Freq: Once | SUBCUTANEOUS | Status: AC
  Administered 2014-11-07: 6 mg via SUBCUTANEOUS
  Filled 2014-11-07: qty 0.6

## 2014-11-07 NOTE — Progress Notes (Signed)
[facility]  Telephone:([phone]) [phone] Fax:([phone]) [place] Note   Patient Care Team: Seward Carol, MD as PCP - General (Internal Medicine) Erroll Luna, MD as Consulting Physician (General Surgery) Truitt Merle, MD as Consulting Physician (Hematology) Thea Silversmith, MD as Consulting Physician (Radiation Oncology) Rockwell Germany, RN as Registered Nurse Mauro Kaufmann, RN as Registered Nurse 11/07/2014  CHIEF COMPLAINTS  Follow up breast cancer   Oncology History   Breast cancer of upper-inner quadrant of left female breast   Staging form: Breast, AJCC 7th Edition     Clinical stage from 10/08/2014: Stage IIB (T2, N1, M0) - Unsigned     Pathologic: Stage IIB (T2, N1, cM0) - Unsigned       Breast cancer of upper-inner quadrant of left female breast   09/26/2014 Breast US 2.5 cm irregular mass in the upper inner left breast with adjacent 1.1 cm satellite mass and enlarged left axillary lymph nodes, highly suspicious for left breast malignancy and left axillary lymph node metastases. Tissue sampling is recommended.    09/26/2014 Mammogram Spot compression views of the left breast and routine views of both breasts demonstrate a 2 x 2.5 cm irregular mass in the upper inner left breast.    09/29/2014 Initial Biopsy 1. Breast, left, needle core biopsy, mass, 11 o'clock - INVASIVE MAMMARY CARCINOMA, SEE COMMENT. - MAMMARY CARCINOMA IN SITU. 2. Lymph node, needle/core biopsy, left axillary - ONE LYMPH NODE, POSITIVE FOR METASTATIC MAMMARY CARCINOMA (1/1).   09/29/2014 Receptors her2 Estrogen Receptor: 0%, NEGATIVE Progesterone Receptor: 0%, NEGATIVE Proliferation Marker Ki67: 93%   10/01/2014 Initial Diagnosis Breast cancer of upper-inner quadrant of left female breast   10/17/2014 Imaging CT and bone scan negative for distant metastasis    10/24/2014 -  Adjuvant Chemotherapy ddACx4 then weekly Taxol X12     HISTORY OF PRESENTING ILLNESS:  Ashlee Hill  54 y.o. female presents to our multidisciplinary breast clinic today to discuss the management of her newly diagnosed breast cancer  She noticed a left breast lump in August 2015, no tenderness, skin or nipple change. She otherwise felt well. She did not seek immediate medical attention due to lack of insurance. She finally got her insurance approved and saw her primary care physician recently. She was referred for mammogram which showed a 2.5 cm irregular mass in the upper inner left breast. She underwent left breast mass and axillary node biopsy on 09/29/2014 and both biopsy showed invasive ductal carcinoma, ER negative, PR negative, HER-2 negative.  She otherwise feels well, no pain or ther complains. She has good appetite and her weight is stable.  CURRENT THERAPY: Neoadjuvant chemotherapy with ddAC every 2 weeks x4, followed by weekly Taxol 12, started on 10/24/2058  INTERIM HISTORY; [name] returns for follow-up and second cycle chemotherapy. She developed mouth pain about one week ago, likely mucositis from chemotherapy. She used magic mouthwash and symptoms gradually improved. She only has slight mouth sensitivity now, able to eat and drink well. She still kind of nervous, didn't sleep well last night. She denies any fever or chills no other complaints.  MEDICAL HISTORY:  Past Medical History  Diagnosis Date  . Breast cancer of upper-inner quadrant of left female breast 10/01/2014  . Hypertension   . Family history of breast cancer   . Wears glasses     driving    SURGICAL HISTORY: Past Surgical History  Procedure Laterality Date  . Tonsillectomy    . Wisdom tooth  extraction    . Portacath placement Right 10/21/2014    Procedure: INSERTION PORT-A-CATH;  Surgeon: Erroll Luna, MD;  Location: [facility];  Service: General;  Laterality: Right;     GYN HISTORY  Menarchal: 11 LMP: 09/27/2014 Contraceptive: no HRT:  G0P0    SOCIAL HISTORY: History   Social  History  . Marital Status: Single    Spouse Name: N/A  . Number of Children: N/A  . Years of Education: N/A   Occupational History  . Not on file.   Social History Main Topics  . Smoking status: Never Smoker   . Smokeless tobacco: Not on file  . Alcohol Use: 0.6 oz/week    1 Glasses of wine per week     Comment: socail drinker  . Drug Use: No  . Sexual Activity: Not on file   Other Topics Concern  . Not on file   Social History Narrative    FAMILY HISTORY: Family History  Problem Relation Age of Onset  . Prostate cancer Father 30    currently 46  . Breast cancer Paternal Aunt 60    deceased 9  . Lung cancer Maternal Grandfather 31    smoker; deceased  . Thyroid cancer Cousin 33    pat first cousin; currently 43     Father had prostate cancer at age of 29 Paternal aunt had breast caner in her 38's Paternal cousin had thyroid cancer at age of before 81 Maternal grandfather had lung cancer    ALLERGIES:  has No Known Allergies.  MEDICATIONS:  Current Outpatient Prescriptions  Medication Sig Dispense Refill  . ALPRAZolam (XANAX) 1 MG tablet Take 1-2 mg by mouth 2 (two) times daily. Take 1 tablet (1 mg) in the morning & Take 2 tablets (2 mg) in the evening.    . Alum & Mag Hydroxide-Simeth (MAGIC MOUTHWASH W/LIDOCAINE) SOLN Take 5 mLs by mouth 4 (four) times daily as needed for mouth pain. 120 mL 1  . aspirin 81 MG tablet Take 81 mg by mouth daily.    Marland Kitchen atorvastatin (LIPITOR) 10 MG tablet Take 10 mg by mouth daily.    . calcium carbonate (OS-CAL) 600 MG TABS tablet Take 600 mg by mouth daily with breakfast.    . gabapentin (NEURONTIN) 100 MG capsule Take 100 mg by mouth at bedtime.     . hydrochlorothiazide (HYDRODIURIL) 25 MG tablet Take 25 mg by mouth daily.     Marland Kitchen lidocaine-prilocaine (EMLA) cream Apply to affected area once 30 g 3  . LORazepam (ATIVAN) 1 MG tablet Take 1 tablet (1 mg total) by mouth at bedtime as needed for sleep. 30 tablet 0  .  menthol-cetylpyridinium (CEPACOL) 3 MG lozenge Take 1 lozenge by mouth every 2 (two) hours as needed for sore throat (sore throat).    . metoprolol (LOPRESSOR) 100 MG tablet Take 100 mg by mouth daily.    . ondansetron (ZOFRAN) 8 MG tablet Take 1 tablet (8 mg total) by mouth 2 (two) times daily as needed. Start on the third day after chemotherapy. 30 tablet 1  . oxyCODONE-acetaminophen (ROXICET) 5-325 MG per tablet Take 1-2 tablets by mouth every 4 (four) hours as needed. 30 tablet 0  . promethazine (PHENERGAN) 25 MG suppository Place 25 mg rectally every 8 (eight) hours as needed for nausea (nausea).     . venlafaxine XR (EFFEXOR-XR) 150 MG 24 hr capsule Take 150 mg by mouth daily with breakfast.     No current facility-administered medications  for this visit.    REVIEW OF SYSTEMS:   Constitutional: Denies fevers, chills or abnormal night sweats Eyes: Denies blurriness of vision, double vision or watery eyes Ears, nose, mouth, throat, and face: resolving mucositis and sore throat Respiratory: Denies cough, dyspnea or wheezes Cardiovascular: Denies palpitation, chest discomfort or lower extremity swelling Gastrointestinal:  Denies nausea, heartburn or change in bowel habits Skin: Denies abnormal skin rashes Lymphatics: Denies new lymphadenopathy or easy bruising Neurological:Denies numbness, tingling or new weaknesses Behavioral/Psych: Mood is stable, no new changes  All other systems were reviewed with the patient and are negative.  PHYSICAL EXAMINATION: ECOG PERFORMANCE STATUS: 0 - Asymptomatic  Filed Vitals:   11/07/14 1058  BP: 162/71  Pulse: 68  Temp: 98.2 F (36.8 C)  Resp: 18   Filed Weights   11/07/14 1058  Weight: 233 lb 12.8 oz (106.051 kg)    GENERAL:alert, no distress and comfortable SKIN: skin color, texture, turgor are normal, no rashes or significant lesions. Port site looks clean  EYES: normal, conjunctiva are pink and non-injected, sclera clear OROPHARYNX:no  exudate, no erythema and lips, buccal mucosa, and tongue normal  NECK: supple, thyroid normal size, non-tender, without nodularity LYMPH:  no palpable lymphadenopathy in the cervical, axillary or inguinal LUNGS: clear to auscultation and percussion with normal breathing effort HEART: regular rate & rhythm and no murmurs and no lower extremity edema ABDOMEN:abdomen soft, non-tender and normal bowel sounds Musculoskeletal:no cyanosis of digits and no clubbing  PSYCH: alert & oriented x 3 with fluent speech NEURO: no focal motor/sensory deficits Breasts: Breast inspection showed them to be symmetrical with no nipple discharge. Palpation of the left breasts showed a 4.5X3cm mass in the upper outer quadrant (smaller than before), no palpable axillary lymph node. Exam of the right breast and axillary was normal.    LABORATORY DATA:  I have reviewed the data as listed Lab Results  Component Value Date   WBC 5.9 11/07/2014   HGB 13.4 11/07/2014   HCT 37.8 11/07/2014   MCV 96.9 11/07/2014   PLT 214 11/07/2014    Recent Labs  10/08/14 0833 10/24/14 1042 11/01/14 1607 11/07/14 0931  NA 136 138 134* 137  K 4.4 3.7 3.9 4.4  CL  --   --  97  --   CO2 21* 25  --  24  GLUCOSE 144* 179* 216* 218*  BUN 15.2 10.0 10 11.9  CREATININE 0.9 1.0 0.70 0.9  CALCIUM 9.2 9.3  --  9.1  PROT 7.3 7.1  --  7.1  ALBUMIN 3.8 3.7  --  3.7  AST 25 28  --  21  ALT 25 29  --  32  ALKPHOS 98 79  --  117  BILITOT 0.52 0.64  --  0.45    PATHOLOGY REPORT 09/26/2014 Diagnosis 1. Breast, left, needle core biopsy, mass, 11 o'clock - INVASIVE MAMMARY CARCINOMA, SEE COMMENT. - MAMMARY CARCINOMA IN SITU. 2. Lymph node, needle/core biopsy, left axillary - ONE LYMPH NODE, POSITIVE FOR METASTATIC MAMMARY CARCINOMA (1/1). Microscopic Comment 1. Although grade of tumor is best assessed at resection, with these biopsies, both the in situ and invasive carcinoma are grade II. The invasive carcinoma demonstrates strong  diffuse E-cadherin expression; supporting a ductal phenotype. 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  Estrogen Receptor: 0%, NEGATIVE Progesterone Receptor: 0%, NEGATIVE Proliferation Marker Ki67: 93% 1. A sample was sent to [facility]  for HER-2 testing by FISH. The results are as follows: Negative.  RADIOGRAPHIC STUDIES: I have personally reviewed the radiological images as listed and agreed with the findings in the report.  Mr Breast Bilateral W Wo Contrast 10/08/2014    IMPRESSION: 1. Biopsy proven malignancy in the upper inner left breast with adjacent/contiguous areas of nodularity measures up to 3.5 cm, with multiple morphologically abnormal axillary lymph nodes compatible with known axillary metastases.  2. No MRI evidence of malignancy in the right breast.  RECOMMENDATION: Treatment plan for known left breast malignancy.  BI-RADS CATEGORY  6: Known biopsy-proven malignancy.   Electronically Signed   By: Everlean Alstrom M.D.   On: 10/08/2014 10:29   US Breast Ltd Uni Left Inc Axilla 09/26/2014    IMPRESSION: 2.5 cm irregular mass in the upper inner left breast with adjacent 1.1 cm satellite mass and enlarged left axillary lymph nodes, highly suspicious for left breast malignancy and left axillary lymph node metastases. Tissue sampling is recommended.  No mammographic evidence of right breast malignancy.    CT chest, abdomen and pelvis with contrast 10/17/2014 IMPRESSION: 1. Left breast lesion is identified compatible with the clinical history of breast cancer. 2. Enlarged left axillary lymph nodes suspicious for metastatic adenopathy. 3. No specific features identified to suggest distant metastatic disease. 4. Hepatic steatosis.  Bone scan 10/17/2014 IMPRESSION: 1. The uptake pattern within the skeleton is not highly suspicious for malignancy.  However, subtle increased uptake in the midshaft of the right humerus and the lower lumbar spine posteriorly merits further evaluation with plain films. 2. The uptake over the lower extremities is consistent with degenerative change.  Lumbar spine X-ray 10/20/14 IMPRESSION: No lytic or sclerotic osseous lesion.  Probable degenerative facet osteoarthritic change at L4-L5 and L5-S1 which may correspond to the area of abnormal uptake at recent bone scan.  RIGHT HUMERUS X-RAY 10/20/2014 IMPRESSION: No abnormality seen to correspond to abnormal uptake seen on bone scan   ASSESSMENT & PLAN:  54 year old pre-menopausal female with past medical history of hypertension, presented with a palpable left breast mass.  1. Left breast invasive ductal carcinoma, cT2N1M0, stage IIB, triple negative  -I discussed her all imaging finding and biopsy results extensively with patient. Her ultrasound and MRI breast reviewed multiple left axillary lymph nodes, at least N1 disease, possible N2. I discussed with her and she has locally advanced disease, and triple-negative breast cancer 10 to be more aggressive with early metastasis and cancer recurrence after surgery. -a staging CT showed no evidence of distant metastasis, the subtle increased uptake in the right humerus and lower lumbar spine on the bone scan was further evaluated by x-ray today, which were negative. - I recommend neoadjuvant chemotherapy given his very high risk of cancer recurrence after surgery alone. -She has no history of cardiac disease, normal EF on echo,  I would recommend dose dense Adriamycin and Cytoxan followed by paclitaxel.  -Given her young age and triple negative disease, positive family history, she was referred to see genetic counselor for genetic testing, her genetic testing results are still pending. -If she has positive BRCA1/2 mutation, she will be a good candidate for the NSABP B-55 clinical trial. -Her labs are  adequate, we'll proceed second cycle of before meals today.  2. Infertility -We discussed she may experience menopause with chemotherapy, the potentially not able to be pregnant afterwards. -She does not desire to have children in the future.  3.Anxiety and coping -she will take her xanax needed -I encouraged  her to think positive -She has good support from her parents and friend.   4. Mucositis secondary to chemotherapy -Near resolved. I encouraged her to use baking soda and salt mouth wash regularly after chemotherapy.  5. Hyperglycemia -I asked her to see her primary care physician as soon as possible -She is not on steroids before chemotherapy  -We discussed decrease carbohydrates and soft drinks, and exercise regularly  Plan -Second cycle AC today. -She will see one of our nurse production or before this cycle chemotherapy in 2 weeks, and see me in 4 weeks before 4th cycle chemo.  All questions were answered. The patient knows to call the clinic with any problems, questions or concerns. I spent 20 minutes counseling the patient face to face. The total time spent in the appointment was 25 minutes and more than 50% was on counseling.     Truitt Merle, MD 11/07/2014 11:25 AM

## 2014-11-07 NOTE — Patient Instructions (Signed)
[facility] Discharge Instructions for Patients Receiving Chemotherapy  Today you received the following chemotherapy agents Adriamycin/Cytoxan.   To help prevent nausea and vomiting after your treatment, we encourage you to take your nausea medication as directed.    If you develop nausea and vomiting that is not controlled by your nausea medication, call the clinic.   BELOW ARE SYMPTOMS THAT SHOULD BE REPORTED IMMEDIATELY:  *FEVER GREATER THAN 100.5 F  *CHILLS WITH OR WITHOUT FEVER  NAUSEA AND VOMITING THAT IS NOT CONTROLLED WITH YOUR NAUSEA MEDICATION  *UNUSUAL SHORTNESS OF BREATH  *UNUSUAL BRUISING OR BLEEDING  TENDERNESS IN MOUTH AND THROAT WITH OR WITHOUT PRESENCE OF ULCERS  *URINARY PROBLEMS  *BOWEL PROBLEMS  UNUSUAL RASH Items with * indicate a potential emergency and should be followed up as soon as possible.  Feel free to call the clinic you have any questions or concerns. The clinic phone number is [phone].    

## 2014-11-08 ENCOUNTER — Ambulatory Visit: Payer: 59

## 2014-11-14 ENCOUNTER — Encounter: Payer: Self-pay | Admitting: Genetic Counselor

## 2014-11-14 DIAGNOSIS — Z1379 Encounter for other screening for genetic and chromosomal anomalies: Secondary | ICD-10-CM | POA: Insufficient documentation

## 2014-11-14 NOTE — Progress Notes (Signed)
GENETIC TEST RESULTS  Patient Name: Ashlee Hill Patient Age: 54 y.o. Encounter Date: 11/14/2014  Referring Physician: Truitt Merle, MD   Ms. Sneath was called today to discuss genetic test results. Please see the Genetics note from her visit on 10/16/14 for a detailed discussion of her personal and family history.  GENETIC TESTING: At the time of Ms. Phimmasone's visit, we recommended she pursue genetic testing of multiple genes on the BreastNext gene panel. This test, which included sequencing and deletion/duplication analysis of 17 genes, was performed at [facility]. Testing was did not reveal any pathogenic mutation in these genes. The genes tested were ATM, BARD1, BRCA1, BRCA2, BRIP1, CDH1, CHEK2, MRE11A, MUTYH, NBN, NF1, PALB2, PTEN, RAD50, RAD51C, RAD51D, and TP53.  We discussed with Ms. Gan that since the current test is not perfect, it is possible there may be a gene mutation that current testing cannot detect, but that chance is small. We also discussed that it is possible that a different genetic factor, which was not part of this testing or has not yet been discovered, is responsible for the cancer diagnoses in the family. Should Ms. Tiano wish to discuss or pursue this additional testing, we are happy to coordinate this at any time, but do not feel that she is at significant risk of harboring a mutation in a different gene.     Genetic testing did detect two Variants of Unknown Significance: ATM p.K1435T (c.4304A>C) and RAD51C p.I52L (c.154A>C). At this time, it is unknown if these variantare are associated with increased cancer risk or if they are a normal finding, but most variants such as these get reclassified to being inconsequential. These should not be used to make medical management decisions. With time, we suspect the lab will determine the significance of these variants, if any. If we do learn more, we will try to contact Ms. Drumwright to discuss it further. However, it is  important to stay in touch with [place] periodically and keep the address and phone number up to date.  CANCER SCREENING: This result suggests that Ms. Buntrock's cancer was most likely not due to an inherited predisposition. Most cancers happen by chance and this test, along with details of her family history, suggests that her cancer falls into this category. We, therefore, recommended she continue to follow the cancer screening guidelines provided by her physician.   FAMILY MEMBERS: Ms. Artiga has no siblings and no children. Her mother is at some increased risk of developing breast cancer, over the general population risk, simply due to the family history. We recommended her mother continue having a yearly mammogram, a yearly clinical breast exam, and perform monthly breast self-exams. A gynecologic exam is recommended yearly. Colon cancer screening is recommended to begin by age 75.  Family members should not pursue testing for the above VUS outside of a research setting as it has no implications for their medical management.  Lastly, we discussed with Ms. Zylstra that cancer genetics is a rapidly advancing field and it is possible that new genetic tests will be appropriate for her in the future. We encouraged her to remain in contact with [place] on an annual basis so we can update her personal and family histories, and let her know of advances in cancer genetics that may benefit the family. Our contact number was provided. Ms. Ammar questions were answered to her satisfaction today, and she knows she is welcome to call anytime with additional questions.    Steele Berg, MS, [facility] Certified  Genetic Counselor phone: [phone] [email or web address]

## 2014-11-20 ENCOUNTER — Other Ambulatory Visit: Payer: Self-pay | Admitting: Hematology

## 2014-11-20 DIAGNOSIS — C50212 Malignant neoplasm of upper-inner quadrant of left female breast: Secondary | ICD-10-CM

## 2014-11-21 ENCOUNTER — Ambulatory Visit (HOSPITAL_BASED_OUTPATIENT_CLINIC_OR_DEPARTMENT_OTHER): Payer: 59

## 2014-11-21 ENCOUNTER — Other Ambulatory Visit (HOSPITAL_BASED_OUTPATIENT_CLINIC_OR_DEPARTMENT_OTHER): Payer: 59

## 2014-11-21 ENCOUNTER — Encounter: Payer: Self-pay | Admitting: Physician Assistant

## 2014-11-21 ENCOUNTER — Ambulatory Visit (HOSPITAL_BASED_OUTPATIENT_CLINIC_OR_DEPARTMENT_OTHER): Payer: 59 | Admitting: Physician Assistant

## 2014-11-21 VITALS — BP 160/78 | HR 93 | Temp 98.7°F | Resp 18 | Ht 65.0 in | Wt 233.4 lb

## 2014-11-21 DIAGNOSIS — Z5111 Encounter for antineoplastic chemotherapy: Secondary | ICD-10-CM

## 2014-11-21 DIAGNOSIS — C773 Secondary and unspecified malignant neoplasm of axilla and upper limb lymph nodes: Secondary | ICD-10-CM

## 2014-11-21 DIAGNOSIS — Z171 Estrogen receptor negative status [ER-]: Secondary | ICD-10-CM

## 2014-11-21 DIAGNOSIS — F411 Generalized anxiety disorder: Secondary | ICD-10-CM

## 2014-11-21 DIAGNOSIS — R739 Hyperglycemia, unspecified: Secondary | ICD-10-CM | POA: Diagnosis not present

## 2014-11-21 DIAGNOSIS — C50212 Malignant neoplasm of upper-inner quadrant of left female breast: Secondary | ICD-10-CM

## 2014-11-21 DIAGNOSIS — K1231 Oral mucositis (ulcerative) due to antineoplastic therapy: Secondary | ICD-10-CM

## 2014-11-21 LAB — COMPREHENSIVE METABOLIC PANEL (CC13)
ALK PHOS: 126 U/L (ref 40–150)
ALT: 28 U/L (ref 0–55)
ANION GAP: 10 meq/L (ref 3–11)
AST: 25 U/L (ref 5–34)
Albumin: 3.6 g/dL (ref 3.5–5.0)
BUN: 9.6 mg/dL (ref 7.0–26.0)
CALCIUM: 8.7 mg/dL (ref 8.4–10.4)
CHLORIDE: 105 meq/L (ref 98–109)
CO2: 22 meq/L (ref 22–29)
Creatinine: 0.9 mg/dL (ref 0.6–1.1)
EGFR: 72 mL/min/{1.73_m2} — AB (ref >90)
Glucose: 238 mg/dl — ABNORMAL HIGH (ref 70–140)
Potassium: 4.7 mEq/L (ref 3.5–5.1)
Sodium: 137 mEq/L (ref 136–145)
TOTAL PROTEIN: 6.6 g/dL (ref 6.4–8.3)
Total Bilirubin: 0.47 mg/dL (ref 0.20–1.20)

## 2014-11-21 LAB — CBC WITH DIFFERENTIAL/PLATELET
BASO%: 1.5 % (ref 0.0–2.0)
Basophils Absolute: 0.1 10*3/uL (ref 0.0–0.1)
EOS%: 0.4 % (ref 0.0–7.0)
Eosinophils Absolute: 0 10*3/uL (ref 0.0–0.5)
HCT: 30.9 % — ABNORMAL LOW (ref 34.8–46.6)
HGB: 10.8 g/dL — ABNORMAL LOW (ref 11.6–15.9)
LYMPH%: 21.4 % (ref 14.0–49.7)
MCH: 33.9 pg (ref 25.1–34.0)
MCHC: 35 g/dL (ref 31.5–36.0)
MCV: 96.9 fL (ref 79.5–101.0)
MONO#: 0.7 10*3/uL (ref 0.1–0.9)
MONO%: 14.5 % — AB (ref 0.0–14.0)
NEUT#: 3 10*3/uL (ref 1.5–6.5)
NEUT%: 62.2 % (ref 38.4–76.8)
NRBC: 6 % — AB (ref 0–0)
PLATELETS: 197 10*3/uL (ref 145–400)
RBC: 3.19 10*6/uL — AB (ref 3.70–5.45)
RDW: 14.6 % — ABNORMAL HIGH (ref 11.2–14.5)
WBC: 4.8 10*3/uL (ref 3.9–10.3)
lymph#: 1 10*3/uL (ref 0.9–3.3)

## 2014-11-21 MED ORDER — PEGFILGRASTIM 6 MG/0.6ML ~~LOC~~ PSKT
6.0000 mg | PREFILLED_SYRINGE | Freq: Once | SUBCUTANEOUS | Status: AC
  Administered 2014-11-21: 6 mg via SUBCUTANEOUS
  Filled 2014-11-21: qty 0.6

## 2014-11-21 MED ORDER — PALONOSETRON HCL INJECTION 0.25 MG/5ML
0.2500 mg | Freq: Once | INTRAVENOUS | Status: AC
  Administered 2014-11-21: 0.25 mg via INTRAVENOUS

## 2014-11-21 MED ORDER — PALONOSETRON HCL INJECTION 0.25 MG/5ML
INTRAVENOUS | Status: AC
  Filled 2014-11-21: qty 5

## 2014-11-21 MED ORDER — SODIUM CHLORIDE 0.9 % IJ SOLN
10.0000 mL | INTRAMUSCULAR | Status: DC | PRN
  Administered 2014-11-21: 10 mL
  Filled 2014-11-21: qty 10

## 2014-11-21 MED ORDER — DOXORUBICIN HCL CHEMO IV INJECTION 2 MG/ML
60.0000 mg/m2 | Freq: Once | INTRAVENOUS | Status: AC
  Administered 2014-11-21: 134 mg via INTRAVENOUS
  Filled 2014-11-21: qty 67

## 2014-11-21 MED ORDER — FOSAPREPITANT DIMEGLUMINE INJECTION 150 MG
Freq: Once | INTRAVENOUS | Status: AC
  Administered 2014-11-21: 12:00:00 via INTRAVENOUS
  Filled 2014-11-21: qty 5

## 2014-11-21 MED ORDER — HEPARIN SOD (PORK) LOCK FLUSH 100 UNIT/ML IV SOLN
500.0000 [IU] | Freq: Once | INTRAVENOUS | Status: AC | PRN
  Administered 2014-11-21: 500 [IU]
  Filled 2014-11-21: qty 5

## 2014-11-21 MED ORDER — SODIUM CHLORIDE 0.9 % IV SOLN
600.0000 mg/m2 | Freq: Once | INTRAVENOUS | Status: AC
  Administered 2014-11-21: 1340 mg via INTRAVENOUS
  Filled 2014-11-21: qty 67

## 2014-11-21 MED ORDER — SODIUM CHLORIDE 0.9 % IV SOLN
Freq: Once | INTRAVENOUS | Status: AC
  Administered 2014-11-21: 12:00:00 via INTRAVENOUS

## 2014-11-21 NOTE — Progress Notes (Signed)
1 week [facility]  [phone]) [phone] Fax:([phone]) [place] Note   Patient Care Team: Seward Carol, MD as PCP - General (Internal Medicine) Erroll Luna, MD as Consulting Physician (General Surgery) Truitt Merle, MD as Consulting Physician (Hematology) Thea Silversmith, MD as Consulting Physician (Radiation Oncology) Rockwell Germany, RN as Registered Nurse Mauro Kaufmann, RN as Registered Nurse 11/21/2014  CHIEF COMPLAINTS  Follow up breast cancer   Oncology History   Breast cancer of upper-inner quadrant of left female breast   Staging form: Breast, AJCC 7th Edition     Clinical stage from 10/08/2014: Stage IIB (T2, N1, M0) - Unsigned     Pathologic: Stage IIB (T2, N1, cM0) - Unsigned       Breast cancer of upper-inner quadrant of left female breast   09/26/2014 Breast US 2.5 cm irregular mass in the upper inner left breast with adjacent 1.1 cm satellite mass and enlarged left axillary lymph nodes, highly suspicious for left breast malignancy and left axillary lymph node metastases. Tissue sampling is recommended.    09/26/2014 Mammogram Spot compression views of the left breast and routine views of both breasts demonstrate a 2 x 2.5 cm irregular mass in the upper inner left breast.    09/29/2014 Initial Biopsy 1. Breast, left, needle core biopsy, mass, 11 o'clock - INVASIVE MAMMARY CARCINOMA, SEE COMMENT. - MAMMARY CARCINOMA IN SITU. 2. Lymph node, needle/core biopsy, left axillary - ONE LYMPH NODE, POSITIVE FOR METASTATIC MAMMARY CARCINOMA (1/1).   09/29/2014 Receptors her2 Estrogen Receptor: 0%, NEGATIVE Progesterone Receptor: 0%, NEGATIVE Proliferation Marker Ki67: 93%   10/01/2014 Initial Diagnosis Breast cancer of upper-inner quadrant of left female breast   10/17/2014 Imaging CT and bone scan negative for distant metastasis    10/24/2014 -  Adjuvant Chemotherapy ddACx4 then weekly Taxol X12     HISTORY OF PRESENTING ILLNESS:  Ashlee Hill 54  y.o. female presents to our multidisciplinary breast clinic today to discuss the management of her newly diagnosed breast cancer  She noticed a left breast lump in August 2015, no tenderness, skin or nipple change. She otherwise felt well. She did not seek immediate medical attention due to lack of insurance. She finally got her insurance approved and saw her primary care physician recently. She was referred for mammogram which showed a 2.5 cm irregular mass in the upper inner left breast. She underwent left breast mass and axillary node biopsy on 09/29/2014 and both biopsy showed invasive ductal carcinoma, ER negative, PR negative, HER-2 negative.  She otherwise feels well, no pain or ther complains. She has good appetite and her weight is stable.  CURRENT THERAPY: Neoadjuvant chemotherapy with ddAC every 2 weeks x4, followed by weekly Taxol 12, started on 10/24/2058. Status post 2 cycles  INTERIM HISTORY; [name] returns for follow-up and second cycle chemotherapy. She developed mouth and throat pain after each cycle of chemotherapy that lasts for about 4 days, likely mucositis from chemotherapy. She used magic mouthwash and symptoms gradually improved. She voices no mouth sensitivity now. She requests the Neulasta on pro device again. She utilizes with her last cycle of chemotherapy and it worked well for her and she tolerated it without any issues. She denies any fever or chills no other complaints.  MEDICAL HISTORY:  Past Medical History  Diagnosis Date  . Breast cancer of upper-inner quadrant of left female breast 10/01/2014  . Hypertension   . Family history of breast cancer   . Wears glasses  driving    SURGICAL HISTORY: Past Surgical History  Procedure Laterality Date  . Tonsillectomy    . Wisdom tooth extraction    . Portacath placement Right 10/21/2014    Procedure: INSERTION PORT-A-CATH;  Surgeon: Erroll Luna, MD;  Location: [facility];  Service: General;   Laterality: Right;     GYN HISTORY  Menarchal: 11 LMP: 09/27/2014 Contraceptive: no HRT:  G0P0    SOCIAL HISTORY: History   Social History  . Marital Status: Single    Spouse Name: N/A  . Number of Children: N/A  . Years of Education: N/A   Occupational History  . Not on file.   Social History Main Topics  . Smoking status: Never Smoker   . Smokeless tobacco: Not on file  . Alcohol Use: 0.6 oz/week    1 Glasses of wine per week     Comment: socail drinker  . Drug Use: No  . Sexual Activity: Not on file   Other Topics Concern  . Not on file   Social History Narrative    FAMILY HISTORY: Family History  Problem Relation Age of Onset  . Prostate cancer Father 47    currently 51  . Breast cancer Paternal Aunt 45    deceased 52  . Lung cancer Maternal Grandfather 68    smoker; deceased  . Thyroid cancer Cousin 45    pat first cousin; currently 53     Father had prostate cancer at age of 56 Paternal aunt had breast caner in her 72's Paternal cousin had thyroid cancer at age of before 52 Maternal grandfather had lung cancer    ALLERGIES:  is allergic to tegaderm ag mesh.  MEDICATIONS:  Current Outpatient Prescriptions  Medication Sig Dispense Refill  . ALPRAZolam (XANAX) 1 MG tablet Take 1-2 mg by mouth 2 (two) times daily. Take 1 tablet (1 mg) in the morning & Take 2 tablets (2 mg) in the evening.    . Alum & Mag Hydroxide-Simeth (MAGIC MOUTHWASH W/LIDOCAINE) SOLN Take 5 mLs by mouth 4 (four) times daily as needed for mouth pain. 120 mL 1  . aspirin 81 MG tablet Take 81 mg by mouth daily.    Marland Kitchen atorvastatin (LIPITOR) 10 MG tablet Take 10 mg by mouth daily.    . calcium carbonate (OS-CAL) 600 MG TABS tablet Take 600 mg by mouth daily with breakfast.    . gabapentin (NEURONTIN) 100 MG capsule Take 100 mg by mouth at bedtime.     . hydrochlorothiazide (HYDRODIURIL) 25 MG tablet Take 25 mg by mouth daily.     Marland Kitchen lidocaine-prilocaine (EMLA) cream Apply to affected  area once 30 g 3  . LORazepam (ATIVAN) 1 MG tablet Take 1 tablet (1 mg total) by mouth at bedtime as needed for sleep. 30 tablet 0  . menthol-cetylpyridinium (CEPACOL) 3 MG lozenge Take 1 lozenge by mouth every 2 (two) hours as needed for sore throat (sore throat).    . metoprolol (LOPRESSOR) 100 MG tablet Take 100 mg by mouth daily.    . ondansetron (ZOFRAN) 8 MG tablet Take 1 tablet (8 mg total) by mouth 2 (two) times daily as needed. Start on the third day after chemotherapy. 30 tablet 1  . oxyCODONE-acetaminophen (ROXICET) 5-325 MG per tablet Take 1-2 tablets by mouth every 4 (four) hours as needed. 30 tablet 0  . promethazine (PHENERGAN) 25 MG suppository Place 25 mg rectally every 8 (eight) hours as needed for nausea (nausea).     Marland Kitchen  venlafaxine XR (EFFEXOR-XR) 150 MG 24 hr capsule Take 150 mg by mouth daily with breakfast.     No current facility-administered medications for this visit.   Facility-Administered Medications Ordered in Other Visits  Medication Dose Route Frequency Provider Last Rate Last Dose  . sodium chloride 0.9 % injection 10 mL  10 mL Intracatheter PRN Truitt Merle, MD   10 mL at 11/21/14 1308    REVIEW OF SYSTEMS:   Constitutional: Denies fevers, chills or abnormal night sweats Eyes: Denies blurriness of vision, double vision or watery eyes Ears, nose, mouth, throat, and face: recurrent mucositis and sore throat after each cycle of chemotherapy Respiratory: Denies cough, dyspnea or wheezes Cardiovascular: Denies palpitation, chest discomfort or lower extremity swelling Gastrointestinal:  Denies nausea, heartburn or change in bowel habits Skin: Denies abnormal skin rashes Lymphatics: Denies new lymphadenopathy or easy bruising Neurological:Denies numbness, tingling or new weaknesses Behavioral/Psych: Mood is stable, no new changes  All other systems were reviewed with the patient and are negative.  PHYSICAL EXAMINATION: ECOG PERFORMANCE STATUS: 0 -  Asymptomatic  Filed Vitals:   11/21/14 1022  BP: 160/78  Pulse: 93  Temp: 98.7 F (37.1 C)  Resp: 18   Filed Weights   11/21/14 1022  Weight: 233 lb 6.4 oz (105.87 kg)    GENERAL:alert, no distress and comfortable SKIN: skin color, texture, turgor are normal, no rashes or significant lesions. Port site looks clean  EYES: normal, conjunctiva are pink and non-injected, sclera clear OROPHARYNX:no exudate, no erythema and lips, buccal mucosa, and tongue normal  NECK: supple, thyroid normal size, non-tender, without nodularity LYMPH:  no palpable lymphadenopathy in the cervical, axillary or inguinal LUNGS: clear to auscultation and percussion with normal breathing effort HEART: regular rate & rhythm and no murmurs and no lower extremity edema ABDOMEN:abdomen soft, non-tender and normal bowel sounds Musculoskeletal:no cyanosis of digits and no clubbing  PSYCH: alert & oriented x 3 with fluent speech NEURO: no focal motor/sensory deficits Breasts: Breast inspection showed them to be symmetrical with no nipple discharge. Palpation of the left breasts showed an approximately 0.5 cm mass in the upper outer quadrant (smaller than before), no palpable axillary lymph node. Exam of the right breast and axillary was normal.    LABORATORY DATA:  I have reviewed the data as listed Lab Results  Component Value Date   WBC 4.8 11/21/2014   HGB 10.8* 11/21/2014   HCT 30.9* 11/21/2014   MCV 96.9 11/21/2014   PLT 197 11/21/2014    Recent Labs  10/24/14 1042 11/01/14 1607 11/07/14 0931 11/21/14 1002  NA 138 134* 137 137  K 3.7 3.9 4.4 4.7  CL  --  97  --   --   CO2 25  --  24 22  GLUCOSE 179* 216* 218* 238*  BUN 10.0 10 11.9 9.6  CREATININE 1.0 0.70 0.9 0.9  CALCIUM 9.3  --  9.1 8.7  PROT 7.1  --  7.1 6.6  ALBUMIN 3.7  --  3.7 3.6  AST 28  --  21 25  ALT 29  --  32 28  ALKPHOS 79  --  117 126  BILITOT 0.64  --  0.45 0.47    PATHOLOGY REPORT 09/26/2014 Diagnosis 1. Breast, left,  needle core biopsy, mass, 11 o'clock - INVASIVE MAMMARY CARCINOMA, SEE COMMENT. - MAMMARY CARCINOMA IN SITU. 2. Lymph node, needle/core biopsy, left axillary - ONE LYMPH NODE, POSITIVE FOR METASTATIC MAMMARY CARCINOMA (1/1). Microscopic Comment 1. Although grade of tumor is  best assessed at resection, with these biopsies, both the in situ and invasive carcinoma are grade II. The invasive carcinoma demonstrates strong diffuse E-cadherin expression; supporting a ductal phenotype. 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  Estrogen Receptor: 0%, NEGATIVE Progesterone Receptor: 0%, NEGATIVE Proliferation Marker Ki67: 93% 1. A sample was sent to [facility] for HER-2 testing by FISH. The results are as follows: Negative.  RADIOGRAPHIC STUDIES: I have personally reviewed the radiological images as listed and agreed with the findings in the report.  Mr Breast Bilateral W Wo Contrast 10/08/2014    IMPRESSION: 1. Biopsy proven malignancy in the upper inner left breast with adjacent/contiguous areas of nodularity measures up to 3.5 cm, with multiple morphologically abnormal axillary lymph nodes compatible with known axillary metastases.  2. No MRI evidence of malignancy in the right breast.  RECOMMENDATION: Treatment plan for known left breast malignancy.  BI-RADS CATEGORY  6: Known biopsy-proven malignancy.   Electronically Signed   By: Everlean Alstrom M.D.   On: 10/08/2014 10:29   US Breast Ltd Uni Left Inc Axilla 09/26/2014    IMPRESSION: 2.5 cm irregular mass in the upper inner left breast with adjacent 1.1 cm satellite mass and enlarged left axillary lymph nodes, highly suspicious for left breast malignancy and left axillary lymph node metastases. Tissue sampling is recommended.  No mammographic evidence of right breast malignancy.    CT chest, abdomen and pelvis  with contrast 10/17/2014 IMPRESSION: 1. Left breast lesion is identified compatible with the clinical history of breast cancer. 2. Enlarged left axillary lymph nodes suspicious for metastatic adenopathy. 3. No specific features identified to suggest distant metastatic disease. 4. Hepatic steatosis.  Bone scan 10/17/2014 IMPRESSION: 1. The uptake pattern within the skeleton is not highly suspicious for malignancy. However, subtle increased uptake in the midshaft of the right humerus and the lower lumbar spine posteriorly merits further evaluation with plain films. 2. The uptake over the lower extremities is consistent with degenerative change.  Lumbar spine X-ray 10/20/14 IMPRESSION: No lytic or sclerotic osseous lesion.  Probable degenerative facet osteoarthritic change at L4-L5 and L5-S1 which may correspond to the area of abnormal uptake at recent bone scan.  RIGHT HUMERUS X-RAY 10/20/2014 IMPRESSION: No abnormality seen to correspond to abnormal uptake seen on bone scan   ASSESSMENT & PLAN:  54 year old pre-menopausal female with past medical history of hypertension, presented with a palpable left breast mass.  1. Left breast invasive ductal carcinoma, cT2N1M0, stage IIB, triple negative  -I discussed her all imaging finding and biopsy results extensively with patient. Her ultrasound and MRI breast reviewed multiple left axillary lymph nodes, at least N1 disease, possible N2. I discussed with her and she has locally advanced disease, and triple-negative breast cancer 10 to be more aggressive with early metastasis and cancer recurrence after surgery. -a staging CT showed no evidence of distant metastasis, the subtle increased uptake in the right humerus and lower lumbar spine on the bone scan was further evaluated by x-ray today, which were negative. - I recommend neoadjuvant chemotherapy given his very high risk of cancer recurrence after surgery alone. -She has no history  of cardiac disease, normal EF on echo,  I would recommend dose dense Adriamycin and Cytoxan followed by paclitaxel.  -Given her young age and triple negative disease, positive family history, she was referred to see genetic counselor for genetic testing, her genetic testing results are  still pending. -If she has positive BRCA1/2 mutation, she will be a good candidate for the NSABP B-55 clinical trial. -Her labs are adequate, we'll proceed second cycle of before meals today.  2. Infertility -We discussed she may experience menopause with chemotherapy, the potentially not able to be pregnant afterwards. -She does not desire to have children in the future.  3.Anxiety and coping -she will take her xanax as needed -She continues to think positively -She has good support from her parents and friend.   4. Mucositis secondary to chemotherapy -Resolved. She will continue to use Magic mouthwash as well as baking soda and salt water gargles as previously instructed after chemotherapy.  5. Hyperglycemia -I asked her to see her primary care physician as soon as possible -She is not on steroids before chemotherapy  -We discussed decrease carbohydrates and soft drinks, and exercise regularly  Plan -Third cycle AC today. We will change her Neulasta injection to the Neulasta on pro device for this and  cycle #4 -She will see Dr. Burr Medico in 2 weeks prior to cycle #4.   All questions were answered. The patient knows to call the clinic with any problems, questions or concerns. I spent 20 minutes counseling the patient face to face. The total time spent in the appointment was 25 minutes and more than 50% was on counseling.     Carlton Adam, PA-C 11/21/2014 3:08 PM

## 2014-11-21 NOTE — Patient Instructions (Signed)
[facility] Discharge Instructions for Patients Receiving Chemotherapy  Today you received the following chemotherapy agents Adriamycin/Cytoxan.   To help prevent nausea and vomiting after your treatment, we encourage you to take your nausea medication as directed.    If you develop nausea and vomiting that is not controlled by your nausea medication, call the clinic.   BELOW ARE SYMPTOMS THAT SHOULD BE REPORTED IMMEDIATELY:  *FEVER GREATER THAN 100.5 F  *CHILLS WITH OR WITHOUT FEVER  NAUSEA AND VOMITING THAT IS NOT CONTROLLED WITH YOUR NAUSEA MEDICATION  *UNUSUAL SHORTNESS OF BREATH  *UNUSUAL BRUISING OR BLEEDING  TENDERNESS IN MOUTH AND THROAT WITH OR WITHOUT PRESENCE OF ULCERS  *URINARY PROBLEMS  *BOWEL PROBLEMS  UNUSUAL RASH Items with * indicate a potential emergency and should be followed up as soon as possible.  Feel free to call the clinic you have any questions or concerns. The clinic phone number is [phone].    

## 2014-11-22 ENCOUNTER — Ambulatory Visit: Payer: 59

## 2014-11-25 NOTE — Patient Instructions (Signed)
Follow up in 2 weeks prior to your next scheduled cycle of chemotherapy

## 2014-12-01 ENCOUNTER — Telehealth: Payer: Self-pay | Admitting: *Deleted

## 2014-12-01 DIAGNOSIS — C50212 Malignant neoplasm of upper-inner quadrant of left female breast: Secondary | ICD-10-CM

## 2014-12-01 NOTE — Telephone Encounter (Signed)
Ashlee Hill called reporting side effects from Chemotherapy.  Has received three cycles of A/C  1. Mass: "I can feel lumps in my right arm pit and we're treating my left cancer.  These lumps one was red and tender.  The redness has gone away but the tenderness comes and goes."  2. Pain: "I have extreme pain on the soles of my feet for days.  I can hardly walk." 3. Fatigue: "I am unbelievably weak.  Can barely get out of bed for short periods of time.  To shower, go out to the store.  I live alone." 4. Mucositis:  "I was given MMW which doesn't do a thing but numb the area for a while.  I now have a knot in my throat and my esophagus feels tight.  It's a challenge to get food down."  Will submit P.O.F. for symptom management clinic visit tomorrow at 10:00 am.  Does not think she is drinking enough fluids and trying to force herself to eat but denies appetite.  Lives in an apartment and there are steps outside but she doesn't use steps.  Advised to use warm water and wear shoes that fit to have no friction to feet.

## 2014-12-01 NOTE — Telephone Encounter (Signed)
I will see her with Cyndee tomorrow then. Truitt Merle

## 2014-12-02 ENCOUNTER — Other Ambulatory Visit: Payer: 59

## 2014-12-02 ENCOUNTER — Telehealth: Payer: Self-pay | Admitting: Hematology

## 2014-12-02 ENCOUNTER — Encounter: Payer: 59 | Admitting: Nurse Practitioner

## 2014-12-02 NOTE — Telephone Encounter (Signed)
Patient called in and canceled appointment. Patient stated she felt better and was able to eat and drink last night and that she could see Dr.Feng 04/15. I spoke with triage and said if patient feels she can wait till Friday she can. Patient confirmed she felt better and confirmed appointment for 04/15 with Dr.Feng will call if anything changes.

## 2014-12-03 NOTE — Telephone Encounter (Signed)
Patient called and cancel;led appointment. "Feels better and will keep previously scheduled appointment per scheduler.

## 2014-12-04 ENCOUNTER — Other Ambulatory Visit (HOSPITAL_BASED_OUTPATIENT_CLINIC_OR_DEPARTMENT_OTHER): Payer: 59

## 2014-12-04 ENCOUNTER — Other Ambulatory Visit: Payer: Self-pay | Admitting: *Deleted

## 2014-12-04 ENCOUNTER — Encounter: Payer: Self-pay | Admitting: Nurse Practitioner

## 2014-12-04 ENCOUNTER — Encounter (HOSPITAL_COMMUNITY): Payer: Self-pay | Admitting: *Deleted

## 2014-12-04 ENCOUNTER — Ambulatory Visit (HOSPITAL_BASED_OUTPATIENT_CLINIC_OR_DEPARTMENT_OTHER): Payer: 59 | Admitting: Nurse Practitioner

## 2014-12-04 ENCOUNTER — Inpatient Hospital Stay (HOSPITAL_COMMUNITY)
Admission: AD | Admit: 2014-12-04 | Discharge: 2014-12-06 | DRG: 812 | Disposition: A | Discharge status: Home / Self Care | Payer: 59 | Source: Ambulatory Visit | Attending: Internal Medicine | Admitting: Internal Medicine

## 2014-12-04 ENCOUNTER — Telehealth: Payer: Self-pay | Admitting: *Deleted

## 2014-12-04 ENCOUNTER — Other Ambulatory Visit (HOSPITAL_COMMUNITY)
Admission: RE | Admit: 2014-12-04 | Discharge: 2014-12-04 | Disposition: A | Discharge status: Home / Self Care | Payer: 59 | Source: Ambulatory Visit | Attending: Hematology | Admitting: Hematology

## 2014-12-04 VITALS — BP 144/89 | HR 116 | Temp 98.0°F | Resp 18 | Wt 223.0 lb

## 2014-12-04 DIAGNOSIS — R112 Nausea with vomiting, unspecified: Secondary | ICD-10-CM | POA: Diagnosis present

## 2014-12-04 DIAGNOSIS — E860 Dehydration: Secondary | ICD-10-CM | POA: Diagnosis present

## 2014-12-04 DIAGNOSIS — E785 Hyperlipidemia, unspecified: Secondary | ICD-10-CM | POA: Diagnosis present

## 2014-12-04 DIAGNOSIS — Z7982 Long term (current) use of aspirin: Secondary | ICD-10-CM

## 2014-12-04 DIAGNOSIS — D6481 Anemia due to antineoplastic chemotherapy: Secondary | ICD-10-CM | POA: Diagnosis present

## 2014-12-04 DIAGNOSIS — E871 Hypo-osmolality and hyponatremia: Secondary | ICD-10-CM | POA: Diagnosis present

## 2014-12-04 DIAGNOSIS — E1165 Type 2 diabetes mellitus with hyperglycemia: Secondary | ICD-10-CM | POA: Diagnosis present

## 2014-12-04 DIAGNOSIS — T451X5A Adverse effect of antineoplastic and immunosuppressive drugs, initial encounter: Secondary | ICD-10-CM

## 2014-12-04 DIAGNOSIS — F419 Anxiety disorder, unspecified: Secondary | ICD-10-CM | POA: Diagnosis present

## 2014-12-04 DIAGNOSIS — C773 Secondary and unspecified malignant neoplasm of axilla and upper limb lymph nodes: Secondary | ICD-10-CM | POA: Diagnosis not present

## 2014-12-04 DIAGNOSIS — C50212 Malignant neoplasm of upper-inner quadrant of left female breast: Secondary | ICD-10-CM

## 2014-12-04 DIAGNOSIS — L989 Disorder of the skin and subcutaneous tissue, unspecified: Secondary | ICD-10-CM | POA: Insufficient documentation

## 2014-12-04 DIAGNOSIS — Z171 Estrogen receptor negative status [ER-]: Secondary | ICD-10-CM

## 2014-12-04 DIAGNOSIS — Z95828 Presence of other vascular implants and grafts: Secondary | ICD-10-CM

## 2014-12-04 DIAGNOSIS — R739 Hyperglycemia, unspecified: Secondary | ICD-10-CM | POA: Diagnosis not present

## 2014-12-04 DIAGNOSIS — E119 Type 2 diabetes mellitus without complications: Secondary | ICD-10-CM

## 2014-12-04 DIAGNOSIS — K1231 Oral mucositis (ulcerative) due to antineoplastic therapy: Secondary | ICD-10-CM | POA: Diagnosis not present

## 2014-12-04 DIAGNOSIS — E861 Hypovolemia: Secondary | ICD-10-CM | POA: Diagnosis present

## 2014-12-04 DIAGNOSIS — Z79899 Other long term (current) drug therapy: Secondary | ICD-10-CM | POA: Diagnosis not present

## 2014-12-04 DIAGNOSIS — Z452 Encounter for adjustment and management of vascular access device: Secondary | ICD-10-CM

## 2014-12-04 DIAGNOSIS — D630 Anemia in neoplastic disease: Secondary | ICD-10-CM | POA: Diagnosis present

## 2014-12-04 DIAGNOSIS — N289 Disorder of kidney and ureter, unspecified: Secondary | ICD-10-CM | POA: Insufficient documentation

## 2014-12-04 DIAGNOSIS — C50219 Malignant neoplasm of upper-inner quadrant of unspecified female breast: Secondary | ICD-10-CM | POA: Diagnosis present

## 2014-12-04 DIAGNOSIS — R630 Anorexia: Secondary | ICD-10-CM | POA: Insufficient documentation

## 2014-12-04 DIAGNOSIS — N179 Acute kidney failure, unspecified: Secondary | ICD-10-CM | POA: Diagnosis present

## 2014-12-04 DIAGNOSIS — R17 Unspecified jaundice: Secondary | ICD-10-CM | POA: Diagnosis present

## 2014-12-04 DIAGNOSIS — F411 Generalized anxiety disorder: Secondary | ICD-10-CM

## 2014-12-04 DIAGNOSIS — I10 Essential (primary) hypertension: Secondary | ICD-10-CM | POA: Diagnosis present

## 2014-12-04 HISTORY — DX: Type 2 diabetes mellitus without complications: E11.9

## 2014-12-04 LAB — COMPREHENSIVE METABOLIC PANEL
ALK PHOS: 92 U/L (ref 39–117)
ALT: 28 U/L (ref 0–35)
ANION GAP: 13 (ref 5–15)
AST: 36 U/L (ref 0–37)
Albumin: 4.5 g/dL (ref 3.5–5.2)
BUN: 10 mg/dL (ref 6–23)
CO2: 25 mmol/L (ref 19–32)
Calcium: 8.8 mg/dL (ref 8.4–10.5)
Chloride: 91 mmol/L — ABNORMAL LOW (ref 96–112)
Creatinine, Ser: 1.27 mg/dL — ABNORMAL HIGH (ref 0.50–1.10)
GFR calc non Af Amer: 48 mL/min — ABNORMAL LOW (ref >90)
GFR, EST AFRICAN AMERICAN: 55 mL/min — AB (ref >90)
GLUCOSE: 422 mg/dL — AB (ref 70–99)
POTASSIUM: 3.6 mmol/L (ref 3.5–5.1)
Sodium: 129 mmol/L — ABNORMAL LOW (ref 135–145)
TOTAL PROTEIN: 7.5 g/dL (ref 6.0–8.3)
Total Bilirubin: 1.3 mg/dL — ABNORMAL HIGH (ref 0.3–1.2)

## 2014-12-04 LAB — URINALYSIS, ROUTINE W REFLEX MICROSCOPIC
Bilirubin Urine: NEGATIVE
Glucose, UA: NEGATIVE mg/dL
Hgb urine dipstick: NEGATIVE
KETONES UR: NEGATIVE mg/dL
LEUKOCYTES UA: NEGATIVE
NITRITE: NEGATIVE
PH: 7 (ref 5.0–8.0)
Protein, ur: NEGATIVE mg/dL
Specific Gravity, Urine: 1.003 — ABNORMAL LOW (ref 1.005–1.030)
Urobilinogen, UA: 1 mg/dL (ref 0.0–1.0)

## 2014-12-04 LAB — RETICULOCYTES
RBC.: 2.02 MIL/uL — AB (ref 3.87–5.11)
Retic Count, Absolute: 206 10*3/uL — ABNORMAL HIGH (ref 19.0–186.0)
Retic Ct Pct: 10.2 % — ABNORMAL HIGH (ref 0.4–3.1)

## 2014-12-04 LAB — CBC WITH DIFFERENTIAL/PLATELET
BASO%: 1.8 % (ref 0.0–2.0)
Basophils Absolute: 0.1 10*3/uL (ref 0.0–0.1)
EOS%: 0 % (ref 0.0–7.0)
Eosinophils Absolute: 0 10*3/uL (ref 0.0–0.5)
HCT: 25.7 % — ABNORMAL LOW (ref 34.8–46.6)
HEMOGLOBIN: 8.9 g/dL — AB (ref 11.6–15.9)
LYMPH#: 0.6 10*3/uL — AB (ref 0.9–3.3)
LYMPH%: 11.5 % — ABNORMAL LOW (ref 14.0–49.7)
MCH: 34.2 pg — AB (ref 25.1–34.0)
MCHC: 34.6 g/dL (ref 31.5–36.0)
MCV: 98.8 fL (ref 79.5–101.0)
MONO#: 0.9 10*3/uL (ref 0.1–0.9)
MONO%: 17.9 % — ABNORMAL HIGH (ref 0.0–14.0)
NEUT#: 3.5 10*3/uL (ref 1.5–6.5)
NEUT%: 68.8 % (ref 38.4–76.8)
NRBC: 8 % — AB (ref 0–0)
Platelets: 199 10*3/uL (ref 145–400)
RBC: 2.6 10*6/uL — AB (ref 3.70–5.45)
RDW: 17.9 % — AB (ref 11.2–14.5)
WBC: 5.1 10*3/uL (ref 3.9–10.3)

## 2014-12-04 LAB — WHOLE BLOOD GLUCOSE: Glucose: 311 mg/dL — ABNORMAL HIGH (ref 70–100)

## 2014-12-04 LAB — GLUCOSE, CAPILLARY
GLUCOSE-CAPILLARY: 190 mg/dL — AB (ref 70–99)
Glucose-Capillary: 156 mg/dL — ABNORMAL HIGH (ref 70–99)

## 2014-12-04 LAB — MAGNESIUM: MAGNESIUM: 2 mg/dL (ref 1.5–2.5)

## 2014-12-04 LAB — BILIRUBIN, FRACTIONATED(TOT/DIR/INDIR)
BILIRUBIN DIRECT: 0.2 mg/dL (ref 0.0–0.5)
Indirect Bilirubin: 0.7 mg/dL (ref 0.3–0.9)
Total Bilirubin: 0.9 mg/dL (ref 0.3–1.2)

## 2014-12-04 MED ORDER — ONDANSETRON HCL 4 MG PO TABS: 4.0000 mg | ORAL_TABLET | Freq: Four times a day (QID) | ORAL | Status: DC | PRN

## 2014-12-04 MED ORDER — ONDANSETRON HCL 4 MG/2ML IJ SOLN: 4.0000 mg | Freq: Four times a day (QID) | INTRAMUSCULAR | Status: DC | PRN

## 2014-12-04 MED ORDER — MENTHOL 3 MG MT LOZG: 1.0000 | LOZENGE | OROMUCOSAL | Status: DC | PRN

## 2014-12-04 MED ORDER — METOPROLOL TARTRATE 100 MG PO TABS
100.0000 mg | ORAL_TABLET | Freq: Every day | ORAL | Status: DC
  Administered 2014-12-05 – 2014-12-06 (×2): 100 mg via ORAL
  Filled 2014-12-04 (×2): qty 1

## 2014-12-04 MED ORDER — ALPRAZOLAM 1 MG PO TABS
2.0000 mg | ORAL_TABLET | Freq: Every evening | ORAL | Status: DC
  Administered 2014-12-04: 2 mg via ORAL
  Filled 2014-12-04 (×2): qty 2

## 2014-12-04 MED ORDER — INSULIN REGULAR HUMAN 100 UNIT/ML IJ SOLN
20.0000 [IU] | Freq: Once | INTRAMUSCULAR | Status: AC
  Administered 2014-12-04: 20 [IU] via SUBCUTANEOUS
  Filled 2014-12-04: qty 0.2

## 2014-12-04 MED ORDER — ONDANSETRON HCL 40 MG/20ML IJ SOLN
Freq: Once | INTRAMUSCULAR | Status: AC
  Administered 2014-12-04: 12:00:00 via INTRAVENOUS
  Filled 2014-12-04: qty 4

## 2014-12-04 MED ORDER — ACETAMINOPHEN 325 MG PO TABS: 650.0000 mg | ORAL_TABLET | Freq: Four times a day (QID) | ORAL | Status: DC | PRN

## 2014-12-04 MED ORDER — ASPIRIN EC 81 MG PO TBEC
81.0000 mg | DELAYED_RELEASE_TABLET | Freq: Every day | ORAL | Status: DC
Start: 2014-12-04 — End: 2014-12-06
  Administered 2014-12-04 – 2014-12-06 (×3): 81 mg via ORAL
  Filled 2014-12-04 (×3): qty 1

## 2014-12-04 MED ORDER — SODIUM CHLORIDE 0.9 % IV SOLN
1500.0000 mL | INTRAVENOUS | Status: AC
  Administered 2014-12-04: 1500 mL via INTRAVENOUS

## 2014-12-04 MED ORDER — VENLAFAXINE HCL ER 150 MG PO CP24
150.0000 mg | ORAL_CAPSULE | Freq: Every day | ORAL | Status: DC
  Administered 2014-12-05 – 2014-12-06 (×2): 150 mg via ORAL
  Filled 2014-12-04 (×3): qty 1

## 2014-12-04 MED ORDER — GABAPENTIN 100 MG PO CAPS
100.0000 mg | ORAL_CAPSULE | Freq: Every day | ORAL | Status: DC
  Administered 2014-12-04 – 2014-12-05 (×2): 100 mg via ORAL
  Filled 2014-12-04 (×3): qty 1

## 2014-12-04 MED ORDER — SODIUM CHLORIDE 0.9 % IJ SOLN
10.0000 mL | INTRAMUSCULAR | Status: DC | PRN
  Administered 2014-12-04: 10 mL via INTRAVENOUS
  Filled 2014-12-04: qty 10

## 2014-12-04 MED ORDER — ENOXAPARIN SODIUM 40 MG/0.4ML ~~LOC~~ SOLN
40.0000 mg | SUBCUTANEOUS | Status: DC
Start: 2014-12-04 — End: 2014-12-06
  Administered 2014-12-04 – 2014-12-05 (×2): 40 mg via SUBCUTANEOUS
  Filled 2014-12-04 (×3): qty 0.4

## 2014-12-04 MED ORDER — INSULIN ASPART 100 UNIT/ML ~~LOC~~ SOLN: 0.0000 [IU] | Freq: Every day | SUBCUTANEOUS | Status: DC

## 2014-12-04 MED ORDER — DEXAMETHASONE 4 MG PO TABS: ORAL_TABLET | ORAL | Status: DC

## 2014-12-04 MED ORDER — POTASSIUM CHLORIDE 2 MEQ/ML IV SOLN
INTRAVENOUS | Status: DC
  Administered 2014-12-04 – 2014-12-06 (×3): via INTRAVENOUS
  Filled 2014-12-04 (×7): qty 1000

## 2014-12-04 MED ORDER — LORAZEPAM 1 MG PO TABS: 1.0000 mg | ORAL_TABLET | Freq: Every evening | ORAL | Status: DC | PRN

## 2014-12-04 MED ORDER — ATORVASTATIN CALCIUM 10 MG PO TABS
10.0000 mg | ORAL_TABLET | Freq: Every day | ORAL | Status: DC
  Administered 2014-12-04 – 2014-12-06 (×3): 10 mg via ORAL
  Filled 2014-12-04 (×3): qty 1

## 2014-12-04 MED ORDER — ACETAMINOPHEN 650 MG RE SUPP: 650.0000 mg | Freq: Four times a day (QID) | RECTAL | Status: DC | PRN

## 2014-12-04 MED ORDER — ONDANSETRON HCL 8 MG PO TABS: 8.0000 mg | ORAL_TABLET | Freq: Three times a day (TID) | ORAL | Status: DC | PRN

## 2014-12-04 MED ORDER — PROCHLORPERAZINE MALEATE 10 MG PO TABS: 10.0000 mg | ORAL_TABLET | Freq: Four times a day (QID) | ORAL | Status: DC | PRN

## 2014-12-04 MED ORDER — INSULIN ASPART 100 UNIT/ML ~~LOC~~ SOLN: 0.0000 [IU] | Freq: Three times a day (TID) | SUBCUTANEOUS | Status: DC

## 2014-12-04 MED ORDER — ALPRAZOLAM 1 MG PO TABS
1.0000 mg | ORAL_TABLET | Freq: Every morning | ORAL | Status: DC
  Administered 2014-12-05: 1 mg via ORAL
  Filled 2014-12-04: qty 2

## 2014-12-04 MED ORDER — INSULIN ASPART 100 UNIT/ML ~~LOC~~ SOLN
0.0000 [IU] | Freq: Three times a day (TID) | SUBCUTANEOUS | Status: DC
  Administered 2014-12-04: 3 [IU] via SUBCUTANEOUS
  Administered 2014-12-05 (×2): 5 [IU] via SUBCUTANEOUS
  Administered 2014-12-05: 3 [IU] via SUBCUTANEOUS
  Administered 2014-12-06: 5 [IU] via SUBCUTANEOUS

## 2014-12-04 NOTE — Progress Notes (Signed)
SYMPTOM MANAGEMENT CLINIC   HPI: Ashlee Hill 54 y.o. female diagnosed with breast cancer.  Currently undergoing dose dense AC chemotherapy regimen.  Patient called the cancer today requesting urgent care visit.  Patient is complaining of nausea/vomiting, minimal appetite, and very poor oral intake.  She feels dehydrated today.  She has been taking Zofran with minimal effectiveness.  She states she has run out of the Zofran; and is also run out of of the Phenergan suppositories she was taking as well.  She denies any diarrhea or constipation.  Patient is also complaining of increased fatigue; but denies any recent fevers or chills.  Also, patient has noted several lesions to her bilateral axilla regions.  She states these lesions are somewhat tender.  Patient is requesting a refill of her Zofran and her Phenergan suppositories today.   HPI  ROS  Past Medical History  Diagnosis Date  . Breast cancer of upper-inner quadrant of left female breast 10/01/2014  . Hypertension   . Family history of breast cancer   . Wears glasses     driving  . Diabetes mellitus without complication 10/04/06    chemo caused diabetes per pt     Past Surgical History  Procedure Laterality Date  . Tonsillectomy    . Wisdom tooth extraction    . Portacath placement Right 10/21/2014    Procedure: INSERTION PORT-A-CATH;  Surgeon: Erroll Luna, MD;  Location: [facility];  Service: General;  Laterality: Right;    has Breast cancer of upper-inner quadrant of left female breast; Family history of breast cancer; Genetic testing; Dehydration; Nausea with vomiting; Anorexia; Hyperglycemia; Renal insufficiency; Hyperbilirubinemia; Antineoplastic chemotherapy induced anemia; and Skin lesion on her problem list.    is allergic to tegaderm ag mesh.    Medication List       This list is accurate as of: 12/04/14  4:20 PM.  Always use your most recent med list.               ALPRAZolam 1  MG tablet  Commonly known as:  XANAX  Take 1-2 mg by mouth 2 (two) times daily. Take 1 tablet (1 mg) in the morning & Take 2 tablets (2 mg) in the evening.     aspirin 81 MG tablet  Take 81 mg by mouth daily.     atorvastatin 10 MG tablet  Commonly known as:  LIPITOR  Take 10 mg by mouth daily.     calcium carbonate 600 MG Tabs tablet  Commonly known as:  OS-CAL  Take 600 mg by mouth daily with breakfast.     dexamethasone 4 MG tablet  Commonly known as:  DECADRON  1/2 tablet (2 mg) Q AM for nausea PRN.     gabapentin 100 MG capsule  Commonly known as:  NEURONTIN  Take 100 mg by mouth daily as needed (for sleep or anxiety).     gabapentin 600 MG tablet  Commonly known as:  NEURONTIN  Take 600 mg by mouth at bedtime. As needed for sleep or anxiety     hydrochlorothiazide 25 MG tablet  Commonly known as:  HYDRODIURIL  Take 25 mg by mouth daily.     lidocaine-prilocaine cream  Commonly known as:  EMLA  Apply to affected area once     LORazepam 1 MG tablet  Commonly known as:  ATIVAN  Take 1 tablet (1 mg total) by mouth at bedtime as needed for sleep.     magic mouthwash w/lidocaine  Soln  Take 5 mLs by mouth 4 (four) times daily as needed for mouth pain.     menthol-cetylpyridinium 3 MG lozenge  Commonly known as:  CEPACOL  Take 1 lozenge by mouth every 2 (two) hours as needed for sore throat (sore throat).     metoprolol 100 MG tablet  Commonly known as:  LOPRESSOR  Take 100 mg by mouth daily.     ondansetron 8 MG tablet  Commonly known as:  ZOFRAN  Take 1 tablet (8 mg total) by mouth every 8 (eight) hours as needed for nausea. Start on the third day after chemotherapy.     oxyCODONE-acetaminophen 5-325 MG per tablet  Commonly known as:  ROXICET  Take 1-2 tablets by mouth every 4 (four) hours as needed.     prochlorperazine 10 MG tablet  Commonly known as:  COMPAZINE  Take 1 tablet (10 mg total) by mouth every 6 (six) hours as needed for nausea or vomiting.      venlafaxine XR 150 MG 24 hr capsule  Commonly known as:  EFFEXOR-XR  Take 150 mg by mouth daily with breakfast.         PHYSICAL EXAMINATION  Oncology Vitals 12/04/2014 12/04/2014 12/04/2014 11/21/2014 11/07/2014 11/01/2014 11/01/2014  Height 165 cm - - 165 cm 165 cm - -  Weight 101.152 kg - 101.152 kg 105.87 kg 106.051 kg - -  Weight (lbs) 223 lbs - 223 lbs 233 lbs 6 oz 233 lbs 13 oz - -  BMI (kg/m2) 37.11 kg/m2 - - 38.84 kg/m2 38.91 kg/m2 - -  Temp - 98.6 98 98.7 98.2 99.1 99.8  Pulse - 96 116 93 68 97 -  Resp - '19 18 18 18 22 ' -  SpO2 - 99 100 97 - 97 -  BSA (m2) 2.15 m2 - - 2.2 m2 2.21 m2 - -   BP Readings from Last 3 Encounters:  12/04/14 153/84  12/04/14 144/89  11/21/14 160/78    Physical Exam  Constitutional: She is oriented to person, place, and time. She appears dehydrated. She appears unhealthy. She has a sickly appearance.  HENT:  Head: Normocephalic and atraumatic.  Mouth/Throat: Oropharynx is clear and moist.  Eyes: Conjunctivae and EOM are normal. Pupils are equal, round, and reactive to light. Right eye exhibits no discharge. Left eye exhibits no discharge. No scleral icterus.  Neck: Normal range of motion. Neck supple. No JVD present. No tracheal deviation present. No thyromegaly present.  Cardiovascular: Normal heart sounds and intact distal pulses.   Tachycardic rate  Pulmonary/Chest: Effort normal and breath sounds normal. No respiratory distress. She has no wheezes. She has no rales. She exhibits no tenderness.  Abdominal: Soft. Bowel sounds are normal. She exhibits no distension and no mass. There is no tenderness. There is no rebound and no guarding.  Musculoskeletal: Normal range of motion. She exhibits no edema or tenderness.  Lymphadenopathy:    She has no cervical adenopathy.  Neurological: She is alert and oriented to person, place, and time. Gait normal.  Skin: Skin is warm and dry. No rash noted. No erythema. There is pallor.  Psychiatric:  Patient  appears very anxious.  Nursing note and vitals reviewed.   LABORATORY DATA:. Appointment on 12/04/2014  Component Date Value Ref Range Status  . Glucose 12/04/2014 311* 70 - 100 mg/dL Final  . HRS PC 12/04/2014 Fasting   Final  Hospital Outpatient Visit on 12/04/2014  Component Date Value Ref Range Status  . Sodium 12/04/2014 129* 135 -  145 mmol/L Final  . Potassium 12/04/2014 3.6  3.5 - 5.1 mmol/L Final  . Chloride 12/04/2014 91* 96 - 112 mmol/L Final  . CO2 12/04/2014 25  19 - 32 mmol/L Final  . Glucose, Bld 12/04/2014 422* 70 - 99 mg/dL Final  . BUN 12/04/2014 10  6 - 23 mg/dL Final  . Creatinine, Ser 12/04/2014 1.27* 0.50 - 1.10 mg/dL Final  . Calcium 12/04/2014 8.8  8.4 - 10.5 mg/dL Final  . Total Protein 12/04/2014 7.5  6.0 - 8.3 g/dL Final  . Albumin 12/04/2014 4.5  3.5 - 5.2 g/dL Final  . AST 12/04/2014 36  0 - 37 U/L Final  . ALT 12/04/2014 28  0 - 35 U/L Final  . Alkaline Phosphatase 12/04/2014 92  39 - 117 U/L Final  . Total Bilirubin 12/04/2014 1.3* 0.3 - 1.2 mg/dL Final  . GFR calc non Af Amer 12/04/2014 48* >90 mL/min Final  . GFR calc Af Amer 12/04/2014 55* >90 mL/min Final   Comment: (NOTE) The eGFR has been calculated using the CKD EPI equation. This calculation has not been validated in all clinical situations. eGFR's persistently <90 mL/min signify possible Chronic Kidney Disease.   . Anion gap 12/04/2014 13  5 - 15 Final  . Magnesium 12/04/2014 2.0  1.5 - 2.5 mg/dL Final  Appointment on 12/04/2014  Component Date Value Ref Range Status  . WBC 12/04/2014 5.1  3.9 - 10.3 10e3/uL Final  . NEUT# 12/04/2014 3.5  1.5 - 6.5 10e3/uL Final  . HGB 12/04/2014 8.9* 11.6 - 15.9 g/dL Final  . HCT 12/04/2014 25.7* 34.8 - 46.6 % Final  . Platelets 12/04/2014 199  145 - 400 10e3/uL Final  . MCV 12/04/2014 98.8  79.5 - 101.0 fL Final  . MCH 12/04/2014 34.2* 25.1 - 34.0 pg Final  . MCHC 12/04/2014 34.6  31.5 - 36.0 g/dL Final  . RBC 12/04/2014 2.60* 3.70 - 5.45 10e6/uL  Final  . RDW 12/04/2014 17.9* 11.2 - 14.5 % Final  . lymph# 12/04/2014 0.6* 0.9 - 3.3 10e3/uL Final  . MONO# 12/04/2014 0.9  0.1 - 0.9 10e3/uL Final  . Eosinophils Absolute 12/04/2014 0.0  0.0 - 0.5 10e3/uL Final  . Basophils Absolute 12/04/2014 0.1  0.0 - 0.1 10e3/uL Final  . NEUT% 12/04/2014 68.8  38.4 - 76.8 % Final  . LYMPH% 12/04/2014 11.5* 14.0 - 49.7 % Final  . MONO% 12/04/2014 17.9* 0.0 - 14.0 % Final  . EOS% 12/04/2014 0.0  0.0 - 7.0 % Final  . BASO% 12/04/2014 1.8  0.0 - 2.0 % Final  . nRBC 12/04/2014 8* 0 - 0 % Final     RADIOGRAPHIC STUDIES: No results found.  ASSESSMENT/PLAN:    Dehydration Patient with continued, chronic nausea/vomiting; and subsequent dehydration.  Patient received approximately 1500 ML's normal saline IV fluid rehydration while at the cancer Center today.  However, patient continues to feel fairly dehydrated.  Patient will be admitted to the hospital as a direct admission this afternoon for further evaluation and management.  Brief history and report was given to hospitalist Dr. Carles Collet prior to patient being in transported to the floor is a direct admit via wheelchair per the [facility].   Nausea with vomiting Patient with continued, chronic nausea/vomiting; and subsequent dehydration.  Patient received approximately 1500 ML's normal saline IV fluid rehydration while at the cancer Center today.  She was given Zofran 8 mg IV as well.  She was able to drink some clear liquids with no  further vomiting.  However, patient continues to feel fairly dehydrated.  Patient will be admitted to the hospital as a direct admission this afternoon for further evaluation and management.  Brief history and report was given to hospitalist Dr. Carles Collet prior to patient being in transported to the floor is a direct admit via wheelchair per the [facility].  Also, did prescribe both Compazine and dexamethasone for patient to try at home for her chronic nausea.  She was  also given a refill of her Zofran per her request.  Did advise patient that taking dexamethasone would most likely increase her blood sugar even further; and that she would need to keep a very close eye on her blood sugars.   Anorexia Patient is complaining of chronic nausea/vomiting; and reports having minimal appetite.  She has had very poor oral intake.  Patient was encouraged to take her nausea medicines as previously directed; and to eat multiple small meals as possible throughout the day.   Hyperglycemia Patient's most recent blood sugars have been trending in the mid 200 range recently.  Patient has no history of diabetes in the past.  Today patient's blood sugar was 422.  Patient was given 20 units regular insulin subcutaneously today; and blood sugar rechecked was only down to 311.  Advised patient that she would need diabetic education and instruction regarding the initiation of insulin.  Call patient's primary care provider Dr. Delfina Redwood; and was advised that he was not in the office this week.  Spoke with Dr. Lina Sar NP; and was advised that patient would be better suited as a direct admission at least overnight for management of nausea/vomiting/dehydration; as well as apparent new onset diabetes.  Patient will be direct admitted to the hospitalist program under Dr. Carles Collet.  Brief history report were called to the hospitalist prior to patient being transported to the floor via wheelchair per the [facility].     Renal insufficiency Creatinine increased to 1.27 today.  Mostly, this is secondary to patient's dehydration.  Hopefully, creatinine will improve with continued rehydration.  We'll continue to monitor closely.   Hyperbilirubinemia Bilirubin has increased from 0.47 up to 1.3.  Patient denies any abdominal discomfort.  Most likely, hyperbilirubinemia is secondary to chemotherapy.  We'll continue to monitor closely.   Antineoplastic chemotherapy induced  anemia Chemotherapy-induced anemia with hemoglobin down from 10.8-8.9.  Patient is complaining of increased fatigue and some mild shortness of breath with exertion.  Will continue to monitor hemoglobin closely; the case patient does need a transfusion.   Skin lesion Patient complaint of multiple lesions to bilateral axilla regions for the past several weeks.  She states the lesions to the right axilla region are somewhat tender.  On exam-it does appear the patient has 3 different palpable lesions to her right axilla region.  These lesions are small; with the largest lesion approximately 1 cm in diameter.  There is some erythema and mild tenderness; but no warmth, no edema, and no red streaks.  The left axilla region with 1 pea-sized lesion palpated; but no obvious visible lesion noted.  Advised patient to apply warm compresses to sites; and to apply antibiotic cream to them as well.  Advised patient to let us know if these lesions persist or worsen whatsoever.  Low threshold in initiating antibiotics for these lesions.   Breast cancer of upper-inner quadrant of left female breast Patient received her third cycle of dose dense AC chemotherapy on 11/21/2014.  She was scheduled to receive her  fourth and final cycle of dose dense AC chemotherapy tomorrow 12/05/2014; but chemotherapy has been placed on hold while patient recovers from severe dehydration and hyperglycemia.  Will hold chemotherapy that was initially planned for tomorrow 12/05/2014.  Will reschedule labs, follow up visit, and chemotherapy for next week on 12/12/2014.   Patient stated understanding of all instructions; and was in agreement with this plan of care. The patient knows to call the clinic with any problems, questions or concerns.   This was a shared visit with Dr. Burr Medico today.   Total time spent with patient was 40 minutes;  with greater than 75 percent of that time spent in face to face counseling regarding patient's  symptoms,  and coordination of care and follow up.  Disclaimer: This note was dictated with voice recognition software. Similar sounding words can inadvertently be transcribed and may not be corrected upon review.   Drue Second, NP 12/04/2014   I have seen the patient, examined her. I agree with the assessment and and plan and have edited the notes.  She is experiencing more side effects form chemotherapy, some symptoms mainly related to her underlying anxiety also. Giving the multiple issues, especially nausea vomiting, dehydration, poor by mouth intake, newly diagnosed diabetes with hyperglycemia and AKI, I recommended hospital admission. I will follow her in the hospital.   Truitt Merle  12/04/2014

## 2014-12-04 NOTE — Progress Notes (Signed)
Patient arrived to floor at around 1620. Alert and oriented to place,time,situation,self. Denies pain and nausea.Placed comfortably in bed.RN to continue to monitor.

## 2014-12-04 NOTE — Assessment & Plan Note (Signed)
Patient's most recent blood sugars have been trending in the mid 200 range recently.  Patient has no history of diabetes in the past.  Today patient's blood sugar was 422.  Patient was given 20 units regular insulin subcutaneously today; and blood sugar rechecked was only down to 311.  Advised patient that she would need diabetic education and instruction regarding the initiation of insulin.  Call patient's primary care provider Dr. Delfina Redwood; and was advised that he was not in the office this week.  Spoke with Dr. Lina Sar NP; and was advised that patient would be better suited as a direct admission at least overnight for management of nausea/vomiting/dehydration; as well as apparent new onset diabetes.  Patient will be direct admitted to the hospitalist program under Dr. Carles Collet.  Brief history report were called to the hospitalist prior to patient being transported to the floor via wheelchair per the [facility].

## 2014-12-04 NOTE — Assessment & Plan Note (Signed)
Patient with continued, chronic nausea/vomiting; and subsequent dehydration.  Patient received approximately 1500 ML's normal saline IV fluid rehydration while at the cancer Center today.  However, patient continues to feel fairly dehydrated.  Patient will be admitted to the hospital as a direct admission this afternoon for further evaluation and management.  Brief history and report was given to hospitalist Dr. Carles Collet prior to patient being in transported to the floor is a direct admit via wheelchair per the [facility].

## 2014-12-04 NOTE — Assessment & Plan Note (Signed)
Chemotherapy-induced anemia with hemoglobin down from 10.8-8.9.  Patient is complaining of increased fatigue and some mild shortness of breath with exertion.  Will continue to monitor hemoglobin closely; the case patient does need a transfusion.

## 2014-12-04 NOTE — Assessment & Plan Note (Signed)
Bilirubin has increased from 0.47 up to 1.3.  Patient denies any abdominal discomfort.  Most likely, hyperbilirubinemia is secondary to chemotherapy.  We'll continue to monitor closely.

## 2014-12-04 NOTE — Assessment & Plan Note (Signed)
Patient complaint of multiple lesions to bilateral axilla regions for the past several weeks.  She states the lesions to the right axilla region are somewhat tender.  On exam-it does appear the patient has 3 different palpable lesions to her right axilla region.  These lesions are small; with the largest lesion approximately 1 cm in diameter.  There is some erythema and mild tenderness; but no warmth, no edema, and no red streaks.  The left axilla region with 1 pea-sized lesion palpated; but no obvious visible lesion noted.  Advised patient to apply warm compresses to sites; and to apply antibiotic cream to them as well.  Advised patient to let us know if these lesions persist or worsen whatsoever.  Low threshold in initiating antibiotics for these lesions.

## 2014-12-04 NOTE — Assessment & Plan Note (Signed)
Patient received her third cycle of dose dense AC chemotherapy on 11/21/2014.  She was scheduled to receive her fourth and final cycle of dose dense AC chemotherapy tomorrow 12/05/2014; but chemotherapy has been placed on hold while patient recovers from severe dehydration and hyperglycemia.  Will hold chemotherapy that was initially planned for tomorrow 12/05/2014.  Will reschedule labs, follow up visit, and chemotherapy for next week on 12/12/2014.

## 2014-12-04 NOTE — Assessment & Plan Note (Signed)
Patient is complaining of chronic nausea/vomiting; and reports having minimal appetite.  She has had very poor oral intake.  Patient was encouraged to take her nausea medicines as previously directed; and to eat multiple small meals as possible throughout the day.

## 2014-12-04 NOTE — H&P (Signed)
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 The patient last received chemotherapy on 11/21/2014. She was due to receive chemotherapy, her fourth cycle on 12/05/2014. Unfortunately, the patient had at least 3-4 episodes of emesis on 12/03/2014 without any coffee grounds or bright red blood. The patient denies any new medications or NSAIDs. She denies any fevers, chills, chest pain, shortness of breath, coughing, hemoptysis, abdominal pain, dysuria, hematuria, diarrhea, hematochezia, melena. The patient felt fatigued and dehydrated. She came to [facility] on 12/04/2014 to be evaluated, and the patient was directly admitted from the cancer center. Blood work revealed sodium 129 with serum creatinine 1.27. Hepatic panel showed AST 36, ALT 28, alkaline phosphatase was 92, total bilirubin 1.3. WBC was 5.1 with hemoglobin 8.9 and platelets 199,000. Assessment/Plan: Dehydration/recurrent vomiting -Secondary to vomiting which may be related to the patient's chemotherapy -IV fluids -Antiemetics -Urinalysis -Hold HCTZ -Start clear liquid diet and advance as tolerated Hyperglycemia -No previous history of diabetes mellitus -Question chemotherapy-induced -Hemoglobin A1c -NovoLog sliding scale -Please note the patient received 20 units of NovoLog at the cancer center approximately @1325  Hyponatremia -in part due to hyperglycemia as well as volume depletion -tx with insulin and IVF AKI -due to hypovolemia -IVF -baseline creatinine 0.7-0.9 Hypertension -Continue metoprolol  -Hold HCTZ for now Anemia -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 Breast cancer of upper-inner quadrant of left female breast 10/01/2014  . Hypertension   . Family history of breast cancer   . Wears glasses     driving  . Diabetes mellitus without complication 09/23/52    chemo caused diabetes per pt    Past Surgical History  Procedure Laterality Date  . Tonsillectomy    . Wisdom tooth extraction    . Portacath placement Right 10/21/2014    Procedure: INSERTION PORT-A-CATH;  Surgeon: Erroll Luna, MD;  Location: [place];  Service: General;  Laterality: Right;   Social History:  reports that she has never smoked. She has never used smokeless tobacco. She reports that she drinks about 0.6 oz of alcohol per week. She reports that she does not use illicit drugs.   Family History  Problem Relation Age of Onset  . Prostate cancer Father 62    currently 55  . Breast cancer Paternal Aunt 61    deceased 36  . Lung cancer Maternal Grandfather 61    smoker; deceased  . Thyroid cancer Cousin 25    pat first cousin; currently 11     Allergies  Allergen Reactions  . Tegaderm Ag Mesh [Silver] Dermatitis    tegaderm film      Prior to Admission medications   Medication Sig Start Date End Date Taking? Authorizing Provider  ALPRAZolam Duanne Moron) 1 MG tablet Take 1-2 mg by mouth 2 (two) times daily. Take 1 tablet (1 mg) in the morning & Take 2 tablets (2 mg) in the evening.    Historical Provider, MD  Alum & Mag Hydroxide-Simeth (MAGIC MOUTHWASH W/LIDOCAINE) SOLN Take  5 mLs by mouth 4 (four) times daily as needed for mouth pain. 11/04/14   Truitt Merle, MD  aspirin 81 MG tablet Take 81 mg by mouth daily.    Historical Provider, MD  atorvastatin (LIPITOR) 10 MG tablet Take 10 mg by mouth daily.    Historical Provider,  MD  calcium carbonate (OS-CAL) 600 MG TABS tablet Take 600 mg by mouth daily with breakfast.    Historical Provider, MD  dexamethasone (DECADRON) 4 MG tablet 1/2 tablet (2 mg) Q AM for nausea PRN. 12/04/14   Susanne Borders, NP  gabapentin (NEURONTIN) 100 MG capsule Take 100 mg by mouth at bedtime.     Historical Provider, MD  hydrochlorothiazide (HYDRODIURIL) 25 MG tablet Take 25 mg by mouth daily.     Historical Provider, MD  lidocaine-prilocaine (EMLA) cream Apply to affected area once 10/20/14   Truitt Merle, MD  LORazepam (ATIVAN) 1 MG tablet Take 1 tablet (1 mg total) by mouth at bedtime as needed for sleep. 11/20/14   Carlton Adam, PA-C  menthol-cetylpyridinium (CEPACOL) 3 MG lozenge Take 1 lozenge by mouth every 2 (two) hours as needed for sore throat (sore throat).    Historical Provider, MD  metoprolol (LOPRESSOR) 100 MG tablet Take 100 mg by mouth daily.    Historical Provider, MD  ondansetron (ZOFRAN) 8 MG tablet Take 1 tablet (8 mg total) by mouth every 8 (eight) hours as needed for nausea. Start on the third day after chemotherapy. 12/04/14   Susanne Borders, NP  oxyCODONE-acetaminophen (ROXICET) 5-325 MG per tablet Take 1-2 tablets by mouth every 4 (four) hours as needed. 10/21/14   Erroll Luna, MD  prochlorperazine (COMPAZINE) 10 MG tablet Take 1 tablet (10 mg total) by mouth every 6 (six) hours as needed for nausea or vomiting. 12/04/14   Susanne Borders, NP  venlafaxine XR (EFFEXOR-XR) 150 MG 24 hr capsule Take 150 mg by mouth daily with breakfast.    Historical Provider, MD    Review of Systems:  Constitutional:  No weight loss, night sweats, Fevers, chills  Head&Eyes: No headache.  No vision loss.  No eye pain or scotoma ENT:  No Difficulty swallowing,Tooth/dental problems,Sore throat,   Cardio-vascular:  No chest pain, Orthopnea, PND, swelling in lower extremities,  dizziness, palpitations  GI:  No  abdominal pain,  diarrhea, loss of appetite, hematochezia, melena,  heartburn, indigestion, Resp:  No shortness of breath with exertion or at rest. No cough. No coughing up of blood .No wheezing.No chest wall deformity  Skin:  no rash or lesions.  GU:  no dysuria, change in color of urine, no urgency or frequency. No flank pain.  Musculoskeletal:  No joint pain or swelling. No decreased range of motion. No back pain.  Psych:  No change in mood or affect. Neurologic: No headache, no dysesthesia, no focal weakness, no vision loss. No syncope  Physical Exam: Filed Vitals:   12/04/14 1639 12/04/14 1658  BP: 153/84   Pulse: 96   Temp: 98.6 F (37 C)   Resp: 19   Height:  5\' 5"  (1.651 m)  Weight:  101.152 kg (223 lb)  SpO2: 99%    General:  A&O x 3, NAD, nontoxic, pleasant/cooperative Head/Eye: No conjunctival hemorrhage, no icterus, [place]/AT, No nystagmus ENT:  No icterus,  No thrush, good dentition, no pharyngeal exudate Neck:  No masses, no lymphadenpathy, no bruits CV:  RRR, no rub, no gallop, no S3 Lung:  CTAB, good air movement, no wheeze, no  rhonchi Abdomen: soft/NT, +BS, nondistended, no peritoneal signs Ext: No cyanosis, No rashes, No petechiae, No lymphangitis, No edema  Labs on Admission:  Basic Metabolic Panel:  Recent Labs Lab 12/04/14 1134  NA 129*  K 3.6  CL 91*  CO2 25  GLUCOSE 422*  BUN 10  CREATININE 1.27*  CALCIUM 8.8  MG 2.0   Liver Function Tests:  Recent Labs Lab 12/04/14 1134  AST 36  ALT 28  ALKPHOS 92  BILITOT 1.3*  PROT 7.5  ALBUMIN 4.5   No results for input(s): LIPASE, AMYLASE in the last 168 hours. No results for input(s): AMMONIA in the last 168 hours. CBC:  Recent Labs Lab 12/04/14 1110  WBC 5.1  NEUTROABS 3.5  HGB 8.9*  HCT 25.7*  MCV 98.8  PLT 199   Cardiac Enzymes: No results for input(s): CKTOTAL, CKMB, CKMBINDEX, TROPONINI in the last 168 hours. BNP: Invalid input(s): POCBNP CBG: No results for input(s): GLUCAP in the last 168 hours.  Radiological Exams on Admission: No  results found.      Time spent:60 minutes Code Status:   FULL Family Communication:   Family at bedside   Lakechia Nay, DO  [facility] Pager [phone]  If 7PM-7AM, please contact night-coverage [email or web address] Password [facility] 12/04/2014, 5:26 PM

## 2014-12-04 NOTE — Assessment & Plan Note (Addendum)
Patient with continued, chronic nausea/vomiting; and subsequent dehydration.  Patient received approximately 1500 ML's normal saline IV fluid rehydration while at the cancer Center today.  She was given Zofran 8 mg IV as well.  She was able to drink some clear liquids with no further vomiting.  However, patient continues to feel fairly dehydrated.  Patient will be admitted to the hospital as a direct admission this afternoon for further evaluation and management.  Brief history and report was given to hospitalist Dr. Carles Collet prior to patient being in transported to the floor is a direct admit via wheelchair per the [place].  Also, did prescribe both Compazine and dexamethasone for patient to try at home for her chronic nausea.  She was also given a refill of her Zofran per her request.  Did advise patient that taking dexamethasone would most likely increase her blood sugar even further; and that she would need to keep a very close eye on her blood sugars.

## 2014-12-04 NOTE — Telephone Encounter (Signed)
TC from patient stating that she is continuing to feel weak and dehydrated. She is unable to keep any fluids or food down.  She cannot stand up very long as she feels so weak. Has Zofran at home but it is not helping. She is out of her phenergan suppositories.  She is scheduled for chemo tomorrow 12/05/14. When asked, pt. states she feels she needs to be seen today and is agreeable to labs and IV fluids. Scheduled pt for labs (CBC and CMET) and appt in [facility] with Selena Lesser, NP  Pt. Has a port and desires labs drawn from there.

## 2014-12-04 NOTE — Assessment & Plan Note (Signed)
Creatinine increased to 1.27 today.  Mostly, this is secondary to patient's dehydration.  Hopefully, creatinine will improve with continued rehydration.  We'll continue to monitor closely.

## 2014-12-05 ENCOUNTER — Ambulatory Visit: Payer: 59

## 2014-12-05 ENCOUNTER — Telehealth: Payer: Self-pay | Admitting: *Deleted

## 2014-12-05 ENCOUNTER — Other Ambulatory Visit: Payer: 59

## 2014-12-05 ENCOUNTER — Ambulatory Visit: Payer: 59 | Admitting: Hematology

## 2014-12-05 ENCOUNTER — Telehealth: Payer: Self-pay | Admitting: Hematology

## 2014-12-05 DIAGNOSIS — R112 Nausea with vomiting, unspecified: Secondary | ICD-10-CM

## 2014-12-05 DIAGNOSIS — T451X5A Adverse effect of antineoplastic and immunosuppressive drugs, initial encounter: Secondary | ICD-10-CM

## 2014-12-05 DIAGNOSIS — D6481 Anemia due to antineoplastic chemotherapy: Principal | ICD-10-CM

## 2014-12-05 DIAGNOSIS — E860 Dehydration: Secondary | ICD-10-CM

## 2014-12-05 LAB — COMPREHENSIVE METABOLIC PANEL
ALK PHOS: 62 U/L (ref 39–117)
ALT: 22 U/L (ref 0–35)
AST: 28 U/L (ref 0–37)
Albumin: 3.3 g/dL — ABNORMAL LOW (ref 3.5–5.2)
Anion gap: 8 (ref 5–15)
BUN: 7 mg/dL (ref 6–23)
CALCIUM: 8.1 mg/dL — AB (ref 8.4–10.5)
CO2: 27 mmol/L (ref 19–32)
Chloride: 103 mmol/L (ref 96–112)
Creatinine, Ser: 1.03 mg/dL (ref 0.50–1.10)
GFR calc non Af Amer: 61 mL/min — ABNORMAL LOW (ref >90)
GFR, EST AFRICAN AMERICAN: 71 mL/min — AB (ref >90)
Glucose, Bld: 227 mg/dL — ABNORMAL HIGH (ref 70–99)
Potassium: 3.3 mmol/L — ABNORMAL LOW (ref 3.5–5.1)
SODIUM: 138 mmol/L (ref 135–145)
Total Bilirubin: 0.8 mg/dL (ref 0.3–1.2)
Total Protein: 5.9 g/dL — ABNORMAL LOW (ref 6.0–8.3)

## 2014-12-05 LAB — GLUCOSE, CAPILLARY
GLUCOSE-CAPILLARY: 237 mg/dL — AB (ref 70–99)
GLUCOSE-CAPILLARY: 180 mg/dL — AB (ref 70–99)
Glucose-Capillary: 224 mg/dL — ABNORMAL HIGH (ref 70–99)
Glucose-Capillary: 182 mg/dL — ABNORMAL HIGH (ref 70–99)

## 2014-12-05 LAB — VITAMIN B12: VITAMIN B 12: 735 pg/mL (ref 211–911)

## 2014-12-05 LAB — CBC
HCT: 21.8 % — ABNORMAL LOW (ref 36.0–46.0)
HEMOGLOBIN: 7.4 g/dL — AB (ref 12.0–15.0)
MCH: 35.1 pg — ABNORMAL HIGH (ref 26.0–34.0)
MCHC: 33.9 g/dL (ref 30.0–36.0)
MCV: 103.3 fL — ABNORMAL HIGH (ref 78.0–100.0)
Platelets: 153 10*3/uL (ref 150–400)
RBC: 2.11 MIL/uL — ABNORMAL LOW (ref 3.87–5.11)
RDW: 18.8 % — ABNORMAL HIGH (ref 11.5–15.5)
WBC: 3.8 10*3/uL — ABNORMAL LOW (ref 4.0–10.5)

## 2014-12-05 LAB — FERRITIN: FERRITIN: 1332 ng/mL — AB (ref 10–291)

## 2014-12-05 LAB — PREPARE RBC (CROSSMATCH)

## 2014-12-05 LAB — IRON AND TIBC
IRON: 75 ug/dL (ref 42–145)
Saturation Ratios: 24 % (ref 20–55)
TIBC: 314 ug/dL (ref 250–470)
UIBC: 239 ug/dL (ref 125–400)

## 2014-12-05 LAB — ABO/RH: ABO/RH(D): O NEG

## 2014-12-05 MED ORDER — PROCHLORPERAZINE MALEATE 10 MG PO TABS
10.0000 mg | ORAL_TABLET | Freq: Four times a day (QID) | ORAL | Status: DC | PRN
  Administered 2014-12-05: 10 mg via ORAL
  Filled 2014-12-05: qty 1

## 2014-12-05 MED ORDER — ALPRAZOLAM 1 MG PO TABS
1.0000 mg | ORAL_TABLET | Freq: Four times a day (QID) | ORAL | Status: DC | PRN
  Administered 2014-12-05 – 2014-12-06 (×4): 1 mg via ORAL
  Filled 2014-12-05: qty 1
  Filled 2014-12-05: qty 2
  Filled 2014-12-05: qty 1

## 2014-12-05 MED ORDER — POTASSIUM CHLORIDE CRYS ER 20 MEQ PO TBCR
60.0000 meq | EXTENDED_RELEASE_TABLET | Freq: Once | ORAL | Status: AC
  Administered 2014-12-05: 60 meq via ORAL
  Filled 2014-12-05: qty 3

## 2014-12-05 MED ORDER — INSULIN STARTER KIT- SYRINGES (ENGLISH)
1.0000 | Freq: Once | Status: AC
  Administered 2014-12-05: 1
  Filled 2014-12-05: qty 1

## 2014-12-05 MED ORDER — LIVING WELL WITH DIABETES BOOK
Freq: Once | Status: AC
  Administered 2014-12-05: 13:00:00
  Filled 2014-12-05: qty 1

## 2014-12-05 MED ORDER — SODIUM CHLORIDE 0.9 % IV SOLN: Freq: Once | INTRAVENOUS | Status: DC

## 2014-12-05 NOTE — Plan of Care (Signed)
Problem: Food- and Nutrition-Related Knowledge Deficit (NB-1.1) Goal: Nutrition education Formal process to instruct or train a patient/client in a skill or to impart knowledge to help patients/clients voluntarily manage or modify food choices and eating behavior to maintain or improve health. Outcome: Completed/Met Date Met:  12/05/14  RD consulted for nutrition education regarding diabetes.   No results found for: HGBA1C  RD provided "Carbohydrate Counting for People with Diabetes" handout from the Academy of Nutrition and Dietetics. Discussed different food groups and their effects on blood sugar, emphasizing carbohydrate-containing foods. Provided list of carbohydrates and recommended serving sizes of common foods.  Discussed importance of controlled and consistent carbohydrate intake throughout the day. Provided examples of ways to balance meals/snacks and encouraged intake of high-fiber, whole grain complex carbohydrates. Teach back method used.  Expect good compliance.  Body mass index is 37.09 kg/(m^2). Pt meets criteria for obesity based on current BMI.  Current diet order is carbohydrate modified, patient is consuming approximately 50% of meals at this time. Labs and medications reviewed. No further nutrition interventions warranted at this time. RD contact information provided. If additional nutrition issues arise, please re-consult RD.  Laurette Schimke [place], [place], [place]

## 2014-12-05 NOTE — Progress Notes (Signed)
CARE MANAGEMENT NOTE 12/05/2014  Patient:  Ashlee Hill, Ashlee Hill   Account Number:  [id number]  Date Initiated:  12/05/2014  Documentation initiated by:  Edwyna Shell  Subjective/Objective Assessment:   54 yo female admitted with dehydration due to vomiting     Action/Plan:   discharge planning   Anticipated DC Date:  12/07/2014   Anticipated DC Plan:  [place]  CM consult      Choice offered to / List presented to:             Status of service:  In process, will continue to follow Medicare Important Message given?   (If response is "NO", the following Medicare IM given date fields will be blank) Date Medicare IM given:   Medicare IM given by:   Date Additional Medicare IM given:   Additional Medicare IM given by:    Discharge Disposition:  HOME/SELF CARE  Per UR Regulation:  Reviewed for med. necessity/level of care/duration of stay  If discussed at [place] of Stay Meetings, dates discussed:    Comments:  12/05/14 Edwyna Shell RN BSN CM [phone] Patient confirmed her [facility] insurance. Discussed oral DM medications and the cost at [facility]. Then discussed cost of insulins with patient and provided a coupon card for [employer] products such as NovoLog and latus. Explained that with her commercial insurance and the coupon card each insulin will cost at most $25 a month. The patient then stated that she has met her deductible and has not been paying anything for her medications. This CM explained that she most likely will not have any copay on any discharge medications but to keep the coupon card if needed. Patient verbalized understanding and answered her questions.

## 2014-12-05 NOTE — Telephone Encounter (Signed)
Per staff message and POF I have scheduled appts. Advised scheduler of appts. JMW  

## 2014-12-05 NOTE — Telephone Encounter (Signed)
Left message to confirm appointment for 04/22.

## 2014-12-05 NOTE — Progress Notes (Signed)
[facility] PROGRESS NOTE   Ashlee Hill BJY:[id number] DOB: 04-09-1961 DOA: 12/04/2014 PCP: Kandice Hams, MD  HPI/Subjective: Feels okay, complains about anxiety, and Xanax restarted.  Assessment/Plan: Active Problems:   Breast cancer of upper-inner quadrant of left female breast   Dehydration   Nausea with vomiting   Hyperglycemia   Hyperbilirubinemia   Antineoplastic chemotherapy induced anemia   Dehydration/recurrent vomiting -Secondary to vomiting which may be related to the patient's chemotherapy -Dehydration, presented with acute kidney injury with creatinine of 1.27. -Started on IV fluids, improved creatinine to 1.0, continue IV fluids for today.  Hyperglycemia -Random blood sugar is 422, this is diabetes till proven otherwise. -Question chemotherapy-induced -Check hemoglobin A1c, start NovoLog sliding scale and carbohydrate modified diet. -Depending on the A1c value patient will be either on oral hypoglycemics or insulin.  Hyponatremia -Pseudohyponatremia secondary to hyperglycemia. -Corrected sodium to glucose is 137.  AKI -Presented with creatinine of 1.27, this is secondary to dehydration and hypovolemia. -Improved with IV fluid hydration, creatinine today is 1.0.  Hypertension -Continue metoprolol  -Hold HCTZ for now  Anemia -Hemoglobin was 8.9 yesterday, worsened down to 7.4 after fluid hydration. -I will transfuse 1 unit of packed RBCs and check hemoglobin in the morning.  Anxiety/depression -Resume Xanax and Effexor home doses  Hyperbilirubinemia -?Gilbert's -fractionate bili -recheck LFTs in am  Code Status: Full Code Family Communication: Plan discussed with the patient. Disposition Plan: Remains inpatient Diet: Diet Carb Modified Fluid consistency:: Thin; Room service appropriate?: Yes  Consultants:  None  Procedures:  None  Antibiotics:  None   Objective: Filed Vitals:   12/05/14 0528  BP: 133/77  Pulse:  88  Temp: 98.4 F (36.9 C)  Resp: 16    Intake/Output Summary (Last 24 hours) at 12/05/14 1204 Last data filed at 12/05/14 0851  Gross per 24 hour  Intake    790 ml  Output    550 ml  Net    240 ml   Filed Weights   12/04/14 1658 12/05/14 0528  Weight: 101.152 kg (223 lb) 101.107 kg (222 lb 14.4 oz)    Exam: General: Alert and awake, oriented x3, not in any acute distress. HEENT: anicteric sclera, pupils reactive to light and accommodation, EOMI CVS: S1-S2 clear, no murmur rubs or gallops Chest: clear to auscultation bilaterally, no wheezing, rales or rhonchi Abdomen: soft nontender, nondistended, normal bowel sounds, no organomegaly Extremities: no cyanosis, clubbing or edema noted bilaterally Neuro: Cranial nerves II-XII intact, no focal neurological deficits  Data Reviewed: Basic Metabolic Panel:  Recent Labs Lab 12/04/14 1134 12/05/14 0520  NA 129* 138  K 3.6 3.3*  CL 91* 103  CO2 25 27  GLUCOSE 422* 227*  BUN 10 7  CREATININE 1.27* 1.03  CALCIUM 8.8 8.1*  MG 2.0  --    Liver Function Tests:  Recent Labs Lab 12/04/14 1134 12/04/14 1820 12/05/14 0520  AST 36  --  28  ALT 28  --  22  ALKPHOS 92  --  62  BILITOT 1.3* 0.9 0.8  PROT 7.5  --  5.9*  ALBUMIN 4.5  --  3.3*   No results for input(s): LIPASE, AMYLASE in the last 168 hours. No results for input(s): AMMONIA in the last 168 hours. CBC:  Recent Labs Lab 12/04/14 1110 12/05/14 0520  WBC 5.1 3.8*  NEUTROABS 3.5  --   HGB 8.9* 7.4*  HCT 25.7* 21.8*  MCV 98.8 103.3*  PLT 199 153   Cardiac Enzymes: No results for input(s):  CKTOTAL, CKMB, CKMBINDEX, TROPONINI in the last 168 hours. BNP (last 3 results) No results for input(s): BNP in the last 8760 hours.  ProBNP (last 3 results) No results for input(s): PROBNP in the last 8760 hours.  CBG:  Recent Labs Lab 12/04/14 1809 12/04/14 2234 12/05/14 0815 12/05/14 1153  GLUCAP 156* 190* 237* 224*    Micro No results found for this  or any previous visit (from the past 240 hour(s)).   Studies: No results found.  Scheduled Meds: . ALPRAZolam  1 mg Oral q morning - 10a  . ALPRAZolam  2 mg Oral QPM  . aspirin EC  81 mg Oral Daily  . atorvastatin  10 mg Oral Daily  . enoxaparin (LOVENOX) injection  40 mg Subcutaneous Q24H  . gabapentin  100 mg Oral QHS  . insulin aspart  0-15 Units Subcutaneous TID WC  . metoprolol  100 mg Oral Daily  . venlafaxine XR  150 mg Oral Q breakfast   Continuous Infusions: . sodium chloride 0.9 % 1,000 mL with potassium chloride 20 mEq infusion 100 mL/hr at 12/05/14 0515       Time spent: 35 minutes    [facility] A  [facility] Pager [phone] If 7PM-7AM, please contact night-coverage at [email or web address], password [facility] 12/05/2014, 12:04 PM  LOS: 1 day

## 2014-12-05 NOTE — Progress Notes (Signed)
Inpatient Diabetes Program Recommendations  AACE/ADA: New Consensus Statement on Inpatient Glycemic Control (2013)  Target Ranges:  Prepandial:   less than 140 mg/dL      Peak postprandial:   less than 180 mg/dL (1-2 hours)      Critically ill patients:  140 - 180 mg/dL     Results for Ashlee Hill, Ashlee Hill (MRN [id number]) as of 12/05/2014 12:21  Ref. Range 12/04/2014 11:34 12/04/2014 13:57 12/05/2014 05:20  Glucose Latest Ref Range: 70-99 mg/dL 422 (H) 311 (H) 227 (H)     Admit with: Vomiting/ Dehydration/ Hyperglycemia  History: HTN, Stage 2 Invasive breast Cancer  Current DM Orders: Novolog Moderate SSI tid ac + HS     **Note patient admitted with new diagnosis of DM.  **A1c pending.  **Spoke with pt about new diagnosis.  Discussed A1C results with patient and explained what an A1C is, basic pathophysiology of DM Type 2, basic home care, basic diabetes diet nutrition principles, importance of checking CBGs and maintaining good CBG control to prevent long-term and short-term complications.  Reviewed signs and symptoms of hyperglycemia and hypoglycemia and how to treat both at home.  Also reviewed blood sugar goals at home and when to check CBGs.   **Patient able to check CBG with the hospital meter with little prompting.  Asked NT to please allow pt to continue practicing CBG checks as much as possible before d/c.  **RNs to provide ongoing basic DM education at bedside with this patient.  Have ordered educational booklet, insulin starter kit, and DM videos.    **Have also placed RD consult for DM diet education for this patient.  **Patient very anxious and overwhelmed by this new diagnosis of DM.  Attempted to redirect patient to taking her DM education one step at a time.  Encouraged patient to follow-up with her PCP, Dr.Polite soon after d/c to discuss her new diagnosis of DM with Dr. Delfina Redwood.  Ordered Outpatient DM Education for pt to attend after d/c at the [place]  and DM Management center.     MD- Please keep cost considerations in mind when discharging patient home on DM medications.  Patient does have [employer] but is not sure how good her Rx coverage is.    Will follow [name] RN, MSN, CDE Diabetes Coordinator Inpatient Diabetes Program Team Pager: [phone] (8a-5p)

## 2014-12-06 ENCOUNTER — Ambulatory Visit: Payer: 59

## 2014-12-06 LAB — CBC
HCT: 23.1 % — ABNORMAL LOW (ref 36.0–46.0)
HEMOGLOBIN: 7.8 g/dL — AB (ref 12.0–15.0)
MCH: 34.2 pg — AB (ref 26.0–34.0)
MCHC: 33.8 g/dL (ref 30.0–36.0)
MCV: 101.3 fL — ABNORMAL HIGH (ref 78.0–100.0)
Platelets: 164 10*3/uL (ref 150–400)
RBC: 2.28 MIL/uL — ABNORMAL LOW (ref 3.87–5.11)
RDW: 18.9 % — ABNORMAL HIGH (ref 11.5–15.5)
WBC: 4.6 10*3/uL (ref 4.0–10.5)

## 2014-12-06 LAB — GLUCOSE, CAPILLARY: Glucose-Capillary: 221 mg/dL — ABNORMAL HIGH (ref 70–99)

## 2014-12-06 LAB — BASIC METABOLIC PANEL
Anion gap: 8 (ref 5–15)
BUN: <5 mg/dL — ABNORMAL LOW (ref 6–23)
CALCIUM: 7.7 mg/dL — AB (ref 8.4–10.5)
CO2: 24 mmol/L (ref 19–32)
Chloride: 106 mmol/L (ref 96–112)
Creatinine, Ser: 0.79 mg/dL (ref 0.50–1.10)
GFR calc non Af Amer: >90 mL/min (ref >90)
Glucose, Bld: 204 mg/dL — ABNORMAL HIGH (ref 70–99)
Potassium: 4.1 mmol/L (ref 3.5–5.1)
Sodium: 138 mmol/L (ref 135–145)

## 2014-12-06 LAB — HEMOGLOBIN A1C
HEMOGLOBIN A1C: 8.2 % — AB (ref 4.8–5.6)
Hgb A1c MFr Bld: 8 % — ABNORMAL HIGH (ref 4.8–5.6)
MEAN PLASMA GLUCOSE: 183 mg/dL
Mean Plasma Glucose: 189 mg/dL

## 2014-12-06 MED ORDER — HEPARIN SOD (PORK) LOCK FLUSH 100 UNIT/ML IV SOLN
500.0000 [IU] | INTRAVENOUS | Status: DC | PRN
  Filled 2014-12-06: qty 5

## 2014-12-06 MED ORDER — METFORMIN HCL 1000 MG PO TABS: 1000.0000 mg | ORAL_TABLET | Freq: Two times a day (BID) | ORAL | Status: DC

## 2014-12-06 NOTE — Discharge Summary (Signed)
Physician Discharge Summary  Ashlee Hill NFA:[id number] DOB: 22-Dec-1960 DOA: 12/04/2014  PCP: Kandice Hams, MD  Admit date: 12/04/2014 Discharge date: 12/06/2014  Time spent: 40 minutes  Recommendations for Outpatient Follow-up:  1. Follow-up with primary care physician within one week. 2. Follow-up with Dr. Burr Medico as outpatient in one week, follow CBC/hemoglobin.  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 The patient last received chemotherapy on 11/21/2014. She was due to receive chemotherapy, her fourth cycle on 12/05/2014. Unfortunately, the patient had at least 3-4 episodes of emesis on 12/03/2014 without any coffee grounds or bright red blood. The patient denies any new medications or NSAIDs. She denies any fevers, chills, chest pain, shortness of breath, coughing, hemoptysis, abdominal pain, dysuria, hematuria, diarrhea, hematochezia, melena. The patient felt fatigued and dehydrated. She came to [facility] on 12/04/2014 to be evaluated, and the patient was directly admitted from the cancer center. Blood work revealed sodium 129 with serum creatinine 1.27. Hepatic panel showed AST 36, ALT 28, alkaline phosphatase was 92, total bilirubin 1.3. WBC was 5.1 with hemoglobin 8.9 and platelets 199,000.  Hospital Course:   Dehydration/recurrent vomiting -Secondary to vomiting which may be related  to the patient's chemotherapy -Dehydration, presented with acute kidney injury with creatinine of 1.27. -Started on IV fluids, creatinine improved to 0.79 on discharge.  Diabetes mellitus type 2 -Random blood sugar is 422, hemoglobin A1c is 8.2 correlate with mean plasma glucose of 189. -Treated with sliding-scale the hospital on carbohydrate modified diet. -On discharge 1 g of metformin twice a day. -Instructed to check blood sugar in the morning and take log to PCP for further adjustment of her regimen.  Hyponatremia -Pseudohyponatremia secondary to hyperglycemia, presented with sodium of 122. -Corrected sodium to glucose is 137 to plasma glucose of 422.  AKI -Presented with creatinine of 1.27, this is secondary to dehydration and hypovolemia. -Improved with IV fluid hydration, creatinine on discharge is 0.7.  Hypertension -Continue metoprolol  -HCTZ held on admission, restarted on discharge, medication probably can be adjusted and either ACEI or ARB can added.  Anemia -Hemoglobin was 8.9 yesterday, worsened down to 7.4 after fluid hydration. -Status post transfusion of 1 unit of packed RBCs, hemoglobin improved only to 7.8. To follow with oncology as outpatient.  Anxiety/depression -Resume Xanax and Effexor home doses  Hyperbilirubinemia -With total bili of 1.3, improved with hydration to less than 1.   Procedures:  Transfusion of 1 unit of packed RBCs on 12/05/2014  Consultations:  None  Discharge Exam: Filed Vitals:   12/06/14 0615  BP: 140/74  Pulse: 93  Temp: 98.2 F (36.8 C)  Resp: 18   General: Alert and awake, oriented x3, not in any acute distress. HEENT: anicteric sclera, pupils reactive to light and accommodation, EOMI CVS: S1-S2 clear, no murmur rubs or gallops Chest: clear to auscultation bilaterally, no wheezing, rales or rhonchi Abdomen: soft nontender, nondistended, normal bowel sounds, no organomegaly Extremities:  no cyanosis, clubbing or  edema noted bilaterally Neuro: Cranial nerves II-XII intact, no focal neurological deficits  Discharge Instructions   Discharge Instructions    Ambulatory referral to Nutrition and Diabetic Education    Complete by:  As directed   New diagnosis of DM.  Awaiting A1c results.  Not sure if pt will d/c home on oral meds or insulin.  Please call patient early next week.  Thanks!     Diet Carb Modified    Complete by:  As directed      Increase activity slowly    Complete by:  As directed           Current Discharge Medication List    START taking these medications   Details  metFORMIN (GLUCOPHAGE) 1000 MG tablet Take 1 tablet (1,000 mg total) by mouth 2 (two) times daily with a meal. Qty: 60 tablet, Refills: 2      CONTINUE these medications which have NOT CHANGED   Details  ALPRAZolam (XANAX) 1 MG tablet Take 1-2 mg by mouth 2 (two) times daily. Take 1 tablet (1 mg) in the morning & Take 2 tablets (2 mg) in the evening.    Alum & Mag Hydroxide-Simeth (MAGIC MOUTHWASH W/LIDOCAINE) SOLN Take 5 mLs by mouth 4 (four) times daily as needed for mouth pain. Qty: 120 mL, Refills: 1   Associated Diagnoses: Mucositis    aspirin 81 MG tablet Take 81 mg by mouth daily.    atorvastatin (LIPITOR) 10 MG tablet Take 10 mg by mouth daily.    calcium carbonate (OS-CAL) 600 MG TABS tablet Take 600 mg by mouth daily with breakfast.    gabapentin (NEURONTIN) 600 MG tablet Take 600 mg by mouth at bedtime. As needed for sleep or anxiety    hydrochlorothiazide (HYDRODIURIL) 25 MG tablet Take 25 mg by mouth daily.     lidocaine-prilocaine (EMLA) cream Apply to affected area once Qty: 30 g, Refills: 3   Associated Diagnoses: Breast cancer of upper-inner quadrant of left female breast    LORazepam (ATIVAN) 1 MG tablet Take 1 tablet (1 mg total) by mouth at bedtime as needed for sleep. Qty: 30 tablet, Refills: 0   Associated Diagnoses: Breast cancer of upper-inner quadrant of left female breast     meclizine (ANTIVERT) 25 MG tablet Take 25 mg by mouth 3 (three) times daily as needed for dizziness.    menthol-cetylpyridinium (CEPACOL) 3 MG lozenge Take 1 lozenge by mouth every 2 (two) hours as needed for sore throat (sore throat).    metoprolol (LOPRESSOR) 100 MG tablet Take 100 mg by mouth daily.    ondansetron (ZOFRAN) 8 MG tablet Take 1 tablet (8 mg total) by mouth every 8 (eight) hours as needed for nausea. Start on the third day after chemotherapy. Qty: 30 tablet, Refills: 1   Associated Diagnoses: Breast cancer of upper-inner quadrant of left female breast    PRESCRIPTION MEDICATION Chemo [facility]    venlafaxine XR (EFFEXOR-XR) 150 MG 24 hr capsule Take 150 mg by mouth daily with breakfast.    dexamethasone (DECADRON) 4 MG tablet 1/2 tablet (2 mg) Q AM for nausea PRN. Qty: 10 tablet, Refills: 0    oxyCODONE-acetaminophen (ROXICET) 5-325 MG per tablet Take 1-2 tablets by mouth every 4 (four) hours as needed. Qty: 30 tablet, Refills: 0    prochlorperazine (COMPAZINE) 10 MG tablet Take 1 tablet (10 mg total) by mouth every 6 (six) hours as needed for nausea or vomiting. Qty: 30 tablet, Refills: 1  STOP taking these medications     gabapentin (NEURONTIN) 100 MG capsule        Allergies  Allergen Reactions  . Tegaderm Ag Mesh [Silver] Dermatitis    tegaderm film   Follow-up Information    Follow up with POLITE,RONALD D, MD In 1 week.   Specialty:  Internal Medicine   Contact information:   [address] [place] [address] [phone]        The results of significant diagnostics from this hospitalization (including imaging, microbiology, ancillary and laboratory) are listed below for reference.    Significant Diagnostic Studies: No results found.  Microbiology: No results found for this or any previous visit (from the past 240 hour(s)).   Labs: Basic Metabolic Panel:  Recent Labs Lab 12/04/14 1134 12/05/14 0520 12/06/14 0530   NA 129* 138 138  K 3.6 3.3* 4.1  CL 91* 103 106  CO2 25 27 24   GLUCOSE 422* 227* 204*  BUN 10 7 <5*  CREATININE 1.27* 1.03 0.79  CALCIUM 8.8 8.1* 7.7*  MG 2.0  --   --    Liver Function Tests:  Recent Labs Lab 12/04/14 1134 12/04/14 1820 12/05/14 0520  AST 36  --  28  ALT 28  --  22  ALKPHOS 92  --  62  BILITOT 1.3* 0.9 0.8  PROT 7.5  --  5.9*  ALBUMIN 4.5  --  3.3*   No results for input(s): LIPASE, AMYLASE in the last 168 hours. No results for input(s): AMMONIA in the last 168 hours. CBC:  Recent Labs Lab 12/04/14 1110 12/05/14 0520 12/06/14 0530  WBC 5.1 3.8* 4.6  NEUTROABS 3.5  --   --   HGB 8.9* 7.4* 7.8*  HCT 25.7* 21.8* 23.1*  MCV 98.8 103.3* 101.3*  PLT 199 153 164   Cardiac Enzymes: No results for input(s): CKTOTAL, CKMB, CKMBINDEX, TROPONINI in the last 168 hours. BNP: BNP (last 3 results) No results for input(s): BNP in the last 8760 hours.  ProBNP (last 3 results) No results for input(s): PROBNP in the last 8760 hours.  CBG:  Recent Labs Lab 12/05/14 0815 12/05/14 1153 12/05/14 1702 12/05/14 2128 12/06/14 0755  GLUCAP 237* 224* 182* 180* 221*       Signed:  Hannahgrace Lalli A  [facility] 12/06/2014, 11:24 AM

## 2014-12-06 NOTE — Care Management Note (Signed)
    Page 1 of 1   12/06/2014     4:25:14 PM CARE MANAGEMENT NOTE 12/06/2014  Patient:  Ashlee Hill, Ashlee Hill   Account Number:  [id number]  Date Initiated:  12/05/2014  Documentation initiated by:  Edwyna Shell  Subjective/Objective Assessment:   54 yo female admitted with dehydration due to vomiting     Action/Plan:   discharge planning   Anticipated DC Date:  12/07/2014   Anticipated DC Plan:  [place]  CM consult      Choice offered to / List presented to:             Status of service:  Completed, signed off Medicare Important Message given?   (If response is "NO", the following Medicare IM given date fields will be blank) Date Medicare IM given:   Medicare IM given by:   Date Additional Medicare IM given:   Additional Medicare IM given by:    Discharge Disposition:  HOME/SELF CARE  Per UR Regulation:  Reviewed for med. necessity/level of care/duration of stay  If discussed at [place] of Stay Meetings, dates discussed:    Comments:  12/05/14 Edwyna Shell RN BSN CM [phone] Patient confirmed her [facility] insurance. Discussed oral DM medications and the cost at [facility]. Then discussed cost of insulins with patient and provided a coupon card for [employer] products such as NovoLog and latus. Explained that with her commercial insurance and the coupon card each insulin will cost at most $25 a month. The patient then stated that she has met her deductible and has not been paying anything for her medications. This CM explained that she most likely will not have any copay on any discharge medications but to keep the coupon card if needed. Patient verbalized understanding and answered her questions.

## 2014-12-08 ENCOUNTER — Telehealth: Payer: Self-pay | Admitting: Hematology

## 2014-12-08 LAB — TYPE AND SCREEN
ABO/RH(D): O NEG
ANTIBODY SCREEN: NEGATIVE
Unit division: 0

## 2014-12-08 LAB — FOLATE RBC
FOLATE, RBC: 2356 ng/mL (ref >498)
Folate, Hemolysate: 513.6 ng/mL
Hematocrit: 21.8 % — ABNORMAL LOW (ref 34.0–46.6)

## 2014-12-08 NOTE — Telephone Encounter (Signed)
Left message to confirm appointment change to 1:45 04/22

## 2014-12-10 NOTE — Progress Notes (Signed)
Utilization review completed.  

## 2014-12-12 ENCOUNTER — Ambulatory Visit (HOSPITAL_BASED_OUTPATIENT_CLINIC_OR_DEPARTMENT_OTHER): Payer: 59

## 2014-12-12 ENCOUNTER — Ambulatory Visit: Payer: 59 | Admitting: Nurse Practitioner

## 2014-12-12 ENCOUNTER — Other Ambulatory Visit (HOSPITAL_BASED_OUTPATIENT_CLINIC_OR_DEPARTMENT_OTHER): Payer: 59

## 2014-12-12 ENCOUNTER — Other Ambulatory Visit: Payer: 59

## 2014-12-12 ENCOUNTER — Ambulatory Visit (HOSPITAL_BASED_OUTPATIENT_CLINIC_OR_DEPARTMENT_OTHER): Payer: 59 | Admitting: Nurse Practitioner

## 2014-12-12 ENCOUNTER — Telehealth: Payer: Self-pay | Admitting: Hematology

## 2014-12-12 ENCOUNTER — Encounter: Payer: Self-pay | Admitting: *Deleted

## 2014-12-12 VITALS — BP 140/89 | HR 109 | Temp 97.9°F | Resp 18 | Ht 65.0 in | Wt 227.0 lb

## 2014-12-12 DIAGNOSIS — Z5111 Encounter for antineoplastic chemotherapy: Secondary | ICD-10-CM | POA: Diagnosis not present

## 2014-12-12 DIAGNOSIS — C773 Secondary and unspecified malignant neoplasm of axilla and upper limb lymph nodes: Secondary | ICD-10-CM | POA: Diagnosis not present

## 2014-12-12 DIAGNOSIS — K1231 Oral mucositis (ulcerative) due to antineoplastic therapy: Secondary | ICD-10-CM

## 2014-12-12 DIAGNOSIS — C50212 Malignant neoplasm of upper-inner quadrant of left female breast: Secondary | ICD-10-CM | POA: Diagnosis not present

## 2014-12-12 DIAGNOSIS — Z5189 Encounter for other specified aftercare: Secondary | ICD-10-CM

## 2014-12-12 DIAGNOSIS — Z171 Estrogen receptor negative status [ER-]: Secondary | ICD-10-CM

## 2014-12-12 DIAGNOSIS — R739 Hyperglycemia, unspecified: Secondary | ICD-10-CM

## 2014-12-12 LAB — CBC WITH DIFFERENTIAL/PLATELET
BASO%: 0.8 % (ref 0.0–2.0)
Basophils Absolute: 0.1 10*3/uL (ref 0.0–0.1)
EOS ABS: 0 10*3/uL (ref 0.0–0.5)
EOS%: 0.5 % (ref 0.0–7.0)
HCT: 33.6 % — ABNORMAL LOW (ref 34.8–46.6)
HGB: 11 g/dL — ABNORMAL LOW (ref 11.6–15.9)
LYMPH%: 12.4 % — ABNORMAL LOW (ref 14.0–49.7)
MCH: 33.5 pg (ref 25.1–34.0)
MCHC: 32.7 g/dL (ref 31.5–36.0)
MCV: 102.4 fL — ABNORMAL HIGH (ref 79.5–101.0)
MONO#: 0.6 10*3/uL (ref 0.1–0.9)
MONO%: 9.7 % (ref 0.0–14.0)
NEUT#: 5 10*3/uL (ref 1.5–6.5)
NEUT%: 76.6 % (ref 38.4–76.8)
PLATELETS: 215 10*3/uL (ref 145–400)
RBC: 3.28 10*6/uL — AB (ref 3.70–5.45)
RDW: 18.8 % — ABNORMAL HIGH (ref 11.2–14.5)
WBC: 6.5 10*3/uL (ref 3.9–10.3)
lymph#: 0.8 10*3/uL — ABNORMAL LOW (ref 0.9–3.3)
nRBC: 0 % (ref 0–0)

## 2014-12-12 LAB — COMPREHENSIVE METABOLIC PANEL (CC13)
ALK PHOS: 118 U/L (ref 40–150)
ALT: 37 U/L (ref 0–55)
AST: 41 U/L — ABNORMAL HIGH (ref 5–34)
Albumin: 3.9 g/dL (ref 3.5–5.0)
Anion Gap: 15 mEq/L — ABNORMAL HIGH (ref 3–11)
BUN: 10 mg/dL (ref 7.0–26.0)
CO2: 20 mEq/L — ABNORMAL LOW (ref 22–29)
CREATININE: 0.9 mg/dL (ref 0.6–1.1)
Calcium: 9.4 mg/dL (ref 8.4–10.4)
Chloride: 102 mEq/L (ref 98–109)
EGFR: 75 mL/min/{1.73_m2} — AB (ref >90)
GLUCOSE: 185 mg/dL — AB (ref 70–140)
Potassium: 4.4 mEq/L (ref 3.5–5.1)
SODIUM: 138 meq/L (ref 136–145)
TOTAL PROTEIN: 7.1 g/dL (ref 6.4–8.3)
Total Bilirubin: 0.65 mg/dL (ref 0.20–1.20)

## 2014-12-12 MED ORDER — SODIUM CHLORIDE 0.9 % IV SOLN
Freq: Once | INTRAVENOUS | Status: AC
  Administered 2014-12-12: 16:00:00 via INTRAVENOUS

## 2014-12-12 MED ORDER — FOSAPREPITANT DIMEGLUMINE INJECTION 150 MG
Freq: Once | INTRAVENOUS | Status: AC
  Administered 2014-12-12: 16:00:00 via INTRAVENOUS
  Filled 2014-12-12: qty 5

## 2014-12-12 MED ORDER — HEPARIN SOD (PORK) LOCK FLUSH 100 UNIT/ML IV SOLN
500.0000 [IU] | Freq: Once | INTRAVENOUS | Status: AC | PRN
  Administered 2014-12-12: 500 [IU]
  Filled 2014-12-12: qty 5

## 2014-12-12 MED ORDER — PEGFILGRASTIM 6 MG/0.6ML ~~LOC~~ PSKT
6.0000 mg | PREFILLED_SYRINGE | Freq: Once | SUBCUTANEOUS | Status: AC
  Administered 2014-12-12: 6 mg via SUBCUTANEOUS
  Filled 2014-12-12: qty 0.6

## 2014-12-12 MED ORDER — DOXORUBICIN HCL CHEMO IV INJECTION 2 MG/ML
60.0000 mg/m2 | Freq: Once | INTRAVENOUS | Status: AC
  Administered 2014-12-12: 134 mg via INTRAVENOUS
  Filled 2014-12-12: qty 67

## 2014-12-12 MED ORDER — SODIUM CHLORIDE 0.9 % IV SOLN
600.0000 mg/m2 | Freq: Once | INTRAVENOUS | Status: AC
  Administered 2014-12-12: 1340 mg via INTRAVENOUS
  Filled 2014-12-12: qty 67

## 2014-12-12 MED ORDER — SODIUM CHLORIDE 0.9 % IJ SOLN
10.0000 mL | INTRAMUSCULAR | Status: DC | PRN
  Administered 2014-12-12: 10 mL
  Filled 2014-12-12: qty 10

## 2014-12-12 MED ORDER — PALONOSETRON HCL INJECTION 0.25 MG/5ML: 0.2500 mg | Freq: Once | INTRAVENOUS | Status: DC

## 2014-12-12 NOTE — Progress Notes (Addendum)
[facility] OFFICE PROGRESS NOTE   Diagnosis:   Breast cancer Oncology History   Breast cancer of upper-inner quadrant of left female breast  Staging form: Breast, AJCC 7th Edition  Clinical stage from 10/08/2014: Stage IIB (T2, N1, M0) - Unsigned  Pathologic: Stage IIB (T2, N1, cM0) - Unsigned       Breast cancer of upper-inner quadrant of left female breast   09/26/2014 Breast US 2.5 cm irregular mass in the upper inner left breast with adjacent 1.1 cm satellite mass and enlarged left axillary lymph nodes, highly suspicious for left breast malignancy and left axillary lymph node metastases. Tissue sampling is recommended.    09/26/2014 Mammogram Spot compression views of the left breast and routine views of both breasts demonstrate a 2 x 2.5 cm irregular mass in the upper inner left breast.    09/29/2014 Initial Biopsy 1. Breast, left, needle core biopsy, mass, 11 o'clock - INVASIVE MAMMARY CARCINOMA, SEE COMMENT. - MAMMARY CARCINOMA IN SITU. 2. Lymph node, needle/core biopsy, left axillary - ONE LYMPH NODE, POSITIVE FOR METASTATIC MAMMARY CARCINOMA (1/1).   09/29/2014 Receptors her2 Estrogen Receptor: 0%, NEGATIVE Progesterone Receptor: 0%, NEGATIVE Proliferation Marker Ki67: 93%   10/01/2014 Initial Diagnosis Breast cancer of upper-inner quadrant of left female breast   10/17/2014 Imaging CT and bone scan negative for distant metastasis    10/24/2014 -  Neoadjuvant Chemotherapy ddACx4 then weekly Taxol X12         INTERVAL HISTORY:    Ms. Hill returns as scheduled. She completed cycle 3 dose dense Adriamycin/Cytoxan on 11/21/2014. She reports developing nausea around day 3. The nausea was poorly controlled. She   tends to develop "throat pain" following each cycle of chemotherapy. She feels this is mucositis. No mouth sores. She was unable to eat and drink. She noted progressive weakness and anorexia. She developed significant pain over  the soles of both feet. She did not notice any erythema. No pain or redness on the palms. She was seen in the office on 12/04/2014 for dehydration. Her blood sugar returned at 422. She was subsequently admitted to the hospital. She received intravenous hydration. The elevated blood sugar was treated with sliding scale insulin. She was discharged home on metformin on 12/06/2014.  She reports she is feeling much better. She states she has had a "good week". She is eating and drinking without difficulty. No diarrhea. She does not have a glucometer to check her blood sugars. She reports good compliance with the metformin. No nausea/vomiting at present. No diarrhea. She reports developing a "blister" at the bottom of the left foot. She removed the loose skin. She notes mild tenderness associated with the "new skin".  Objective:  Vital signs in last 24 hours:  Blood pressure 140/89, pulse 109, temperature 97.9 F (36.6 C), temperature source Oral, resp. rate 18, height '5\' 5"'  (1.651 m), weight 227 lb (102.967 kg), SpO2 98 %.    HEENT:  No thrush or ulcers. Lymphatics:  No palpable cervical, supra clavicular or axillary lymph nodes. Resp:  Lungs clear bilaterally. Cardio:  Regular rate and rhythm. GI:  Abdomen soft and nontender. No hepatomegaly. Vascular:  No leg edema. Skin:  Large area of desquamation mid upper portion left sole with associated tenderness. Right sole with scattered callus formation and marked dryness.   Lab Results:  Lab Results  Component Value Date   WBC 6.5 12/12/2014   HGB 11.0* 12/12/2014   HCT 33.6* 12/12/2014   MCV 102.4* 12/12/2014  PLT 215 12/12/2014   NEUTROABS 5.0 12/12/2014    Imaging:  No results found.  Medications: I have reviewed the patient's current medications.  Assessment/Plan: 1. Left breast invasive ductal carcinoma, cT2N1M0, stage IIB, triple negative diagnosed 09/29/2014.  Neoadjuvant dose dense Adriamycin/Cytoxan initiated 10/24/2014 with  4 cycles planned to be followed by Taxol weekly 12. 2. Mucositis related to chemotherapy. 3. Question hand-foot syndrome following cycle 3 Adriamycin/Cytoxan. 4. Hyperglycemia. She was recently hospitalized with a blood sugar of greater than 400. Now on metformin. 5.  Hospitalization 12/04/2014 through 12/06/2014 with nausea/vomiting/dehydration and hyperglycemia.   Disposition: Ashlee Hill appears stable. She has completed 3 cycles of dose dense Adriamycin/Cytoxan. Plan to proceed with the fourth and final cycle of Adriamycin/Cytoxan today as scheduled.  She was hospitalized following cycle 3 with nausea/vomiting/dehydration and hyperglycemia. She understands to contact the office if the nausea and vomiting cannot be controlled at home and we will bring her in for IV fluids and IV antiemetics.  With regard to the hyperglycemia she is now on metformin. She has not seen her PCP since discharge from the hospital and has not been checking her blood sugars. We called a prescription to her pharmacy today for a glucometer, test strips and lancets. She will make an appointment with her PCP.  She developed significant bilateral foot pain and subsequent desquamation of the skin on the left foot following cycle 3 Adriamycin/Cytoxan. Question hand-foot syndrome. She understands to contact the office if symptoms recur following cycle 4.  She will return in one week for a follow-up visit. The plan is to initiate weekly Taxol beginning 12/26/2014. She will contact the office in the interim as outlined above or with any other problems.  Patient seen with Dr. Burr Medico. 25 minutes were spent face-to-face at today's visit with the majority of that time involved in counseling/coordination of care.  Ned Card ANP/GNP-BC   12/12/2014  2:37 PM  Attending addendum I have seen the patient, examined her. I agree with the assessment and and plan and have edited the notes.  Lab reviewed, she is overall much better after  recent hospital discharge, new skin desquamation and foot pain, no signs of infection, possible related to chemo.  Will proceed cycle 4 AC today. RTC in 2 weeks to start weekly Taxol.  Truitt Merle  12/12/2014

## 2014-12-12 NOTE — Patient Instructions (Signed)
[facility] Discharge Instructions for Patients Receiving Chemotherapy  Today you received the following chemotherapy agents Adriamycin and Cytoxan.  To help prevent nausea and vomiting after your treatment, we encourage you to take your nausea medication as prescribed.   If you develop nausea and vomiting that is not controlled by your nausea medication, call the clinic.   BELOW ARE SYMPTOMS THAT SHOULD BE REPORTED IMMEDIATELY:  *FEVER GREATER THAN 100.5 F  *CHILLS WITH OR WITHOUT FEVER  NAUSEA AND VOMITING THAT IS NOT CONTROLLED WITH YOUR NAUSEA MEDICATION  *UNUSUAL SHORTNESS OF BREATH  *UNUSUAL BRUISING OR BLEEDING  TENDERNESS IN MOUTH AND THROAT WITH OR WITHOUT PRESENCE OF ULCERS  *URINARY PROBLEMS  *BOWEL PROBLEMS  UNUSUAL RASH Items with * indicate a potential emergency and should be followed up as soon as possible.  Feel free to call the clinic you have any questions or concerns. The clinic phone number is [phone].  Please show the CHEMO ALERT CARD at check-in to the Emergency Department and triage nurse.   

## 2014-12-12 NOTE — Progress Notes (Unsigned)
Met with pt prior to chemo treatment. Relate she was hospitalized for n/v and dehydration. Discussed ways to keep hydrated. Doing well now. Denies questions or needs at this time. Encourage pt to call with concerns. Received verbal understanding. Gave emotional support and encouragement

## 2014-12-12 NOTE — Telephone Encounter (Signed)
gave and printed appt sched and avs for pt for April and May ....sed added tx. °

## 2014-12-14 ENCOUNTER — Encounter: Payer: Self-pay | Admitting: Nurse Practitioner

## 2014-12-15 ENCOUNTER — Encounter: Payer: Self-pay | Admitting: Hematology

## 2014-12-15 ENCOUNTER — Other Ambulatory Visit: Payer: Self-pay | Admitting: *Deleted

## 2014-12-15 NOTE — Telephone Encounter (Signed)
Left message for patient to call back regarding checking blood sugar.  Patient needs to only check blood sugar once daily.

## 2014-12-15 NOTE — Progress Notes (Signed)
I faxed prior auth req for onetouch ultra test strips [employer]

## 2014-12-17 ENCOUNTER — Encounter: Payer: Self-pay | Admitting: Hematology

## 2014-12-17 NOTE — Progress Notes (Signed)
Per [employer] onetouch ultra blue are approved 12/15/14-12/15/15. Pa# [id number]. I will fwd to medical records.

## 2014-12-19 ENCOUNTER — Other Ambulatory Visit (HOSPITAL_BASED_OUTPATIENT_CLINIC_OR_DEPARTMENT_OTHER): Payer: 59

## 2014-12-19 ENCOUNTER — Ambulatory Visit: Payer: 59

## 2014-12-19 ENCOUNTER — Telehealth: Payer: Self-pay | Admitting: Hematology

## 2014-12-19 ENCOUNTER — Encounter: Payer: Self-pay | Admitting: Hematology

## 2014-12-19 ENCOUNTER — Ambulatory Visit (HOSPITAL_BASED_OUTPATIENT_CLINIC_OR_DEPARTMENT_OTHER): Payer: 59 | Admitting: Hematology

## 2014-12-19 VITALS — BP 149/87 | HR 95 | Temp 97.8°F | Resp 20 | Ht 65.0 in | Wt 223.4 lb

## 2014-12-19 DIAGNOSIS — Z171 Estrogen receptor negative status [ER-]: Secondary | ICD-10-CM | POA: Diagnosis not present

## 2014-12-19 DIAGNOSIS — C50212 Malignant neoplasm of upper-inner quadrant of left female breast: Secondary | ICD-10-CM

## 2014-12-19 DIAGNOSIS — E119 Type 2 diabetes mellitus without complications: Secondary | ICD-10-CM

## 2014-12-19 LAB — CBC WITH DIFFERENTIAL/PLATELET
BASO%: 3.9 % — ABNORMAL HIGH (ref 0.0–2.0)
Basophils Absolute: 0.1 10*3/uL (ref 0.0–0.1)
EOS ABS: 0 10*3/uL (ref 0.0–0.5)
EOS%: 1.5 % (ref 0.0–7.0)
HCT: 27.8 % — ABNORMAL LOW (ref 34.8–46.6)
HGB: 9.5 g/dL — ABNORMAL LOW (ref 11.6–15.9)
LYMPH%: 29.5 % (ref 14.0–49.7)
MCH: 34.2 pg — AB (ref 25.1–34.0)
MCHC: 34.1 g/dL (ref 31.5–36.0)
MCV: 100.1 fL (ref 79.5–101.0)
MONO#: 0 10*3/uL — ABNORMAL LOW (ref 0.1–0.9)
MONO%: 2.4 % (ref 0.0–14.0)
NEUT#: 0.9 10*3/uL — ABNORMAL LOW (ref 1.5–6.5)
NEUT%: 62.7 % (ref 38.4–76.8)
Platelets: 99 10*3/uL — ABNORMAL LOW (ref 145–400)
RBC: 2.78 10*6/uL — ABNORMAL LOW (ref 3.70–5.45)
RDW: 17 % — AB (ref 11.2–14.5)
WBC: 1.5 10*3/uL — ABNORMAL LOW (ref 3.9–10.3)
lymph#: 0.4 10*3/uL — ABNORMAL LOW (ref 0.9–3.3)

## 2014-12-19 LAB — COMPREHENSIVE METABOLIC PANEL (CC13)
ALK PHOS: 95 U/L (ref 40–150)
ALT: 22 U/L (ref 0–55)
ANION GAP: 13 meq/L — AB (ref 3–11)
AST: 20 U/L (ref 5–34)
Albumin: 3.8 g/dL (ref 3.5–5.0)
BILIRUBIN TOTAL: 0.74 mg/dL (ref 0.20–1.20)
BUN: 18.1 mg/dL (ref 7.0–26.0)
CO2: 23 mEq/L (ref 22–29)
CREATININE: 1.1 mg/dL (ref 0.6–1.1)
Calcium: 9.1 mg/dL (ref 8.4–10.4)
Chloride: 99 mEq/L (ref 98–109)
EGFR: 58 mL/min/{1.73_m2} — ABNORMAL LOW (ref >90)
Glucose: 187 mg/dl — ABNORMAL HIGH (ref 70–140)
Potassium: 4.4 mEq/L (ref 3.5–5.1)
Sodium: 134 mEq/L — ABNORMAL LOW (ref 136–145)
TOTAL PROTEIN: 7 g/dL (ref 6.4–8.3)

## 2014-12-19 NOTE — Telephone Encounter (Signed)
Gave avs & calendar for May thru July-[facility]. Sent message to schedule treatment-AB

## 2014-12-19 NOTE — Progress Notes (Signed)
1 week Ashlee [place]  [phone] Fax:([phone]) [place] Note   Patient Care Team: Seward Carol, MD as PCP - General (Internal Medicine) Erroll Luna, MD as Consulting Physician (General Surgery) Truitt Merle, MD as Consulting Physician (Hematology) Thea Silversmith, MD as Consulting Physician (Radiation Oncology) Rockwell Germany, RN as Registered Nurse Mauro Kaufmann, RN as Registered Nurse 12/19/2014  CHIEF COMPLAINTS  Follow up breast cancer   Oncology History   Breast cancer of upper-inner quadrant of left female breast   Staging form: Breast, AJCC 7th Edition     Clinical stage from 10/08/2014: Stage IIB (T2, N1, M0) - Unsigned     Pathologic: Stage IIB (T2, N1, cM0) - Unsigned       Breast cancer of upper-inner quadrant of left female breast   09/26/2014 Breast US 2.5 cm irregular mass in the upper inner left breast with adjacent 1.1 cm satellite mass and enlarged left axillary lymph nodes, highly suspicious for left breast malignancy and left axillary lymph node metastases. Tissue sampling is recommended.    09/26/2014 Mammogram Spot compression views of the left breast and routine views of both breasts demonstrate a 2 x 2.5 cm irregular mass in the upper inner left breast.    09/29/2014 Initial Biopsy 1. Breast, left, needle core biopsy, mass, 11 o'clock - INVASIVE MAMMARY CARCINOMA, SEE COMMENT. - MAMMARY CARCINOMA IN SITU. 2. Lymph node, needle/core biopsy, left axillary - ONE LYMPH NODE, POSITIVE FOR METASTATIC MAMMARY CARCINOMA (1/1).   09/29/2014 Receptors her2 Estrogen Receptor: 0%, NEGATIVE Progesterone Receptor: 0%, NEGATIVE Proliferation Marker Ki67: 93%   10/01/2014 Initial Diagnosis Breast cancer of upper-inner quadrant of left female breast   10/17/2014 Imaging CT and bone scan negative for distant metastasis    10/24/2014 -  Adjuvant Chemotherapy ddACx4 then weekly Taxol X12     HISTORY OF PRESENTING ILLNESS:  DAWNETTE Hill  54 y.o. female presents to our multidisciplinary breast clinic today to discuss the management of her newly diagnosed breast cancer  She noticed a left breast lump in August 2015, no tenderness, skin or nipple change. She otherwise felt well. She did not seek immediate medical attention due to lack of insurance. She finally got her insurance approved and saw her primary care physician recently. She was referred for mammogram which showed a 2.5 cm irregular mass in the upper inner left breast. She underwent left breast mass and axillary node biopsy on 09/29/2014 and both biopsy showed invasive ductal carcinoma, ER negative, PR negative, HER-2 negative.  She otherwise feels well, no pain or ther complains. She has good appetite and her weight is stable.  CURRENT THERAPY: Neoadjuvant chemotherapy with ddAC every 2 weeks x4, followed by weekly Taxol 12, started on 10/24/2058, Taxol starting on 12/26/14  INTERIM HISTORY; Miesha returns for follow-up after 4 cycles of before meals last week. She was recently hospitalized for hyperglycemia and dehydration. She is on metformin L, with some GI side effects gastric discomfort, she has received 4 m and will start checking her sugar. Her appetite is moderate, she eats 1-2 meals a day , but drinks adequately. No fever or chills. Nausea is mild, she takes Zofran and Compazine as needed.  MEDICAL HISTORY:  Past Medical History  Diagnosis Date  . Breast cancer of upper-inner quadrant of left female breast 10/01/2014  . Hypertension   . Family history of breast cancer   . Wears glasses     driving  . Diabetes mellitus without complication  12/04/14    chemo caused diabetes per pt     SURGICAL HISTORY: Past Surgical History  Procedure Laterality Date  . Tonsillectomy    . Wisdom tooth extraction    . Portacath placement Right 10/21/2014    Procedure: INSERTION PORT-A-CATH;  Surgeon: Erroll Luna, MD;  Location: [facility];  Service: General;   Laterality: Right;     GYN HISTORY  Menarchal: 11 LMP: 09/27/2014 Contraceptive: no HRT:  G0P0    SOCIAL HISTORY: History   Social History  . Marital Status: Single    Spouse Name: N/A  . Number of Children: N/A  . Years of Education: N/A   Occupational History  . Not on file.   Social History Main Topics  . Smoking status: Never Smoker   . Smokeless tobacco: Never Used  . Alcohol Use: 0.6 oz/week    1 Glasses of wine per week     Comment: socail drinker  . Drug Use: No  . Sexual Activity: Not on file   Other Topics Concern  . Not on file   Social History Narrative    FAMILY HISTORY: Family History  Problem Relation Age of Onset  . Prostate cancer Father 74    currently 75  . Breast cancer Paternal Aunt 91    deceased 41  . Lung cancer Maternal Grandfather 52    smoker; deceased  . Thyroid cancer Cousin 43    pat first cousin; currently 74     Father had prostate cancer at age of 29 Paternal aunt had breast caner in her 35's Paternal cousin had thyroid cancer at age of before 38 Maternal grandfather had lung cancer    ALLERGIES:  is allergic to tegaderm ag mesh.  MEDICATIONS:  Current Outpatient Prescriptions  Medication Sig Dispense Refill  . ALPRAZolam (XANAX) 1 MG tablet Take 1-2 mg by mouth 2 (two) times daily. Take 1 tablet (1 mg) in the morning & Take 2 tablets (2 mg) in the evening.    . Alum & Mag Hydroxide-Simeth (MAGIC MOUTHWASH W/LIDOCAINE) SOLN Take 5 mLs by mouth 4 (four) times daily as needed for mouth pain. 120 mL 1  . aspirin 81 MG tablet Take 81 mg by mouth daily.    Marland Kitchen atorvastatin (LIPITOR) 10 MG tablet Take 10 mg by mouth daily.    . calcium carbonate (OS-CAL) 600 MG TABS tablet Take 600 mg by mouth daily with breakfast.    . gabapentin (NEURONTIN) 600 MG tablet Take 600 mg by mouth at bedtime. As needed for sleep or anxiety    . hydrochlorothiazide (HYDRODIURIL) 25 MG tablet Take 25 mg by mouth daily.     Marland Kitchen lidocaine-prilocaine  (EMLA) cream Apply to affected area once 30 g 3  . LORazepam (ATIVAN) 1 MG tablet Take 1 tablet (1 mg total) by mouth at bedtime as needed for sleep. 30 tablet 0  . meclizine (ANTIVERT) 25 MG tablet Take 25 mg by mouth 3 (three) times daily as needed for dizziness.    . menthol-cetylpyridinium (CEPACOL) 3 MG lozenge Take 1 lozenge by mouth every 2 (two) hours as needed for sore throat (sore throat).    . metFORMIN (GLUCOPHAGE) 1000 MG tablet Take 1 tablet (1,000 mg total) by mouth 2 (two) times daily with a meal. 60 tablet 2  . metoprolol (LOPRESSOR) 100 MG tablet Take 100 mg by mouth daily.    . ondansetron (ZOFRAN) 8 MG tablet Take 1 tablet (8 mg total) by mouth every 8 (eight)  hours as needed for nausea. Start on the third day after chemotherapy. 30 tablet 1  . PRESCRIPTION MEDICATION Chemo [facility]    . prochlorperazine (COMPAZINE) 10 MG tablet Take 1 tablet (10 mg total) by mouth every 6 (six) hours as needed for nausea or vomiting. 30 tablet 1  . venlafaxine XR (EFFEXOR-XR) 150 MG 24 hr capsule Take 150 mg by mouth daily with breakfast.    . dexamethasone (DECADRON) 4 MG tablet 1/2 tablet (2 mg) Q AM for nausea PRN. (Patient not taking: Reported on 12/04/2014) 10 tablet 0  . oxyCODONE-acetaminophen (ROXICET) 5-325 MG per tablet Take 1-2 tablets by mouth every 4 (four) hours as needed. (Patient not taking: Reported on 12/04/2014) 30 tablet 0   No current facility-administered medications for this visit.    REVIEW OF SYSTEMS:   Constitutional: Denies fevers, chills or abnormal night sweats Eyes: Denies blurriness of vision, double vision or watery eyes Ears, nose, mouth, throat, and face: resolving mucositis and sore throat Respiratory: Denies cough, dyspnea or wheezes Cardiovascular: Denies palpitation, chest discomfort or lower extremity swelling Gastrointestinal:  Denies nausea, heartburn or change in bowel habits Skin: Denies abnormal skin rashes Lymphatics: Denies new lymphadenopathy or  easy bruising Neurological:Denies numbness, tingling or new weaknesses Behavioral/Psych: Mood is stable, no new changes  All other systems were reviewed with the patient and are negative.  PHYSICAL EXAMINATION: ECOG PERFORMANCE STATUS: 0 - Asymptomatic  Filed Vitals:   12/19/14 0917  BP: 149/87  Pulse: 95  Temp: 97.8 F (36.6 C)  Resp: 20   Filed Weights   12/19/14 0917  Weight: 223 lb 6.4 oz (101.334 kg)    GENERAL:alert, no distress and comfortable SKIN: skin color, texture, turgor are normal, no rashes or significant lesions. Port site looks clean  EYES: normal, conjunctiva are pink and non-injected, sclera clear OROPHARYNX:no exudate, no erythema and lips, buccal mucosa, and tongue normal  NECK: supple, thyroid normal size, non-tender, without nodularity LYMPH:  no palpable lymphadenopathy in the cervical, axillary or inguinal LUNGS: clear to auscultation and percussion with normal breathing effort HEART: regular rate & rhythm and no murmurs and no lower extremity edema ABDOMEN:abdomen soft, non-tender and normal bowel sounds Musculoskeletal:no cyanosis of digits and no clubbing  PSYCH: alert & oriented x 3 with fluent speech NEURO: no focal motor/sensory deficits Breasts: Breast inspection showed them to be symmetrical with no nipple discharge. Palpation of the left breasts showed a <1cm residual small mass in the upper mid quadrant (smaller than before), no palpable axillary lymph node. Exam of the right breast and axillary was normal.    LABORATORY DATA:  I have reviewed the data as listed Lab Results  Component Value Date   WBC 1.5* 12/19/2014   HGB 9.5* 12/19/2014   HCT 27.8* 12/19/2014   MCV 100.1 12/19/2014   PLT 99* 12/19/2014    Recent Labs  12/04/14 1134 12/04/14 1820 12/05/14 0520 12/06/14 0530 12/12/14 1350  NA 129*  --  138 138 138  K 3.6  --  3.3* 4.1 4.4  CL 91*  --  103 106  --   CO2 25  --  27 24 20*  GLUCOSE 422*  --  227* 204* 185*    BUN 10  --  7 <5* 10.0  CREATININE 1.27*  --  1.03 0.79 0.9  CALCIUM 8.8  --  8.1* 7.7* 9.4  GFRNONAA 48*  --  61* >90  --   GFRAA 55*  --  71* >90  --  PROT 7.5  --  5.9*  --  7.1  ALBUMIN 4.5  --  3.3*  --  3.9  AST 36  --  28  --  41*  ALT 28  --  22  --  37  ALKPHOS 92  --  62  --  118  BILITOT 1.3* 0.9 0.8  --  0.65  BILIDIR  --  0.2  --   --   --   IBILI  --  0.7  --   --   --     PATHOLOGY REPORT 09/26/2014 Diagnosis 1. Breast, left, needle core biopsy, mass, 11 o'clock - INVASIVE MAMMARY CARCINOMA, SEE COMMENT. - MAMMARY CARCINOMA IN SITU. 2. Lymph node, needle/core biopsy, left axillary - ONE LYMPH NODE, POSITIVE FOR METASTATIC MAMMARY CARCINOMA (1/1). Microscopic Comment 1. Although grade of tumor is best assessed at resection, with these biopsies, both the in situ and invasive carcinoma are grade II. The invasive carcinoma demonstrates strong diffuse E-cadherin expression; supporting a ductal phenotype. 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  Estrogen Receptor: 0%, NEGATIVE Progesterone Receptor: 0%, NEGATIVE Proliferation Marker Ki67: 93% 1. A sample was sent to [facility] for HER-2 testing by FISH. The results are as follows: Negative.  RADIOGRAPHIC STUDIES: I have personally reviewed the radiological images as listed and agreed with the findings in the report.  Mr Breast Bilateral W Wo Contrast 10/08/2014    IMPRESSION: 1. Biopsy proven malignancy in the upper inner left breast with adjacent/contiguous areas of nodularity measures up to 3.5 cm, with multiple morphologically abnormal axillary lymph nodes compatible with known axillary metastases.  2. No MRI evidence of malignancy in the right breast.  RECOMMENDATION: Treatment plan for known left breast malignancy.  BI-RADS CATEGORY  6: Known biopsy-proven malignancy.    Electronically Signed   By: Everlean Alstrom M.D.   On: 10/08/2014 10:29   US Breast Ltd Uni Left Inc Axilla 09/26/2014    IMPRESSION: 2.5 cm irregular mass in the upper inner left breast with adjacent 1.1 cm satellite mass and enlarged left axillary lymph nodes, highly suspicious for left breast malignancy and left axillary lymph node metastases. Tissue sampling is recommended.  No mammographic evidence of right breast malignancy.    CT chest, abdomen and pelvis with contrast 10/17/2014 IMPRESSION: 1. Left breast lesion is identified compatible with the clinical history of breast cancer. 2. Enlarged left axillary lymph nodes suspicious for metastatic adenopathy. 3. No specific features identified to suggest distant metastatic disease. 4. Hepatic steatosis.  Bone scan 10/17/2014 IMPRESSION: 1. The uptake pattern within the skeleton is not highly suspicious for malignancy. However, subtle increased uptake in the midshaft of the right humerus and the lower lumbar spine posteriorly merits further evaluation with plain films. 2. The uptake over the lower extremities is consistent with degenerative change.  Lumbar spine X-ray 10/20/14 IMPRESSION: No lytic or sclerotic osseous lesion.  Probable degenerative facet osteoarthritic change at L4-L5 and L5-S1 which may correspond to the area of abnormal uptake at recent bone scan.  RIGHT HUMERUS X-RAY 10/20/2014 IMPRESSION: No abnormality seen to correspond to abnormal uptake seen on bone scan   ASSESSMENT & PLAN:  54 year old pre-menopausal female with past medical history of hypertension, presented with a palpable left breast mass.  1. Left breast invasive ductal carcinoma, cT2N1M0, stage IIB, triple negative  -I discussed her all imaging finding and biopsy results extensively with patient.  Her ultrasound and MRI breast reviewed multiple left axillary lymph nodes, at least N1 disease, possible N2. I discussed with her and she has  locally advanced disease, and triple-negative breast cancer 10 to be more aggressive with early metastasis and cancer recurrence after surgery. -a staging CT showed no evidence of distant metastasis, the subtle increased uptake in the right humerus and lower lumbar spine on the bone scan was further evaluated by x-ray today, which were negative. - I recommend neoadjuvant chemotherapy given his very high risk of cancer recurrence after surgery alone. -She has no history of cardiac disease, normal EF on echo,  I would recommend dose dense Adriamycin and Cytoxan followed by paclitaxel.  -Given her young age and triple negative disease, positive family history, she was referred to see genetic counselor for genetic testing, her genetic testing results are still pending. -If she has positive BRCA1/2 mutation, she will be a good candidate for the NSABP B-55 clinical trial. -Lab reviewed, she is quite neutropenic from chemotherapy last week. Neutropenic precaution reviewed with her again -Start weekly Taxol next week if her counts recover.  2. Infertility -We discussed she may experience menopause with chemotherapy, the potentially not able to be pregnant afterwards. -She does not desire to have children in the future. -She has not had menstrual periods since she started chemotherapy  3.Anxiety and coping -she will take her xanax needed -I encouraged her to think positive -She has good support from her parents and friend.   4. Nausea, vomiting, anorexia -The second to chemotherapy -She knows to take Compazine and Zofran as needed -I encouraged her to eat small female but for more frequent, and take nutrition supplement  5. Newly diagnosed type 2 diabetes -I asked her to see her primary care physician as soon as possible, she has called for appointment -Continue metformin, and check blood glucose 2-3 times a day -I'll likely need to cut down her dexamethasone before Taxol, steroids induced  hyperglycemia discussed.  Plan -Return to clinic in 1 week to start weekly Taxol, decrease premeds dexamethasone to 10 mg   All questions were answered. The patient knows to call the clinic with any problems, questions or concerns. I spent 20 minutes counseling the patient face to face. The total time spent in the appointment was 25 minutes and more than 50% was on counseling.     Truitt Merle, MD 12/19/2014 9:46 AM

## 2014-12-20 ENCOUNTER — Ambulatory Visit: Payer: 59

## 2014-12-25 ENCOUNTER — Encounter: Payer: Self-pay | Admitting: Hematology

## 2014-12-25 ENCOUNTER — Other Ambulatory Visit: Payer: Self-pay | Admitting: *Deleted

## 2014-12-25 DIAGNOSIS — C50212 Malignant neoplasm of upper-inner quadrant of left female breast: Secondary | ICD-10-CM

## 2014-12-25 MED ORDER — LORAZEPAM 1 MG PO TABS: 1.0000 mg | ORAL_TABLET | Freq: Every evening | ORAL | Status: DC | PRN

## 2014-12-25 NOTE — Progress Notes (Signed)
Pt received denials and bills stating no referral was obtained.  I was able to obtain the referral from pts PCP and Rhys Martini entered it in the pt's chart.  I emailed Elmyra Ricks in billing to resubmit bills to [facility] from 10/08/14 to 11/21/14.  I've attempted to call pt several times to inform her of this but have been unable to reach her.

## 2014-12-25 NOTE — Telephone Encounter (Signed)
Refill called in for patient. Called pt @ home to let her know that the Lorazepam will be ready this afternoon. While speaking to her, she had some insurance questions and referred her to Care Management. She verbalized understanding.

## 2014-12-26 ENCOUNTER — Other Ambulatory Visit: Payer: 59

## 2014-12-26 ENCOUNTER — Ambulatory Visit (HOSPITAL_BASED_OUTPATIENT_CLINIC_OR_DEPARTMENT_OTHER): Payer: 59 | Admitting: Hematology

## 2014-12-26 ENCOUNTER — Ambulatory Visit (HOSPITAL_BASED_OUTPATIENT_CLINIC_OR_DEPARTMENT_OTHER): Payer: 59

## 2014-12-26 ENCOUNTER — Ambulatory Visit: Payer: 59

## 2014-12-26 ENCOUNTER — Other Ambulatory Visit (HOSPITAL_BASED_OUTPATIENT_CLINIC_OR_DEPARTMENT_OTHER): Payer: 59

## 2014-12-26 ENCOUNTER — Encounter: Payer: Self-pay | Admitting: Hematology

## 2014-12-26 VITALS — BP 138/87 | HR 81 | Temp 98.1°F | Resp 20

## 2014-12-26 VITALS — BP 159/85 | HR 109 | Temp 98.0°F | Resp 20 | Ht 65.0 in | Wt 225.5 lb

## 2014-12-26 DIAGNOSIS — E119 Type 2 diabetes mellitus without complications: Secondary | ICD-10-CM

## 2014-12-26 DIAGNOSIS — C50212 Malignant neoplasm of upper-inner quadrant of left female breast: Secondary | ICD-10-CM | POA: Diagnosis not present

## 2014-12-26 DIAGNOSIS — Z5111 Encounter for antineoplastic chemotherapy: Secondary | ICD-10-CM | POA: Diagnosis not present

## 2014-12-26 DIAGNOSIS — Z171 Estrogen receptor negative status [ER-]: Secondary | ICD-10-CM

## 2014-12-26 DIAGNOSIS — C773 Secondary and unspecified malignant neoplasm of axilla and upper limb lymph nodes: Secondary | ICD-10-CM | POA: Diagnosis not present

## 2014-12-26 LAB — CBC WITH DIFFERENTIAL/PLATELET
BASO%: 1 % (ref 0.0–2.0)
Basophils Absolute: 0 10*3/uL (ref 0.0–0.1)
EOS%: 0.7 % (ref 0.0–7.0)
Eosinophils Absolute: 0 10*3/uL (ref 0.0–0.5)
HCT: 25.5 % — ABNORMAL LOW (ref 34.8–46.6)
HGB: 8.7 g/dL — ABNORMAL LOW (ref 11.6–15.9)
LYMPH%: 16.5 % (ref 14.0–49.7)
MCH: 34.7 pg — ABNORMAL HIGH (ref 25.1–34.0)
MCHC: 34.1 g/dL (ref 31.5–36.0)
MCV: 101.6 fL — ABNORMAL HIGH (ref 79.5–101.0)
MONO#: 0.6 10*3/uL (ref 0.1–0.9)
MONO%: 15.2 % — ABNORMAL HIGH (ref 0.0–14.0)
NEUT#: 2.7 10*3/uL (ref 1.5–6.5)
NEUT%: 66.6 % (ref 38.4–76.8)
Platelets: 122 10*3/uL — ABNORMAL LOW (ref 145–400)
RBC: 2.51 10*6/uL — ABNORMAL LOW (ref 3.70–5.45)
RDW: 17.9 % — ABNORMAL HIGH (ref 11.2–14.5)
WBC: 4.1 10*3/uL (ref 3.9–10.3)
lymph#: 0.7 10*3/uL — ABNORMAL LOW (ref 0.9–3.3)
nRBC: 4 % — ABNORMAL HIGH (ref 0–0)

## 2014-12-26 LAB — COMPREHENSIVE METABOLIC PANEL (CC13)
ALK PHOS: 163 U/L — AB (ref 40–150)
ALT: 24 U/L (ref 0–55)
AST: 22 U/L (ref 5–34)
Albumin: 4 g/dL (ref 3.5–5.0)
Anion Gap: 18 mEq/L — ABNORMAL HIGH (ref 3–11)
BUN: 19.3 mg/dL (ref 7.0–26.0)
CALCIUM: 8.8 mg/dL (ref 8.4–10.4)
CHLORIDE: 98 meq/L (ref 98–109)
CO2: 22 mEq/L (ref 22–29)
Creatinine: 1 mg/dL (ref 0.6–1.1)
EGFR: 63 mL/min/{1.73_m2} — ABNORMAL LOW (ref >90)
Glucose: 236 mg/dl — ABNORMAL HIGH (ref 70–140)
Potassium: 3.9 mEq/L (ref 3.5–5.1)
Sodium: 138 mEq/L (ref 136–145)
Total Bilirubin: 0.6 mg/dL (ref 0.20–1.20)
Total Protein: 7.2 g/dL (ref 6.4–8.3)

## 2014-12-26 MED ORDER — FAMOTIDINE IN NACL 20-0.9 MG/50ML-% IV SOLN
20.0000 mg | Freq: Once | INTRAVENOUS | Status: AC
  Administered 2014-12-26: 20 mg via INTRAVENOUS

## 2014-12-26 MED ORDER — DIPHENHYDRAMINE HCL 50 MG/ML IJ SOLN
50.0000 mg | Freq: Once | INTRAMUSCULAR | Status: AC
  Administered 2014-12-26: 50 mg via INTRAVENOUS

## 2014-12-26 MED ORDER — DIPHENHYDRAMINE HCL 50 MG/ML IJ SOLN
INTRAMUSCULAR | Status: AC
  Filled 2014-12-26: qty 1

## 2014-12-26 MED ORDER — SODIUM CHLORIDE 0.9 % IJ SOLN
10.0000 mL | INTRAMUSCULAR | Status: DC | PRN
  Administered 2014-12-26: 10 mL
  Filled 2014-12-26: qty 10

## 2014-12-26 MED ORDER — SODIUM CHLORIDE 0.9 % IV SOLN
Freq: Once | INTRAVENOUS | Status: AC
  Administered 2014-12-26: 13:00:00 via INTRAVENOUS
  Filled 2014-12-26: qty 4

## 2014-12-26 MED ORDER — SODIUM CHLORIDE 0.9 % IV SOLN
Freq: Once | INTRAVENOUS | Status: AC
  Administered 2014-12-26: 12:00:00 via INTRAVENOUS

## 2014-12-26 MED ORDER — FAMOTIDINE IN NACL 20-0.9 MG/50ML-% IV SOLN
INTRAVENOUS | Status: AC
  Filled 2014-12-26: qty 50

## 2014-12-26 MED ORDER — HEPARIN SOD (PORK) LOCK FLUSH 100 UNIT/ML IV SOLN
500.0000 [IU] | Freq: Once | INTRAVENOUS | Status: AC | PRN
  Administered 2014-12-26: 500 [IU]
  Filled 2014-12-26: qty 5

## 2014-12-26 MED ORDER — DEXTROSE 5 % IV SOLN
80.0000 mg/m2 | Freq: Once | INTRAVENOUS | Status: AC
  Administered 2014-12-26: 174 mg via INTRAVENOUS
  Filled 2014-12-26: qty 29

## 2014-12-26 NOTE — Progress Notes (Signed)
1 week [facility]  Telephone:(336) [phone] Fax:(336) Tobias Note   Patient Care Team: Seward Carol, MD as PCP - General (Internal Medicine) Erroll Luna, MD as Consulting Physician (General Surgery) Truitt Merle, MD as Consulting Physician (Hematology) Thea Silversmith, MD as Consulting Physician (Radiation Oncology) Rockwell Germany, RN as Registered Nurse Mauro Kaufmann, RN as Registered Nurse 12/26/2014  CHIEF COMPLAINTS  Follow up breast cancer   Oncology History   Breast cancer of upper-inner quadrant of left female breast   Staging form: Breast, AJCC 7th Edition     Clinical stage from 10/08/2014: Stage IIB (T2, N1, M0) - Unsigned     Pathologic: Stage IIB (T2, N1, cM0) - Unsigned       Breast cancer of upper-inner quadrant of left female breast   09/26/2014 Breast US 2.5 cm irregular mass in the upper inner left breast with adjacent 1.1 cm satellite mass and enlarged left axillary lymph nodes, highly suspicious for left breast malignancy and left axillary lymph node metastases. Tissue sampling is recommended.    09/26/2014 Mammogram Spot compression views of the left breast and routine views of both breasts demonstrate a 2 x 2.5 cm irregular mass in the upper inner left breast.    09/29/2014 Initial Biopsy 1. Breast, left, needle core biopsy, mass, 11 o'clock - INVASIVE MAMMARY CARCINOMA, SEE COMMENT. - MAMMARY CARCINOMA IN SITU. 2. Lymph node, needle/core biopsy, left axillary - ONE LYMPH NODE, POSITIVE FOR METASTATIC MAMMARY CARCINOMA (1/1).   09/29/2014 Receptors her2 Estrogen Receptor: 0%, NEGATIVE Progesterone Receptor: 0%, NEGATIVE Proliferation Marker Ki67: 93%   10/01/2014 Initial Diagnosis Breast cancer of upper-inner quadrant of left female breast   10/17/2014 Imaging CT and bone scan negative for distant metastasis    10/24/2014 -  Adjuvant Chemotherapy ddACx4 then weekly Taxol X12     HISTORY OF PRESENTING ILLNESS:  Ashlee Hill 54  y.o. female presents to our multidisciplinary breast clinic today to discuss the management of her newly diagnosed breast cancer  She noticed a left breast lump in August 2015, no tenderness, skin or nipple change. She otherwise felt well. She did not seek immediate medical attention due to lack of insurance. She finally got her insurance approved and saw her primary care physician recently. She was referred for mammogram which showed a 2.5 cm irregular mass in the upper inner left breast. She underwent left breast mass and axillary node biopsy on 09/29/2014 and both biopsy showed invasive ductal carcinoma, ER negative, PR negative, HER-2 negative.  She otherwise feels well, no pain or ther complains. She has good appetite and her weight is stable.  CURRENT THERAPY: Neoadjuvant chemotherapy with ddAC every 2 weeks x4, followed by weekly Taxol 12, started on 10/24/2058, Taxol starting on 12/26/14  INTERIM HISTORY; Jerrie returns for follow-up and first cycle of Taxol. Her fatigue and anorexia has significant improved this week. She feels well overall in the past few days. She was seen by her primary care physician Dr. Delfina Redwood, who started her on short acting insulin and stopped her metformin. She has been checking her sugars at home 3 times a day, and blood sugar is usually in the range of 200. She's going to the diabetic education postural. No other new complaints.  MEDICAL HISTORY:  Past Medical History  Diagnosis Date  . Breast cancer of upper-inner quadrant of left female breast 10/01/2014  . Hypertension   . Family history of breast cancer   . Wears glasses  driving  . Diabetes mellitus without complication 5/80/99    chemo caused diabetes per pt     SURGICAL HISTORY: Past Surgical History  Procedure Laterality Date  . Tonsillectomy    . Wisdom tooth extraction    . Portacath placement Right 10/21/2014    Procedure: INSERTION PORT-A-CATH;  Surgeon: Erroll Luna, MD;  Location:  [place];  Service: General;  Laterality: Right;     GYN HISTORY  Menarchal: 11 LMP: 09/27/2014 Contraceptive: no HRT:  G0P0    SOCIAL HISTORY: History   Social History  . Marital Status: Single    Spouse Name: N/A  . Number of Children: N/A  . Years of Education: N/A   Occupational History  . Not on file.   Social History Main Topics  . Smoking status: Never Smoker   . Smokeless tobacco: Never Used  . Alcohol Use: 0.6 oz/week    1 Glasses of wine per week     Comment: socail drinker  . Drug Use: No  . Sexual Activity: Not on file   Other Topics Concern  . Not on file   Social History Narrative    FAMILY HISTORY: Family History  Problem Relation Age of Onset  . Prostate cancer Father 62    currently 65  . Breast cancer Paternal Aunt 56    deceased 7  . Lung cancer Maternal Grandfather 48    smoker; deceased  . Thyroid cancer Cousin 63    pat first cousin; currently 71     Father had prostate cancer at age of 1 Paternal aunt had breast caner in her 53's Paternal cousin had thyroid cancer at age of before 72 Maternal grandfather had lung cancer    ALLERGIES:  is allergic to tegaderm ag mesh.  MEDICATIONS:  Current Outpatient Prescriptions  Medication Sig Dispense Refill  . ALPRAZolam (XANAX) 1 MG tablet Take 1-2 mg by mouth 2 (two) times daily. Take 1 tablet (1 mg) in the morning & Take 2 tablets (2 mg) in the evening.    . Alum & Mag Hydroxide-Simeth (MAGIC MOUTHWASH W/LIDOCAINE) SOLN Take 5 mLs by mouth 4 (four) times daily as needed for mouth pain. 120 mL 1  . aspirin 81 MG tablet Take 81 mg by mouth daily.    Marland Kitchen atorvastatin (LIPITOR) 10 MG tablet Take 10 mg by mouth daily.    . calcium carbonate (OS-CAL) 600 MG TABS tablet Take 600 mg by mouth daily with breakfast.    . dexamethasone (DECADRON) 4 MG tablet 1/2 tablet (2 mg) Q AM for nausea PRN. (Patient not taking: Reported on 12/04/2014) 10 tablet 0  . gabapentin (NEURONTIN) 600  MG tablet Take 600 mg by mouth at bedtime. As needed for sleep or anxiety    . hydrochlorothiazide (HYDRODIURIL) 25 MG tablet Take 25 mg by mouth daily.     Marland Kitchen lidocaine-prilocaine (EMLA) cream Apply to affected area once 30 g 3  . LORazepam (ATIVAN) 1 MG tablet Take 1 tablet (1 mg total) by mouth at bedtime as needed for sleep. 30 tablet 0  . meclizine (ANTIVERT) 25 MG tablet Take 25 mg by mouth 3 (three) times daily as needed for dizziness.    . menthol-cetylpyridinium (CEPACOL) 3 MG lozenge Take 1 lozenge by mouth every 2 (two) hours as needed for sore throat (sore throat).    . metFORMIN (GLUCOPHAGE) 1000 MG tablet Take 1 tablet (1,000 mg total) by mouth 2 (two) times daily with a meal. 60 tablet 2  .  metoprolol (LOPRESSOR) 100 MG tablet Take 100 mg by mouth daily.    . ondansetron (ZOFRAN) 8 MG tablet Take 1 tablet (8 mg total) by mouth every 8 (eight) hours as needed for nausea. Start on the third day after chemotherapy. 30 tablet 1  . oxyCODONE-acetaminophen (ROXICET) 5-325 MG per tablet Take 1-2 tablets by mouth every 4 (four) hours as needed. (Patient not taking: Reported on 12/04/2014) 30 tablet 0  . PRESCRIPTION MEDICATION Chemo [facility]    . prochlorperazine (COMPAZINE) 10 MG tablet Take 1 tablet (10 mg total) by mouth every 6 (six) hours as needed for nausea or vomiting. 30 tablet 1  . venlafaxine XR (EFFEXOR-XR) 150 MG 24 hr capsule Take 150 mg by mouth daily with breakfast.     No current facility-administered medications for this visit.    REVIEW OF SYSTEMS:   Constitutional: Denies fevers, chills or abnormal night sweats Eyes: Denies blurriness of vision, double vision or watery eyes Ears, nose, mouth, throat, and face: resolving mucositis and sore throat Respiratory: Denies cough, dyspnea or wheezes Cardiovascular: Denies palpitation, chest discomfort or lower extremity swelling Gastrointestinal:  Denies nausea, heartburn or change in bowel habits Skin: Denies abnormal skin  rashes Lymphatics: Denies new lymphadenopathy or easy bruising Neurological:Denies numbness, tingling or new weaknesses Behavioral/Psych: Mood is stable, no new changes  All other systems were reviewed with the patient and are negative.  PHYSICAL EXAMINATION: ECOG PERFORMANCE STATUS: 0 - Asymptomatic  Filed Vitals:   12/26/14 0949  BP: 159/85  Pulse: 109  Temp: 98 F (36.7 C)  Resp: 20   Filed Weights   12/26/14 0949  Weight: 225 lb 8 oz (102.286 kg)    GENERAL:alert, no distress and comfortable SKIN: skin color, texture, turgor are normal, no rashes or significant lesions. Port site looks clean, (+) skin peeling on the bottom of left feet EYES: normal, conjunctiva are pink and non-injected, sclera clear OROPHARYNX:no exudate, no erythema and lips, buccal mucosa, and tongue normal  NECK: supple, thyroid normal size, non-tender, without nodularity LYMPH:  no palpable lymphadenopathy in the cervical, axillary or inguinal LUNGS: clear to auscultation and percussion with normal breathing effort HEART: regular rate & rhythm and no murmurs and no lower extremity edema ABDOMEN:abdomen soft, non-tender and normal bowel sounds Musculoskeletal:no cyanosis of digits and no clubbing  PSYCH: alert & oriented x 3 with fluent speech NEURO: no focal motor/sensory deficits Breasts: Breast inspection showed them to be symmetrical with no nipple discharge. Palpation of the left breasts showed a <1cm residual small mass in the upper mid quadrant (smaller than before), no palpable axillary lymph node. Exam of the right breast and axillary was normal.    LABORATORY DATA:  I have reviewed the data as listed CBC Latest Ref Rng 12/26/2014 12/19/2014 12/12/2014  WBC 3.9 - 10.3 10e3/uL 4.1 1.5(L) 6.5  Hemoglobin 11.6 - 15.9 g/dL 8.7(L) 9.5(L) 11.0(L)  Hematocrit 34.8 - 46.6 % 25.5(L) 27.8(L) 33.6(L)  Platelets 145 - 400 10e3/uL 122(L) 99(L) 215     Recent Labs  12/04/14 1134 12/04/14 1820  12/05/14 0520 12/06/14 0530 12/12/14 1350 12/19/14 0859 12/26/14 0935  NA 129*  --  138 138 138 134* 138  K 3.6  --  3.3* 4.1 4.4 4.4 3.9  CL 91*  --  103 106  --   --   --   CO2 25  --  27 24 20* 23 22  GLUCOSE 422*  --  227* 204* 185* 187* 236*  BUN 10  --  7 <5* 10.0 18.1 19.3  CREATININE 1.27*  --  1.03 0.79 0.9 1.1 1.0  CALCIUM 8.8  --  8.1* 7.7* 9.4 9.1 8.8  GFRNONAA 48*  --  61* >90  --   --   --   GFRAA 55*  --  71* >90  --   --   --   PROT 7.5  --  5.9*  --  7.1 7.0 7.2  ALBUMIN 4.5  --  3.3*  --  3.9 3.8 4.0  AST 36  --  28  --  41* 20 22  ALT 28  --  22  --  37 22 24  ALKPHOS 92  --  62  --  118 95 163*  BILITOT 1.3* 0.9 0.8  --  0.65 0.74 0.60  BILIDIR  --  0.2  --   --   --   --   --   IBILI  --  0.7  --   --   --   --   --     PATHOLOGY REPORT 09/26/2014 Diagnosis 1. Breast, left, needle core biopsy, mass, 11 o'clock - INVASIVE MAMMARY CARCINOMA, SEE COMMENT. - MAMMARY CARCINOMA IN SITU. 2. Lymph node, needle/core biopsy, left axillary - ONE LYMPH NODE, POSITIVE FOR METASTATIC MAMMARY CARCINOMA (1/1). Microscopic Comment 1. Although grade of tumor is best assessed at resection, with these biopsies, both the in situ and invasive carcinoma are grade II. The invasive carcinoma demonstrates strong diffuse E-cadherin expression; supporting a ductal phenotype. 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  Estrogen Receptor: 0%, NEGATIVE Progesterone Receptor: 0%, NEGATIVE Proliferation Marker Ki67: 93% 1. A sample was sent to [facility] for HER-2 testing by FISH. The results are as follows: Negative.  RADIOGRAPHIC STUDIES: I have personally reviewed the radiological images as listed and agreed with the findings in the report.  Mr Breast Bilateral W Wo Contrast 10/08/2014    IMPRESSION: 1. Biopsy proven malignancy in the upper  inner left breast with adjacent/contiguous areas of nodularity measures up to 3.5 cm, with multiple morphologically abnormal axillary lymph nodes compatible with known axillary metastases.  2. No MRI evidence of malignancy in the right breast.  RECOMMENDATION: Treatment plan for known left breast malignancy.  BI-RADS CATEGORY  6: Known biopsy-proven malignancy.   Electronically Signed   By: Everlean Alstrom M.D.   On: 10/08/2014 10:29   US Breast Ltd Uni Left Inc Axilla 09/26/2014    IMPRESSION: 2.5 cm irregular mass in the upper inner left breast with adjacent 1.1 cm satellite mass and enlarged left axillary lymph nodes, highly suspicious for left breast malignancy and left axillary lymph node metastases. Tissue sampling is recommended.  No mammographic evidence of right breast malignancy.    CT chest, abdomen and pelvis with contrast 10/17/2014 IMPRESSION: 1. Left breast lesion is identified compatible with the clinical history of breast cancer. 2. Enlarged left axillary lymph nodes suspicious for metastatic adenopathy. 3. No specific features identified to suggest distant metastatic disease. 4. Hepatic steatosis.  Bone scan 10/17/2014 IMPRESSION: 1. The uptake pattern within the skeleton is not highly suspicious for malignancy. However, subtle increased uptake in the midshaft of the right humerus and the lower lumbar spine posteriorly merits further evaluation with plain films. 2. The uptake over the lower extremities is consistent with degenerative change.  Lumbar spine X-ray 10/20/14 IMPRESSION: No lytic or sclerotic osseous lesion.  Probable  degenerative facet osteoarthritic change at L4-L5 and L5-S1 which may correspond to the area of abnormal uptake at recent bone scan.  RIGHT HUMERUS X-RAY 10/20/2014 IMPRESSION: No abnormality seen to correspond to abnormal uptake seen on bone scan   ASSESSMENT & PLAN:  54 year old pre-menopausal female with past medical history of  hypertension, presented with a palpable left breast mass.  1. Left breast invasive ductal carcinoma, cT2N1M0, stage IIB, triple negative  -I discussed her all imaging finding and biopsy results extensively with patient. Her ultrasound and MRI breast reviewed multiple left axillary lymph nodes, at least N1 disease, possible N2. I discussed with her and she has locally advanced disease, and triple-negative breast cancer tends to be more aggressive with early metastasis and cancer recurrence after surgery. -a staging CT showed no evidence of distant metastasis, the subtle increased uptake in the right humerus and lower lumbar spine on the bone scan was further evaluated by x-ray, which were negative. - I recommend neoadjuvant chemotherapy given her very high risk of cancer recurrence after surgery alone. -She has no history of cardiac disease, normal EF on echo,  I would recommend dose dense Adriamycin and Cytoxan followed by paclitaxel.  -Given her young age and triple negative disease, positive family history, she was referred to see genetic counselor for genetic testing, her genetic testing results are still pending. -If she has positive BRCA1/2 mutation, she will be a good candidate for the NSABP B-55 clinical trial. -Lab reviewed, adequate for treatment, we'll start weekly paclitaxel today.  2. Infertility -We discussed she may experience menopause with chemotherapy, the potentially not able to be pregnant afterwards. -She does not desire to have children in the future. -She has not had menstrual periods since she started chemotherapy  3.Anxiety and coping -she will take her xanax needed -I encouraged her to think positive -She has good support from her parents and friend.   4. Nausea, vomiting, anorexia -The second to chemotherapy -She knows to take Compazine and Zofran as needed -I encouraged her to eat small female but for more frequent, and take nutrition supplement  5. Newly diagnosed  type 2 diabetes -She was seen by primary care physician Dr. Delfina Redwood last week and switch to her from metformin to short acting insulin. -She may need lantus, she can see Dr. Delfina Redwood next week -Continue to check blood glucose 2-3 times a day -I'll likely need to cut down her dexamethasone before Taxol, steroids induced hyperglycemia discussed.  6. Cytopenia secondary to chemo -She received a blood transfusion after the third cycle AC -Hemoglobin being 8.7 today, she feels well, we'll continue monitoring.  Plan -start weekly Taxol today, decrease premeds dexamethasone to 10 mg  -Follow up with Dr. Delfina Redwood next week, to see if you need Lantus -Return to clinic in 1 week. If she is doing well with weekly Taxol, I'll see her every 2 weeks afterwards.  All questions were answered. The patient knows to call the clinic with any problems, questions or concerns. I spent 20 minutes counseling the patient face to face. The total time spent in the appointment was 25 minutes and more than 50% was on counseling.     Truitt Merle, MD 12/26/2014 10:43 AM

## 2014-12-26 NOTE — Patient Instructions (Signed)
[facility] Discharge Instructions for Patients Receiving Chemotherapy  Today you received the following chemotherapy agents:  Taxol  To help prevent nausea and vomiting after your treatment, we encourage you to take your nausea medication as prescribed.   If you develop nausea and vomiting that is not controlled by your nausea medication, call the clinic.   BELOW ARE SYMPTOMS THAT SHOULD BE REPORTED IMMEDIATELY:  *FEVER GREATER THAN 100.5 F  *CHILLS WITH OR WITHOUT FEVER  NAUSEA AND VOMITING THAT IS NOT CONTROLLED WITH YOUR NAUSEA MEDICATION  *UNUSUAL SHORTNESS OF BREATH  *UNUSUAL BRUISING OR BLEEDING  TENDERNESS IN MOUTH AND THROAT WITH OR WITHOUT PRESENCE OF ULCERS  *URINARY PROBLEMS  *BOWEL PROBLEMS  UNUSUAL RASH Items with * indicate a potential emergency and should be followed up as soon as possible.  Feel free to call the clinic you have any questions or concerns. The clinic phone number is [phone].  Please show the CHEMO ALERT CARD at check-in to the Emergency Department and triage nurse.   

## 2014-12-30 ENCOUNTER — Telehealth: Payer: Self-pay

## 2014-12-30 NOTE — Telephone Encounter (Signed)
-----   Message from Arna Snipe, RN sent at 12/26/2014  3:07 PM EDT ----- Regarding: Dr. Burr Medico  !st time Chemo call back Contact: [phone] 1st time Taxol. Patient was anxious. The area around her port where the Emla cream was applied turned bright red. She was concerned about that as well. Patient states that day 3 after her last chemo tx plan was the worst in regards to nausea.

## 2014-12-30 NOTE — Telephone Encounter (Signed)
Pt has some mucositis and some muscle aches. Uses halls lozenges. Discussed baking soda/salt rinses. Used tylenol for muscle aches that helped. The steroids did effect her BS and she is working with her PCP for the diabetes. Denies nausea.

## 2015-01-02 ENCOUNTER — Telehealth: Payer: Self-pay | Admitting: Hematology

## 2015-01-02 ENCOUNTER — Other Ambulatory Visit: Payer: 59

## 2015-01-02 ENCOUNTER — Ambulatory Visit (HOSPITAL_BASED_OUTPATIENT_CLINIC_OR_DEPARTMENT_OTHER): Payer: 59 | Admitting: Nurse Practitioner

## 2015-01-02 ENCOUNTER — Ambulatory Visit (HOSPITAL_BASED_OUTPATIENT_CLINIC_OR_DEPARTMENT_OTHER): Payer: 59

## 2015-01-02 ENCOUNTER — Other Ambulatory Visit (HOSPITAL_BASED_OUTPATIENT_CLINIC_OR_DEPARTMENT_OTHER): Payer: 59

## 2015-01-02 VITALS — BP 144/79 | HR 94 | Temp 98.0°F | Resp 19 | Ht 65.0 in | Wt 228.6 lb

## 2015-01-02 DIAGNOSIS — D649 Anemia, unspecified: Secondary | ICD-10-CM

## 2015-01-02 DIAGNOSIS — Z171 Estrogen receptor negative status [ER-]: Secondary | ICD-10-CM | POA: Diagnosis not present

## 2015-01-02 DIAGNOSIS — C50212 Malignant neoplasm of upper-inner quadrant of left female breast: Secondary | ICD-10-CM

## 2015-01-02 DIAGNOSIS — C773 Secondary and unspecified malignant neoplasm of axilla and upper limb lymph nodes: Secondary | ICD-10-CM | POA: Diagnosis not present

## 2015-01-02 DIAGNOSIS — Z5111 Encounter for antineoplastic chemotherapy: Secondary | ICD-10-CM

## 2015-01-02 LAB — CBC WITH DIFFERENTIAL/PLATELET
BASO%: 1.1 % (ref 0.0–2.0)
Basophils Absolute: 0.1 10*3/uL (ref 0.0–0.1)
EOS%: 0.9 % (ref 0.0–7.0)
Eosinophils Absolute: 0 10*3/uL (ref 0.0–0.5)
HCT: 21.8 % — ABNORMAL LOW (ref 34.8–46.6)
HGB: 7.4 g/dL — ABNORMAL LOW (ref 11.6–15.9)
LYMPH%: 18.7 % (ref 14.0–49.7)
MCH: 35.1 pg — ABNORMAL HIGH (ref 25.1–34.0)
MCHC: 33.9 g/dL (ref 31.5–36.0)
MCV: 103.3 fL — AB (ref 79.5–101.0)
MONO#: 0.3 10*3/uL (ref 0.1–0.9)
MONO%: 7.1 % (ref 0.0–14.0)
NEUT#: 3.4 10*3/uL (ref 1.5–6.5)
NEUT%: 72.2 % (ref 38.4–76.8)
Platelets: 159 10*3/uL (ref 145–400)
RBC: 2.11 10*6/uL — ABNORMAL LOW (ref 3.70–5.45)
RDW: 18.8 % — ABNORMAL HIGH (ref 11.2–14.5)
WBC: 4.7 10*3/uL (ref 3.9–10.3)
lymph#: 0.9 10*3/uL (ref 0.9–3.3)

## 2015-01-02 LAB — COMPREHENSIVE METABOLIC PANEL (CC13)
ALBUMIN: 3.6 g/dL (ref 3.5–5.0)
ALT: 23 U/L (ref 0–55)
AST: 24 U/L (ref 5–34)
Alkaline Phosphatase: 102 U/L (ref 40–150)
Anion Gap: 11 mEq/L (ref 3–11)
BUN: 12.4 mg/dL (ref 7.0–26.0)
CO2: 25 meq/L (ref 22–29)
Calcium: 9.2 mg/dL (ref 8.4–10.4)
Chloride: 101 mEq/L (ref 98–109)
Creatinine: 1 mg/dL (ref 0.6–1.1)
EGFR: 64 mL/min/{1.73_m2} — AB (ref >90)
GLUCOSE: 194 mg/dL — AB (ref 70–140)
Potassium: 4.4 mEq/L (ref 3.5–5.1)
SODIUM: 138 meq/L (ref 136–145)
Total Bilirubin: 0.73 mg/dL (ref 0.20–1.20)
Total Protein: 6.7 g/dL (ref 6.4–8.3)

## 2015-01-02 MED ORDER — HEPARIN SOD (PORK) LOCK FLUSH 100 UNIT/ML IV SOLN
500.0000 [IU] | Freq: Once | INTRAVENOUS | Status: AC | PRN
  Administered 2015-01-02: 500 [IU]
  Filled 2015-01-02: qty 5

## 2015-01-02 MED ORDER — PACLITAXEL CHEMO INJECTION 300 MG/50ML
80.0000 mg/m2 | Freq: Once | INTRAVENOUS | Status: AC
  Administered 2015-01-02: 174 mg via INTRAVENOUS
  Filled 2015-01-02: qty 29

## 2015-01-02 MED ORDER — FAMOTIDINE IN NACL 20-0.9 MG/50ML-% IV SOLN
INTRAVENOUS | Status: AC
  Filled 2015-01-02: qty 50

## 2015-01-02 MED ORDER — DIPHENHYDRAMINE HCL 50 MG/ML IJ SOLN
50.0000 mg | Freq: Once | INTRAMUSCULAR | Status: AC
  Administered 2015-01-02: 50 mg via INTRAVENOUS

## 2015-01-02 MED ORDER — DIPHENHYDRAMINE HCL 50 MG/ML IJ SOLN
INTRAMUSCULAR | Status: AC
  Filled 2015-01-02: qty 1

## 2015-01-02 MED ORDER — FAMOTIDINE IN NACL 20-0.9 MG/50ML-% IV SOLN
20.0000 mg | Freq: Once | INTRAVENOUS | Status: AC
  Administered 2015-01-02: 20 mg via INTRAVENOUS

## 2015-01-02 MED ORDER — SODIUM CHLORIDE 0.9 % IJ SOLN
10.0000 mL | INTRAMUSCULAR | Status: DC | PRN
  Administered 2015-01-02: 10 mL
  Filled 2015-01-02: qty 10

## 2015-01-02 MED ORDER — SODIUM CHLORIDE 0.9 % IV SOLN
Freq: Once | INTRAVENOUS | Status: AC
  Administered 2015-01-02: 12:00:00 via INTRAVENOUS
  Filled 2015-01-02: qty 4

## 2015-01-02 MED ORDER — SODIUM CHLORIDE 0.9 % IV SOLN
Freq: Once | INTRAVENOUS | Status: AC
  Administered 2015-01-02: 12:00:00 via INTRAVENOUS

## 2015-01-02 NOTE — Telephone Encounter (Signed)
per Dr Burr Medico email to move pt appt to [facility]-no need to contact pt appt @ same time

## 2015-01-02 NOTE — Progress Notes (Signed)
[place] OFFICE PROGRESS NOTE   Diagnosis:  Breast cancer Oncology History   Breast cancer of upper-inner quadrant of left female breast  Staging form: Breast, AJCC 7th Edition  Clinical stage from 10/08/2014: Stage IIB (T2, N1, M0) - Unsigned  Pathologic: Stage IIB (T2, N1, cM0) - Unsigned       Breast cancer of upper-inner quadrant of left female breast   09/26/2014 Breast US 2.5 cm irregular mass in the upper inner left breast with adjacent 1.1 cm satellite mass and enlarged left axillary lymph nodes, highly suspicious for left breast malignancy and left axillary lymph node metastases. Tissue sampling is recommended.    09/26/2014 Mammogram Spot compression views of the left breast and routine views of both breasts demonstrate a 2 x 2.5 cm irregular mass in the upper inner left breast.    09/29/2014 Initial Biopsy 1. Breast, left, needle core biopsy, mass, 11 o'clock - INVASIVE MAMMARY CARCINOMA, SEE COMMENT. - MAMMARY CARCINOMA IN SITU. 2. Lymph node, needle/core biopsy, left axillary - ONE LYMPH NODE, POSITIVE FOR METASTATIC MAMMARY CARCINOMA (1/1).   09/29/2014 Receptors her2 Estrogen Receptor: 0%, NEGATIVE Progesterone Receptor: 0%, NEGATIVE Proliferation Marker Ki67: 93%   10/01/2014 Initial Diagnosis Breast cancer of upper-inner quadrant of left female breast   10/17/2014 Imaging CT and bone scan negative for distant metastasis    10/24/2014 -  Adjuvant Chemotherapy ddACx4 then weekly Taxol X12          INTERVAL HISTORY:   Ashlee Hill returns as scheduled. She completed the first of 12 weekly Taxol treatments on 12/26/2014. She has mild nausea. Her mouth was "sore". She did not develop any ulcers. She became constipated following the chemotherapy. She is now on a laxative regimen. No numbness or tingling in her hands or feet. She had some muscle aches in her legs for 1 afternoon. She took Tylenol with improvement. She denies any  bleeding. No shortness of breath or chest pain. She reports that her blood sugar increased to greater than 500 following treatment last week.  Objective:  Vital signs in last 24 hours:  Blood pressure 144/79, pulse 94, temperature 98 F (36.7 C), temperature source Oral, resp. rate 19, height _0  (1.651 m), weight 228 lb 9.6 oz (103.692 kg), SpO2 98 %.    HEENT: No thrush or ulcers. Lymphatics: No palpable left axillary adenopathy. Resp: Lungs clear bilaterally. Cardio: Regular rate and rhythm. GI: Abdomen soft and nontender. No hepatomegaly. Vascular: No leg edema.  Skin: Peeling skin soles of both feet. Breast: Question tiny residual mass upper mid quadrant left breast. Port-A-Cath without erythema.    Lab Results:  Lab Results  Component Value Date   WBC 4.7 01/02/2015   HGB 7.4* 01/02/2015   HCT 21.8* 01/02/2015   MCV 103.3* 01/02/2015   PLT 159 01/02/2015   NEUTROABS 3.4 01/02/2015    Imaging:  No results found.  Medications: I have reviewed the patient's current medications.  Assessment/Plan: 1. Left breast invasive ductal carcinoma, cT2N1M0, stage IIB, triple negative diagnosed 09/29/2014. Neoadjuvant dose dense Adriamycin/Cytoxan initiated 10/24/2014 with 4 cycles planned to be followed by Taxol weekly 12. Cycle 4 dose dense Adriamycin/Cytoxan completed 12/12/2014. Weekly Taxol initiated 12/26/2014. 2. History of mucositis related to chemotherapy. 3. Question hand-foot syndrome following cycle 3 Adriamycin/Cytoxan. Improved. 4. Hyperglycemia. She was recently hospitalized with a blood sugar of greater than 400. Now on insulin. 5. Hospitalization 12/04/2014 through 12/06/2014 with nausea/vomiting/dehydration and hyperglycemia. 6. Anemia. Progressive.    Disposition: Ashlee Hill  appears stable. Plan to proceed with cycle 2 weekly Taxol today as scheduled. We will decrease the dexamethasone premedication to 5 mg due to marked hyperglycemia following treatment  last week. She continues close follow-up with her PCP regarding the hyperglycemia.  She has progressive anemia which is most likely related to chemotherapy. She is asymptomatic. I reviewed her labs with Dr. Burr Medico. We will arrange for a blood transfusion next week. Ashlee Hill understands to contact the office prior to that appointment if she develops signs/symptoms suggestive of progressive anemia.  She will return for a follow-up visit and cycle 3 Taxol in one week. She will contact the office in the interim as outlined above or with any other problems.  Plan reviewed with Dr. Burr Medico.       Ned Card ANP/GNP-BC   01/02/2015  11:31 AM

## 2015-01-02 NOTE — Patient Instructions (Signed)
[facility] Discharge Instructions for Patients Receiving Chemotherapy  Today you received the following chemotherapy agents Taxol   To help prevent nausea and vomiting after your treatment, we encourage you to take your nausea medication as directed.   If you develop nausea and vomiting that is not controlled by your nausea medication, call the clinic.   BELOW ARE SYMPTOMS THAT SHOULD BE REPORTED IMMEDIATELY:  *FEVER GREATER THAN 100.5 F  *CHILLS WITH OR WITHOUT FEVER  NAUSEA AND VOMITING THAT IS NOT CONTROLLED WITH YOUR NAUSEA MEDICATION  *UNUSUAL SHORTNESS OF BREATH  *UNUSUAL BRUISING OR BLEEDING  TENDERNESS IN MOUTH AND THROAT WITH OR WITHOUT PRESENCE OF ULCERS  *URINARY PROBLEMS  *BOWEL PROBLEMS  UNUSUAL RASH Items with * indicate a potential emergency and should be followed up as soon as possible.  Feel free to call the clinic you have any questions or concerns. The clinic phone number is [phone].  Please show the CHEMO ALERT CARD at check-in to the Emergency Department and triage nurse.   

## 2015-01-02 NOTE — Progress Notes (Signed)
Per Ned Card, NP okay to proceed with treatment today with HGB: 7.4

## 2015-01-02 NOTE — Telephone Encounter (Signed)
Gave and printed appt sched for May

## 2015-01-07 ENCOUNTER — Other Ambulatory Visit (HOSPITAL_BASED_OUTPATIENT_CLINIC_OR_DEPARTMENT_OTHER): Payer: 59

## 2015-01-07 ENCOUNTER — Ambulatory Visit (HOSPITAL_BASED_OUTPATIENT_CLINIC_OR_DEPARTMENT_OTHER): Payer: 59

## 2015-01-07 ENCOUNTER — Ambulatory Visit (HOSPITAL_COMMUNITY)
Admission: RE | Admit: 2015-01-07 | Discharge: 2015-01-07 | Disposition: A | Discharge status: Home / Self Care | Payer: 59 | Source: Ambulatory Visit | Attending: Hematology | Admitting: Hematology

## 2015-01-07 VITALS — BP 119/76 | HR 85 | Temp 98.6°F | Resp 18

## 2015-01-07 DIAGNOSIS — C50212 Malignant neoplasm of upper-inner quadrant of left female breast: Secondary | ICD-10-CM | POA: Diagnosis not present

## 2015-01-07 DIAGNOSIS — D649 Anemia, unspecified: Secondary | ICD-10-CM

## 2015-01-07 LAB — COMPREHENSIVE METABOLIC PANEL (CC13)
ALT: 35 U/L (ref 0–55)
ANION GAP: 14 meq/L — AB (ref 3–11)
AST: 39 U/L — AB (ref 5–34)
Albumin: 3.7 g/dL (ref 3.5–5.0)
Alkaline Phosphatase: 92 U/L (ref 40–150)
BUN: 16.4 mg/dL (ref 7.0–26.0)
CO2: 23 meq/L (ref 22–29)
Calcium: 8.9 mg/dL (ref 8.4–10.4)
Chloride: 101 mEq/L (ref 98–109)
Creatinine: 1 mg/dL (ref 0.6–1.1)
EGFR: 67 mL/min/{1.73_m2} — AB (ref >90)
Glucose: 198 mg/dl — ABNORMAL HIGH (ref 70–140)
POTASSIUM: 4 meq/L (ref 3.5–5.1)
Sodium: 138 mEq/L (ref 136–145)
TOTAL PROTEIN: 6.8 g/dL (ref 6.4–8.3)
Total Bilirubin: 1.07 mg/dL (ref 0.20–1.20)

## 2015-01-07 LAB — CBC WITH DIFFERENTIAL/PLATELET
BASO%: 0 % (ref 0.0–2.0)
Basophils Absolute: 0 10*3/uL (ref 0.0–0.1)
EOS%: 0.5 % (ref 0.0–7.0)
Eosinophils Absolute: 0 10*3/uL (ref 0.0–0.5)
HCT: 19 % — ABNORMAL LOW (ref 34.8–46.6)
HGB: 6.6 g/dL — CL (ref 11.6–15.9)
LYMPH#: 0.3 10*3/uL — AB (ref 0.9–3.3)
LYMPH%: 14 % (ref 14.0–49.7)
MCH: 35.9 pg — ABNORMAL HIGH (ref 25.1–34.0)
MCHC: 34.7 g/dL (ref 31.5–36.0)
MCV: 103.3 fL — AB (ref 79.5–101.0)
MONO#: 0.1 10*3/uL (ref 0.1–0.9)
MONO%: 3.9 % (ref 0.0–14.0)
NEUT#: 1.7 10*3/uL (ref 1.5–6.5)
NEUT%: 81.6 % — AB (ref 38.4–76.8)
PLATELETS: 111 10*3/uL — AB (ref 145–400)
RBC: 1.84 10*6/uL — ABNORMAL LOW (ref 3.70–5.45)
RDW: 19 % — ABNORMAL HIGH (ref 11.2–14.5)
WBC: 2.1 10*3/uL — ABNORMAL LOW (ref 3.9–10.3)

## 2015-01-07 LAB — PREPARE RBC (CROSSMATCH)

## 2015-01-07 LAB — HOLD TUBE, BLOOD BANK

## 2015-01-07 MED ORDER — SODIUM CHLORIDE 0.9 % IJ SOLN
3.0000 mL | INTRAMUSCULAR | Status: DC | PRN
  Filled 2015-01-07: qty 10

## 2015-01-07 MED ORDER — SODIUM CHLORIDE 0.9 % IV SOLN
250.0000 mL | Freq: Once | INTRAVENOUS | Status: AC
  Administered 2015-01-07: 250 mL via INTRAVENOUS

## 2015-01-07 MED ORDER — SODIUM CHLORIDE 0.9 % IJ SOLN
10.0000 mL | INTRAMUSCULAR | Status: AC | PRN
  Administered 2015-01-07: 10 mL
  Filled 2015-01-07: qty 10

## 2015-01-07 MED ORDER — HEPARIN SOD (PORK) LOCK FLUSH 100 UNIT/ML IV SOLN
500.0000 [IU] | Freq: Every day | INTRAVENOUS | Status: AC | PRN
  Administered 2015-01-07: 500 [IU]
  Filled 2015-01-07: qty 5

## 2015-01-07 NOTE — Patient Instructions (Signed)

## 2015-01-08 LAB — TYPE AND SCREEN
ABO/RH(D): O NEG
Antibody Screen: NEGATIVE
Unit division: 0
Unit division: 0

## 2015-01-09 ENCOUNTER — Other Ambulatory Visit: Payer: 59

## 2015-01-09 ENCOUNTER — Ambulatory Visit (HOSPITAL_BASED_OUTPATIENT_CLINIC_OR_DEPARTMENT_OTHER): Payer: 59 | Admitting: Nurse Practitioner

## 2015-01-09 ENCOUNTER — Other Ambulatory Visit (HOSPITAL_BASED_OUTPATIENT_CLINIC_OR_DEPARTMENT_OTHER): Payer: 59

## 2015-01-09 ENCOUNTER — Encounter: Payer: Self-pay | Admitting: General Practice

## 2015-01-09 ENCOUNTER — Ambulatory Visit (HOSPITAL_BASED_OUTPATIENT_CLINIC_OR_DEPARTMENT_OTHER): Payer: 59

## 2015-01-09 ENCOUNTER — Ambulatory Visit: Payer: 59 | Admitting: Hematology

## 2015-01-09 VITALS — BP 138/87 | HR 110 | Temp 98.2°F | Resp 20 | Ht 65.0 in | Wt 227.3 lb

## 2015-01-09 DIAGNOSIS — C50212 Malignant neoplasm of upper-inner quadrant of left female breast: Secondary | ICD-10-CM

## 2015-01-09 DIAGNOSIS — D649 Anemia, unspecified: Secondary | ICD-10-CM

## 2015-01-09 DIAGNOSIS — Z171 Estrogen receptor negative status [ER-]: Secondary | ICD-10-CM

## 2015-01-09 DIAGNOSIS — Z5111 Encounter for antineoplastic chemotherapy: Secondary | ICD-10-CM

## 2015-01-09 DIAGNOSIS — R739 Hyperglycemia, unspecified: Secondary | ICD-10-CM

## 2015-01-09 DIAGNOSIS — C773 Secondary and unspecified malignant neoplasm of axilla and upper limb lymph nodes: Secondary | ICD-10-CM | POA: Diagnosis not present

## 2015-01-09 LAB — CBC WITH DIFFERENTIAL/PLATELET
BASO%: 0.7 % (ref 0.0–2.0)
Basophils Absolute: 0 10*3/uL (ref 0.0–0.1)
EOS%: 1.1 % (ref 0.0–7.0)
Eosinophils Absolute: 0 10*3/uL (ref 0.0–0.5)
HCT: 26.1 % — ABNORMAL LOW (ref 34.8–46.6)
HGB: 8.8 g/dL — ABNORMAL LOW (ref 11.6–15.9)
LYMPH%: 20.5 % (ref 14.0–49.7)
MCH: 32.6 pg (ref 25.1–34.0)
MCHC: 33.7 g/dL (ref 31.5–36.0)
MCV: 96.7 fL (ref 79.5–101.0)
MONO#: 0.2 10*3/uL (ref 0.1–0.9)
MONO%: 8.1 % (ref 0.0–14.0)
NEUT#: 2 10*3/uL (ref 1.5–6.5)
NEUT%: 69.6 % (ref 38.4–76.8)
Platelets: 136 10*3/uL — ABNORMAL LOW (ref 145–400)
RBC: 2.7 10*6/uL — AB (ref 3.70–5.45)
RDW: 21.9 % — AB (ref 11.2–14.5)
WBC: 2.8 10*3/uL — AB (ref 3.9–10.3)
lymph#: 0.6 10*3/uL — ABNORMAL LOW (ref 0.9–3.3)
nRBC: 6 % — ABNORMAL HIGH (ref 0–0)

## 2015-01-09 LAB — COMPREHENSIVE METABOLIC PANEL (CC13)
ALT: 39 U/L (ref 0–55)
AST: 44 U/L — AB (ref 5–34)
Albumin: 3.5 g/dL (ref 3.5–5.0)
Alkaline Phosphatase: 115 U/L (ref 40–150)
Anion Gap: 11 mEq/L (ref 3–11)
BUN: 13.6 mg/dL (ref 7.0–26.0)
CHLORIDE: 104 meq/L (ref 98–109)
CO2: 21 meq/L — AB (ref 22–29)
CREATININE: 0.8 mg/dL (ref 0.6–1.1)
Calcium: 8.4 mg/dL (ref 8.4–10.4)
EGFR: 83 mL/min/{1.73_m2} — ABNORMAL LOW (ref >90)
Glucose: 215 mg/dl — ABNORMAL HIGH (ref 70–140)
Potassium: 3.8 mEq/L (ref 3.5–5.1)
SODIUM: 136 meq/L (ref 136–145)
Total Bilirubin: 0.84 mg/dL (ref 0.20–1.20)
Total Protein: 6.8 g/dL (ref 6.4–8.3)

## 2015-01-09 MED ORDER — FAMOTIDINE IN NACL 20-0.9 MG/50ML-% IV SOLN
20.0000 mg | Freq: Once | INTRAVENOUS | Status: AC
  Administered 2015-01-09: 20 mg via INTRAVENOUS

## 2015-01-09 MED ORDER — SODIUM CHLORIDE 0.9 % IJ SOLN
10.0000 mL | INTRAMUSCULAR | Status: DC | PRN
  Administered 2015-01-09: 10 mL
  Filled 2015-01-09: qty 10

## 2015-01-09 MED ORDER — SODIUM CHLORIDE 0.9 % IV SOLN
Freq: Once | INTRAVENOUS | Status: AC
  Administered 2015-01-09: 12:00:00 via INTRAVENOUS
  Filled 2015-01-09: qty 4

## 2015-01-09 MED ORDER — SODIUM CHLORIDE 0.9 % IV SOLN
Freq: Once | INTRAVENOUS | Status: AC
  Administered 2015-01-09: 11:00:00 via INTRAVENOUS

## 2015-01-09 MED ORDER — DIPHENHYDRAMINE HCL 50 MG/ML IJ SOLN
INTRAMUSCULAR | Status: AC
  Filled 2015-01-09: qty 1

## 2015-01-09 MED ORDER — DIPHENHYDRAMINE HCL 50 MG/ML IJ SOLN
50.0000 mg | Freq: Once | INTRAMUSCULAR | Status: AC
  Administered 2015-01-09: 50 mg via INTRAVENOUS

## 2015-01-09 MED ORDER — PACLITAXEL CHEMO INJECTION 300 MG/50ML
80.0000 mg/m2 | Freq: Once | INTRAVENOUS | Status: AC
  Administered 2015-01-09: 174 mg via INTRAVENOUS
  Filled 2015-01-09: qty 29

## 2015-01-09 MED ORDER — FAMOTIDINE IN NACL 20-0.9 MG/50ML-% IV SOLN
INTRAVENOUS | Status: AC
  Filled 2015-01-09: qty 50

## 2015-01-09 MED ORDER — HEPARIN SOD (PORK) LOCK FLUSH 100 UNIT/ML IV SOLN
500.0000 [IU] | Freq: Once | INTRAVENOUS | Status: AC | PRN
  Administered 2015-01-09: 500 [IU]
  Filled 2015-01-09: qty 5

## 2015-01-09 NOTE — Progress Notes (Addendum)
[facility] OFFICE PROGRESS NOTE   Diagnosis: Breast cancer Oncology History   Breast cancer of upper-inner quadrant of left female breast  Staging form: Breast, AJCC 7th Edition  Clinical stage from 10/08/2014: Stage IIB (T2, N1, M0) - Unsigned  Pathologic: Stage IIB (T2, N1, cM0) - Unsigned       Breast cancer of upper-inner quadrant of left female breast   09/26/2014 Breast US 2.5 cm irregular mass in the upper inner left breast with adjacent 1.1 cm satellite mass and enlarged left axillary lymph nodes, highly suspicious for left breast malignancy and left axillary lymph node metastases. Tissue sampling is recommended.    09/26/2014 Mammogram Spot compression views of the left breast and routine views of both breasts demonstrate a 2 x 2.5 cm irregular mass in the upper inner left breast.    09/29/2014 Initial Biopsy 1. Breast, left, needle core biopsy, mass, 11 o'clock - INVASIVE MAMMARY CARCINOMA, SEE COMMENT. - MAMMARY CARCINOMA IN SITU. 2. Lymph node, needle/core biopsy, left axillary - ONE LYMPH NODE, POSITIVE FOR METASTATIC MAMMARY CARCINOMA (1/1).   09/29/2014 Receptors her2 Estrogen Receptor: 0%, NEGATIVE Progesterone Receptor: 0%, NEGATIVE Proliferation Marker Ki67: 93%   10/01/2014 Initial Diagnosis Breast cancer of upper-inner quadrant of left female breast   10/17/2014 Imaging CT and bone scan negative for distant metastasis    10/24/2014 -  Adjuvant Chemotherapy ddACx4 then weekly Taxol X12             INTERVAL HISTORY:   Ashlee Hill returns as scheduled. She completed cycle 2 weekly Taxol 01/02/2015. She was transfused 2 units of blood 01/07/2015. She notes a significant improvement in her energy level since the blood transfusion. She denies nausea/vomiting. No mouth sores. No diarrhea. She has had some constipation. She is taking Dulcolax with good results. Blood sugars are "leveling out".  Skin on feet continues to peel. No associated pain. No numbness or tingling in her hands or feet. She is concerned she is developing a new lump in the right breast. She continues to note significant improvement in the size of the left breast mass.  Objective:  Vital signs in last 24 hours:  Blood pressure 138/87, pulse 110, temperature 98.2 F (36.8 C), temperature source Oral, resp. rate 20, height '5\' 5"'  (1.651 m), weight 227 lb 4.8 oz (103.103 kg), SpO2 99 %. repeat heart rate 80    HEENT: No thrush or ulcers. Lymphatics: No palpable cervical, supraclavicular or axillary lymph nodes. Resp: Lungs clear bilaterally. Cardio: Regular rate and rhythm. GI: Abdomen soft and nontender. No hepatomegaly. Vascular: No leg edema. Skin: Soles are healing. Breasts: Question tiny residual mass left breast upper mid quadrant. No mass palpated in the right breast. Port-A-Cath without erythema.   Lab Results:  Lab Results  Component Value Date   WBC 2.8* 01/09/2015   HGB 8.8* 01/09/2015   HCT 26.1* 01/09/2015   MCV 96.7 01/09/2015   PLT 136* 01/09/2015   NEUTROABS 2.0 01/09/2015    Imaging:  No results found.  Medications: I have reviewed the patient's current medications.  Assessment/Plan: 1. Left breast invasive ductal carcinoma, cT2N1M0, stage IIB, triple negative diagnosed 09/29/2014. Neoadjuvant dose dense Adriamycin/Cytoxan initiated 10/24/2014 with 4 cycles planned to be followed by Taxol weekly 12. Cycle 4 dose dense Adriamycin/Cytoxan completed 12/12/2014. Weekly Taxol initiated 12/26/2014. 2. History of mucositis related to chemotherapy. 3. Question hand-foot syndrome following cycle 3 Adriamycin/Cytoxan. Improved. 4. Hyperglycemia. She was recently hospitalized with a blood sugar of greater than 400. Now  on insulin. 5. Hospitalization 12/04/2014 through 12/06/2014 with nausea/vomiting/dehydration and hyperglycemia. 6. Anemia. Progressive. Status post red cell transfusion  01/07/2015.   Disposition: Ashlee Hill appears stable. She has completed 2 cycles of weekly Taxol. Plan to proceed with cycle 3 today as scheduled. She will return for a follow-up visit and cycle 4 in one week. She will contact the office in the interim with any problems. We specifically discussed palpable change in the right breast.  Plan reviewed with Dr. Burr Medico.    Ashlee Hill ANP/GNP-BC   01/09/2015  10:09 AM

## 2015-01-09 NOTE — Patient Instructions (Signed)
[facility] Discharge Instructions for Patients Receiving Chemotherapy  Today you received the following chemotherapy agents taxol  To help prevent nausea and vomiting after your treatment, we encourage you to take your nausea medication if needed   If you develop nausea and vomiting that is not controlled by your nausea medication, call the clinic.   BELOW ARE SYMPTOMS THAT SHOULD BE REPORTED IMMEDIATELY:  *FEVER GREATER THAN 100.5 F  *CHILLS WITH OR WITHOUT FEVER  NAUSEA AND VOMITING THAT IS NOT CONTROLLED WITH YOUR NAUSEA MEDICATION  *UNUSUAL SHORTNESS OF BREATH  *UNUSUAL BRUISING OR BLEEDING  TENDERNESS IN MOUTH AND THROAT WITH OR WITHOUT PRESENCE OF ULCERS  *URINARY PROBLEMS  *BOWEL PROBLEMS  UNUSUAL RASH Items with * indicate a potential emergency and should be followed up as soon as possible.  Feel free to call the clinic you have any questions or concerns. The clinic phone number is [phone].  Please show the [place] at check-in to the Emergency Department and triage nurse.

## 2015-01-09 NOTE — Progress Notes (Signed)
Spiritual Care Note  Met Austin briefly in lobby earlier this week and then followed up in infusion room this afternoon.  She shared readily and deeply about her faith, dad's hx cancer, family relationships, hopes and dreams, church involvement, and significant spiritual growth through her illness.  Personal prayer and support/prayer from friends are two key coping tools.  Provided reflective listening, serving as witness to pt's stories and processing, and prayer with pt.  She verbalized gratitude for our connection and time together, as well as for [facility]'s commitment to Spiritual Care and staff's kind encouragement.  Per pt, she has felt very supported by staff:  "You are all exactly where you need to be."  Plan to f/u by phone and/or card.  [facility]one is aware of how to reach chaplain for further connection, but please also page as needs arise.  Thank you.  [place], [place] [facility] [place]

## 2015-01-14 ENCOUNTER — Telehealth: Payer: Self-pay | Admitting: *Deleted

## 2015-01-14 ENCOUNTER — Encounter: Payer: Self-pay | Admitting: *Deleted

## 2015-01-14 NOTE — Telephone Encounter (Signed)
Talked with Dr Burr Medico & informed pt to keep port area/rash clean & dry & apply topical antiseptic cream/oint ie neosporin & we will look at site when she is here fri. & we can use a peripheral vein if necessary.  Pt to call if symptoms worse, fever, puralent drainage, etc.  Pt expresses understanding.  Ned Card NP informed since she is seeing pt 5/27.

## 2015-01-14 NOTE — Telephone Encounter (Signed)
Pt called & left message that she has a heat rash around port where op-site dsg was d/t being on for long period after getting blood transfusion & chemo.  She states that she has had this before & knows it is a heat rash & has been showering & trying to get rid of it but knows she will not be able to tolerate a dsg to the port site for this fri treatment.  She is asking if Dr. Burr Medico has any suggestions.  Message to Dr Burr Medico.

## 2015-01-16 ENCOUNTER — Ambulatory Visit (HOSPITAL_BASED_OUTPATIENT_CLINIC_OR_DEPARTMENT_OTHER): Payer: 59 | Admitting: Nurse Practitioner

## 2015-01-16 ENCOUNTER — Ambulatory Visit (HOSPITAL_BASED_OUTPATIENT_CLINIC_OR_DEPARTMENT_OTHER): Payer: 59

## 2015-01-16 ENCOUNTER — Other Ambulatory Visit (HOSPITAL_BASED_OUTPATIENT_CLINIC_OR_DEPARTMENT_OTHER): Payer: 59

## 2015-01-16 VITALS — BP 123/59 | HR 103 | Temp 98.2°F | Resp 18 | Ht 65.0 in | Wt 222.1 lb

## 2015-01-16 DIAGNOSIS — Z5111 Encounter for antineoplastic chemotherapy: Secondary | ICD-10-CM

## 2015-01-16 DIAGNOSIS — C50212 Malignant neoplasm of upper-inner quadrant of left female breast: Secondary | ICD-10-CM

## 2015-01-16 DIAGNOSIS — R739 Hyperglycemia, unspecified: Secondary | ICD-10-CM

## 2015-01-16 DIAGNOSIS — D6481 Anemia due to antineoplastic chemotherapy: Secondary | ICD-10-CM | POA: Diagnosis not present

## 2015-01-16 DIAGNOSIS — C773 Secondary and unspecified malignant neoplasm of axilla and upper limb lymph nodes: Secondary | ICD-10-CM

## 2015-01-16 DIAGNOSIS — R21 Rash and other nonspecific skin eruption: Secondary | ICD-10-CM

## 2015-01-16 DIAGNOSIS — D649 Anemia, unspecified: Secondary | ICD-10-CM | POA: Diagnosis not present

## 2015-01-16 LAB — COMPREHENSIVE METABOLIC PANEL (CC13)
ALBUMIN: 3.7 g/dL (ref 3.5–5.0)
ALT: 29 U/L (ref 0–55)
ANION GAP: 10 meq/L (ref 3–11)
AST: 34 U/L (ref 5–34)
Alkaline Phosphatase: 117 U/L (ref 40–150)
BUN: 17.3 mg/dL (ref 7.0–26.0)
CHLORIDE: 103 meq/L (ref 98–109)
CO2: 22 meq/L (ref 22–29)
Calcium: 9.1 mg/dL (ref 8.4–10.4)
Creatinine: 0.9 mg/dL (ref 0.6–1.1)
EGFR: 78 mL/min/{1.73_m2} — AB (ref >90)
Glucose: 171 mg/dl — ABNORMAL HIGH (ref 70–140)
POTASSIUM: 4.1 meq/L (ref 3.5–5.1)
Sodium: 136 mEq/L (ref 136–145)
TOTAL PROTEIN: 7.1 g/dL (ref 6.4–8.3)
Total Bilirubin: 0.81 mg/dL (ref 0.20–1.20)

## 2015-01-16 LAB — CBC WITH DIFFERENTIAL/PLATELET
BASO%: 0.4 % (ref 0.0–2.0)
Basophils Absolute: 0 10*3/uL (ref 0.0–0.1)
EOS%: 1.8 % (ref 0.0–7.0)
Eosinophils Absolute: 0.1 10*3/uL (ref 0.0–0.5)
HEMATOCRIT: 25.8 % — AB (ref 34.8–46.6)
HGB: 8.8 g/dL — ABNORMAL LOW (ref 11.6–15.9)
LYMPH#: 0.7 10*3/uL — AB (ref 0.9–3.3)
LYMPH%: 16.6 % (ref 14.0–49.7)
MCH: 33.3 pg (ref 25.1–34.0)
MCHC: 34.1 g/dL (ref 31.5–36.0)
MCV: 97.7 fL (ref 79.5–101.0)
MONO#: 0.4 10*3/uL (ref 0.1–0.9)
MONO%: 9.2 % (ref 0.0–14.0)
NEUT#: 3.2 10*3/uL (ref 1.5–6.5)
NEUT%: 72 % (ref 38.4–76.8)
NRBC: 3 % — AB (ref 0–0)
Platelets: 191 10*3/uL (ref 145–400)
RBC: 2.64 10*6/uL — ABNORMAL LOW (ref 3.70–5.45)
RDW: 22 % — ABNORMAL HIGH (ref 11.2–14.5)
WBC: 4.5 10*3/uL (ref 3.9–10.3)

## 2015-01-16 MED ORDER — FAMOTIDINE IN NACL 20-0.9 MG/50ML-% IV SOLN
20.0000 mg | Freq: Once | INTRAVENOUS | Status: AC
  Administered 2015-01-16: 20 mg via INTRAVENOUS

## 2015-01-16 MED ORDER — SODIUM CHLORIDE 0.9 % IV SOLN
Freq: Once | INTRAVENOUS | Status: AC
  Administered 2015-01-16: 12:00:00 via INTRAVENOUS

## 2015-01-16 MED ORDER — PACLITAXEL CHEMO INJECTION 300 MG/50ML
80.0000 mg/m2 | Freq: Once | INTRAVENOUS | Status: AC
  Administered 2015-01-16: 174 mg via INTRAVENOUS
  Filled 2015-01-16: qty 29

## 2015-01-16 MED ORDER — SODIUM CHLORIDE 0.9 % IV SOLN
Freq: Once | INTRAVENOUS | Status: AC
  Administered 2015-01-16: 13:00:00 via INTRAVENOUS
  Filled 2015-01-16: qty 4

## 2015-01-16 MED ORDER — DIPHENHYDRAMINE HCL 50 MG/ML IJ SOLN
50.0000 mg | Freq: Once | INTRAMUSCULAR | Status: AC
  Administered 2015-01-16: 50 mg via INTRAVENOUS

## 2015-01-16 NOTE — Patient Instructions (Signed)
[facility] Discharge Instructions for Patients Receiving Chemotherapy  Today you received the following chemotherapy agents:  Taxol  To help prevent nausea and vomiting after your treatment, we encourage you to take your nausea medication as prescribed.   If you develop nausea and vomiting that is not controlled by your nausea medication, call the clinic.   BELOW ARE SYMPTOMS THAT SHOULD BE REPORTED IMMEDIATELY:  *FEVER GREATER THAN 100.5 F  *CHILLS WITH OR WITHOUT FEVER  NAUSEA AND VOMITING THAT IS NOT CONTROLLED WITH YOUR NAUSEA MEDICATION  *UNUSUAL SHORTNESS OF BREATH  *UNUSUAL BRUISING OR BLEEDING  TENDERNESS IN MOUTH AND THROAT WITH OR WITHOUT PRESENCE OF ULCERS  *URINARY PROBLEMS  *BOWEL PROBLEMS  UNUSUAL RASH Items with * indicate a potential emergency and should be followed up as soon as possible.  Feel free to call the clinic you have any questions or concerns. The clinic phone number is [phone].  Please show the CHEMO ALERT CARD at check-in to the Emergency Department and triage nurse.   

## 2015-01-16 NOTE — Progress Notes (Addendum)
Ashlee Hill     Diagnosis: Breast cancer Oncology History   Breast cancer of upper-inner quadrant of left female breast  Staging form: Breast, AJCC 7th Edition  Clinical stage from 10/08/2014: Stage IIB (T2, N1, M0) - Unsigned  Pathologic: Stage IIB (T2, N1, cM0) - Unsigned       Breast cancer of upper-inner quadrant of left female breast   09/26/2014 Breast US 2.5 cm irregular mass in the upper inner left breast with adjacent 1.1 cm satellite mass and enlarged left axillary lymph nodes, highly suspicious for left breast malignancy and left axillary lymph node metastases. Tissue sampling is recommended.    09/26/2014 Mammogram Spot compression views of the left breast and routine views of both breasts demonstrate a 2 x 2.5 cm irregular mass in the upper inner left breast.    09/29/2014 Initial Biopsy 1. Breast, left, needle core biopsy, mass, 11 o'clock - INVASIVE MAMMARY CARCINOMA, SEE COMMENT. - MAMMARY CARCINOMA IN SITU. 2. Lymph node, needle/core biopsy, left axillary - ONE LYMPH NODE, POSITIVE FOR METASTATIC MAMMARY CARCINOMA (1/1).   09/29/2014 Receptors her2 Estrogen Receptor: 0%, NEGATIVE Progesterone Receptor: 0%, NEGATIVE Proliferation Marker Ki67: 93%   10/01/2014 Initial Diagnosis Breast cancer of upper-inner quadrant of left female breast   10/17/2014 Imaging CT and bone scan negative for distant metastasis    10/24/2014 -  Adjuvant Chemotherapy ddACx4 then weekly Taxol X12                 INTERVAL HISTORY:   Ashlee Hill returns as scheduled. She completed cycle 3 weekly Taxol 01/09/2015. She denies nausea/vomiting. No mouth sores. No diarrhea. No numbness or tingling in her hands or feet. She notes marked improvement in her blood sugars. On 01/10/2015 she noted a "rash" over the Port-A-Cath site. She thinks the rash is related to the  dressing that was used the day prior. She has been taking hot showers and applying alcohol to the rash. She feels it is improving though it remains very itchy. She reports a 2 week history of blood clots when she blows her nose followed by a nosebleed. This is occurring intermittently throughout the day. She wonders if she has a sinus infection. She feels a pressure around the left eye. She denies fever.  Objective:  Vital signs in last 24 hours:  Blood pressure 123/59, pulse 103, temperature 98.2 F (36.8 C), temperature source Oral, resp. rate 18, height '5\' 5"'  (1.651 m), weight 222 lb 1.6 oz (100.744 kg), SpO2 99 %.    HEENT: No thrush or ulcers. Dried blood external nasal passage. Lymphatics: No palpable cervical or supra-clavicular lymph nodes. Resp: Lungs clear bilaterally. Cardio: Regular rate and rhythm. GI: Abdomen soft and nontender. No hepatomegaly. Vascular: No leg edema. Calves nontender.  Skin: She no longer has peeling skin over the soles of the feet. Raised erythematous maculopapular rash in a dressing distribution covering the Port-A-Cath and surrounding skin. The rash is excoriated in some areas. The rash appears to be drying. There is no drainage.   Lab Results:  Lab Results  Component Value Date   WBC 4.5 01/16/2015   HGB 8.8* 01/16/2015   HCT 25.8* 01/16/2015   MCV 97.7 01/16/2015   PLT 191 01/16/2015   NEUTROABS 3.2 01/16/2015    Imaging:  No results found.  Medications: I have reviewed the patient's current medications.  Assessment/Plan: 1. Left breast invasive ductal carcinoma, cT2N1M0, stage IIB, triple negative diagnosed 09/29/2014. Neoadjuvant dose dense Adriamycin/Cytoxan initiated  10/24/2014 with 4 cycles planned to be followed by Taxol weekly 12. Cycle 4 dose dense Adriamycin/Cytoxan completed 12/12/2014. Weekly Taxol initiated 12/26/2014. 2. History of mucositis related to chemotherapy. 3. Question hand-foot syndrome following cycle 3  Adriamycin/Cytoxan. Improved. 4. Hyperglycemia. She was recently hospitalized with a blood sugar of greater than 400. Now on insulin. 5. Hospitalization 12/04/2014 through 12/06/2014 with nausea/vomiting/dehydration and hyperglycemia. 6. Anemia secondary to chemotherapy. Status post red cell transfusion 01/07/2015. Hemoglobin stable 01/16/2015. 7. Rash right chest wall. Likely contact/allergic dermatitis. 8. Epistaxis. Question etiology.    Disposition: Ashlee Hill appears stable. She has completed 3 cycles of weekly Taxol. Plan to proceed with cycle 4 today as scheduled.  The rash over the right chest wall is likely a contact/allergic dermatitis related to the Port-A-Cath dressing. The rash appears to be healing. She will use over-the-counter hydrocortisone cream as needed for the pruritus. If the rash extends or she notes any drainage she will contact the office. Due to the rash we will not use the Port-A-Cath today. She will be treated peripherally.  The etiology of the epistaxis is unclear. Her platelet count is normal. We instructed her to discontinue daily aspirin. She will try some saline rinses.  She will return for a follow-up visit and cycle 5 weekly Taxol in one week. She will contact the office in the interim as outlined above or with any other problems.  Patient seen with Dr. Burr Medico.  Ned Card ANP/GNP-BC   01/16/2015  11:24 AM

## 2015-01-21 ENCOUNTER — Telehealth: Payer: Self-pay | Admitting: Hematology

## 2015-01-21 NOTE — Telephone Encounter (Signed)
Adjusted treatment per staff message. Schedule advised.

## 2015-01-21 NOTE — Telephone Encounter (Signed)
t cld left voicemail wanting confirmed times for appt 6/3-adv pt 1:45-pt understood

## 2015-01-23 ENCOUNTER — Other Ambulatory Visit (HOSPITAL_BASED_OUTPATIENT_CLINIC_OR_DEPARTMENT_OTHER): Payer: 59

## 2015-01-23 ENCOUNTER — Ambulatory Visit (HOSPITAL_BASED_OUTPATIENT_CLINIC_OR_DEPARTMENT_OTHER): Payer: 59 | Admitting: Physician Assistant

## 2015-01-23 ENCOUNTER — Encounter: Payer: Self-pay | Admitting: Physician Assistant

## 2015-01-23 ENCOUNTER — Other Ambulatory Visit: Payer: Self-pay | Admitting: Hematology and Oncology

## 2015-01-23 ENCOUNTER — Ambulatory Visit (HOSPITAL_BASED_OUTPATIENT_CLINIC_OR_DEPARTMENT_OTHER): Payer: 59

## 2015-01-23 VITALS — BP 141/80 | HR 109 | Temp 98.4°F | Resp 18 | Ht 65.0 in | Wt 225.0 lb

## 2015-01-23 DIAGNOSIS — C50212 Malignant neoplasm of upper-inner quadrant of left female breast: Secondary | ICD-10-CM

## 2015-01-23 DIAGNOSIS — Z5111 Encounter for antineoplastic chemotherapy: Secondary | ICD-10-CM | POA: Diagnosis not present

## 2015-01-23 DIAGNOSIS — C773 Secondary and unspecified malignant neoplasm of axilla and upper limb lymph nodes: Secondary | ICD-10-CM

## 2015-01-23 DIAGNOSIS — D6481 Anemia due to antineoplastic chemotherapy: Secondary | ICD-10-CM

## 2015-01-23 DIAGNOSIS — R739 Hyperglycemia, unspecified: Secondary | ICD-10-CM

## 2015-01-23 DIAGNOSIS — R21 Rash and other nonspecific skin eruption: Secondary | ICD-10-CM

## 2015-01-23 LAB — CBC WITH DIFFERENTIAL/PLATELET
BASO%: 0.7 % (ref 0.0–2.0)
BASOS ABS: 0 10*3/uL (ref 0.0–0.1)
EOS%: 0.9 % (ref 0.0–7.0)
Eosinophils Absolute: 0 10*3/uL (ref 0.0–0.5)
HCT: 23.9 % — ABNORMAL LOW (ref 34.8–46.6)
HGB: 8.2 g/dL — ABNORMAL LOW (ref 11.6–15.9)
LYMPH%: 20.5 % (ref 14.0–49.7)
MCH: 34.2 pg — ABNORMAL HIGH (ref 25.1–34.0)
MCHC: 34.3 g/dL (ref 31.5–36.0)
MCV: 99.6 fL (ref 79.5–101.0)
MONO#: 0.3 10*3/uL (ref 0.1–0.9)
MONO%: 7.8 % (ref 0.0–14.0)
NEUT#: 3 10*3/uL (ref 1.5–6.5)
NEUT%: 70.1 % (ref 38.4–76.8)
NRBC: 8 % — AB (ref 0–0)
Platelets: 210 10*3/uL (ref 145–400)
RBC: 2.4 10*6/uL — AB (ref 3.70–5.45)
RDW: 23 % — AB (ref 11.2–14.5)
WBC: 4.2 10*3/uL (ref 3.9–10.3)
lymph#: 0.9 10*3/uL (ref 0.9–3.3)

## 2015-01-23 LAB — COMPREHENSIVE METABOLIC PANEL (CC13)
ALT: 26 U/L (ref 0–55)
ANION GAP: 13 meq/L — AB (ref 3–11)
AST: 28 U/L (ref 5–34)
Albumin: 3.9 g/dL (ref 3.5–5.0)
Alkaline Phosphatase: 100 U/L (ref 40–150)
BUN: 7.8 mg/dL (ref 7.0–26.0)
CO2: 24 mEq/L (ref 22–29)
Calcium: 9.2 mg/dL (ref 8.4–10.4)
Chloride: 101 mEq/L (ref 98–109)
Creatinine: 1 mg/dL (ref 0.6–1.1)
EGFR: 66 mL/min/{1.73_m2} — ABNORMAL LOW (ref >90)
Glucose: 170 mg/dl — ABNORMAL HIGH (ref 70–140)
Potassium: 4 mEq/L (ref 3.5–5.1)
Sodium: 138 mEq/L (ref 136–145)
TOTAL PROTEIN: 7.1 g/dL (ref 6.4–8.3)
Total Bilirubin: 0.88 mg/dL (ref 0.20–1.20)

## 2015-01-23 MED ORDER — DIPHENHYDRAMINE HCL 50 MG/ML IJ SOLN
50.0000 mg | Freq: Once | INTRAMUSCULAR | Status: AC
  Administered 2015-01-23: 50 mg via INTRAVENOUS

## 2015-01-23 MED ORDER — FAMOTIDINE IN NACL 20-0.9 MG/50ML-% IV SOLN
20.0000 mg | Freq: Once | INTRAVENOUS | Status: AC
  Administered 2015-01-23: 20 mg via INTRAVENOUS

## 2015-01-23 MED ORDER — SODIUM CHLORIDE 0.9 % IV SOLN
Freq: Once | INTRAVENOUS | Status: AC
  Administered 2015-01-23: 15:00:00 via INTRAVENOUS
  Filled 2015-01-23: qty 4

## 2015-01-23 MED ORDER — SODIUM CHLORIDE 0.9 % IV SOLN
250.0000 mL | Freq: Once | INTRAVENOUS | Status: AC
  Administered 2015-01-23: 250 mL via INTRAVENOUS

## 2015-01-23 MED ORDER — DIPHENHYDRAMINE HCL 50 MG/ML IJ SOLN
INTRAMUSCULAR | Status: AC
  Filled 2015-01-23: qty 1

## 2015-01-23 MED ORDER — DEXTROSE 5 % IV SOLN
80.0000 mg/m2 | Freq: Once | INTRAVENOUS | Status: AC
  Administered 2015-01-23: 174 mg via INTRAVENOUS
  Filled 2015-01-23: qty 29

## 2015-01-23 MED ORDER — FAMOTIDINE IN NACL 20-0.9 MG/50ML-% IV SOLN
INTRAVENOUS | Status: AC
  Filled 2015-01-23: qty 50

## 2015-01-23 NOTE — Patient Instructions (Signed)
[facility] Discharge Instructions for Patients Receiving Chemotherapy  Today you received the following chemotherapy agent: Taxol   To help prevent nausea and vomiting after your treatment, we encourage you to take your nausea medication as prescribed.    If you develop nausea and vomiting that is not controlled by your nausea medication, call the clinic.   BELOW ARE SYMPTOMS THAT SHOULD BE REPORTED IMMEDIATELY:  *FEVER GREATER THAN 100.5 F  *CHILLS WITH OR WITHOUT FEVER  NAUSEA AND VOMITING THAT IS NOT CONTROLLED WITH YOUR NAUSEA MEDICATION  *UNUSUAL SHORTNESS OF BREATH  *UNUSUAL BRUISING OR BLEEDING  TENDERNESS IN MOUTH AND THROAT WITH OR WITHOUT PRESENCE OF ULCERS  *URINARY PROBLEMS  *BOWEL PROBLEMS  UNUSUAL RASH Items with * indicate a potential emergency and should be followed up as soon as possible.  Feel free to call the clinic you have any questions or concerns. The clinic phone number is [phone].  Please show the CHEMO ALERT CARD at check-in to the Emergency Department and triage nurse.   

## 2015-01-23 NOTE — Progress Notes (Signed)
[facility] OFFICE PROGRESS NOTE     Diagnosis: Breast cancer Oncology History   Breast cancer of upper-inner quadrant of left female breast  Staging form: Breast, AJCC 7th Edition  Clinical stage from 10/08/2014: Stage IIB (T2, N1, M0) - Unsigned  Pathologic: Stage IIB (T2, N1, cM0) - Unsigned       Breast cancer of upper-inner quadrant of left female breast   09/26/2014 Breast US 2.5 cm irregular mass in the upper inner left breast with adjacent 1.1 cm satellite mass and enlarged left axillary lymph nodes, highly suspicious for left breast malignancy and left axillary lymph node metastases. Tissue sampling is recommended.    09/26/2014 Mammogram Spot compression views of the left breast and routine views of both breasts demonstrate a 2 x 2.5 cm irregular mass in the upper inner left breast.    09/29/2014 Initial Biopsy 1. Breast, left, needle core biopsy, mass, 11 o'clock - INVASIVE MAMMARY CARCINOMA, SEE COMMENT. - MAMMARY CARCINOMA IN SITU. 2. Lymph node, needle/core biopsy, left axillary - ONE LYMPH NODE, POSITIVE FOR METASTATIC MAMMARY CARCINOMA (1/1).   09/29/2014 Receptors her2 Estrogen Receptor: 0%, NEGATIVE Progesterone Receptor: 0%, NEGATIVE Proliferation Marker Ki67: 93%   10/01/2014 Initial Diagnosis Breast cancer of upper-inner quadrant of left female breast   10/17/2014 Imaging CT and bone scan negative for distant metastasis    10/24/2014 -  Adjuvant Chemotherapy ddACx4 then weekly Taxol X12                 INTERVAL HISTORY:   Ashlee Hill returns as scheduled. She completed cycle 4 weekly Taxol 01/16/2015. She denies nausea/vomiting. No mouth sores. No diarrhea. Denied numbness or tingling in her hands or feet. She notes marked improvement in her blood sugars. This is being managed by her primary care physician. She is currently on 25 units of Lantus at  bedtime and sliding scale insulin during the day as needed. She reports that the rash on her chest is resolving. She denies fever, chills, nausea or vomiting, diarrhea or constipation.  Objective:  Vital signs in last 24 hours:  Blood pressure 141/80, pulse 109, temperature 98.4 F (36.9 C), temperature source Oral, resp. rate 18, height '5\' 5"'  (1.651 m), weight 225 lb (102.059 kg), SpO2 100 %.    HEENT: No thrush or ulcers. Dried blood external nasal passage. Lymphatics: No palpable cervical or supra-clavicular lymph nodes. Resp: Lungs clear bilaterally. Cardio: Regular rate and rhythm. GI: Abdomen soft and nontender. No hepatomegaly. Vascular: No leg edema. Calves nontender.  Skin: The rash is now less erythematous and is improved from 1 week ago There is no drainage.   Lab Results:  Lab Results  Component Value Date   WBC 4.2 01/23/2015   HGB 8.2* 01/23/2015   HCT 23.9* 01/23/2015   MCV 99.6 01/23/2015   PLT 210 01/23/2015   NEUTROABS 3.0 01/23/2015    Imaging:  No results found.  Medications: I have reviewed the patient's current medications.  Assessment/Plan: 1. Left breast invasive ductal carcinoma, cT2N1M0, stage IIB, triple negative diagnosed 09/29/2014. Neoadjuvant dose dense Adriamycin/Cytoxan initiated 10/24/2014 with 4 cycles planned to be followed by Taxol weekly 12. Cycle 4 dose dense Adriamycin/Cytoxan completed 12/12/2014. Weekly Taxol initiated 12/26/2014. 2. History of mucositis related to chemotherapy. 3. Question hand-foot syndrome following cycle 3 Adriamycin/Cytoxan. Improved. 4. Hyperglycemia. She was recently hospitalized with a blood sugar of greater than 400. Now on insulin. 5. Hospitalization 12/04/2014 through 12/06/2014 with nausea/vomiting/dehydration and hyperglycemia. 6. Anemia secondary to chemotherapy. Status  post red cell transfusion 01/07/2015. Hemoglobin stable 01/16/2015. 7. Rash right chest wall. Likely contact/allergic  dermatitis. 8. Epistaxis. Question etiology.    Disposition: Ashlee Hill appears stable. She has completed 4 cycles of weekly Taxol. Plan to proceed with cycle 5 today as scheduled.  The rash over the right chest wall is likely a contact/allergic dermatitis related to the Port-A-Cath dressing. The rash appears to be resolving. She will continue to use over-the-counter hydrocortisone cream as needed for the pruritus.Due to the rash we will not use the Port-A-Cath today. She will be treated peripherally.  She will return for a follow-up visit and cycle 6 weekly Taxol in one week. She will contact the office in the interim as outlined above or with any other problems.    Carlton Adam, PA-C  01/23/2015  2:54 PM

## 2015-01-23 NOTE — Progress Notes (Signed)
Patient reports "heat rash" to port site. Chemo to be given peripherally.

## 2015-01-24 NOTE — Patient Instructions (Signed)
Continue labs and chemotherapy as scheduled Follow-up in one week 

## 2015-01-26 ENCOUNTER — Telehealth: Payer: Self-pay | Admitting: Hematology

## 2015-01-26 ENCOUNTER — Telehealth: Payer: Self-pay | Admitting: *Deleted

## 2015-01-26 ENCOUNTER — Other Ambulatory Visit: Payer: Self-pay | Admitting: *Deleted

## 2015-01-26 DIAGNOSIS — D6481 Anemia due to antineoplastic chemotherapy: Secondary | ICD-10-CM

## 2015-01-26 DIAGNOSIS — T451X5A Adverse effect of antineoplastic and immunosuppressive drugs, initial encounter: Principal | ICD-10-CM

## 2015-01-26 NOTE — Telephone Encounter (Signed)
VM message from patient @ 2:03 pm stating that she feels very weak and believes her HGB has dropped s/p chemo on 01/23/15. Prior to chemo HGB was 8.2. Spoke with Dr. Ernestina Penna nurse, Janifer Adie and agreed that pt will need CBC and hold tube for blood bank in am. Call back to patient and instructed her to be here tomorrow at 10:30 am for labs.  She voiced understanding.  She states she feels very weak and can barely walk out to her car and back. She has to lie down after that.  She does have 15 minute appt with her pcp for sinus infection tomorrow @ 9 am.

## 2015-01-26 NOTE — Telephone Encounter (Signed)
Labs added per 06/06 POF for 06/07 @10 :30 pt is aware.... KJ

## 2015-01-27 ENCOUNTER — Other Ambulatory Visit (HOSPITAL_BASED_OUTPATIENT_CLINIC_OR_DEPARTMENT_OTHER): Payer: 59

## 2015-01-27 ENCOUNTER — Telehealth: Payer: Self-pay | Admitting: *Deleted

## 2015-01-27 ENCOUNTER — Ambulatory Visit (HOSPITAL_BASED_OUTPATIENT_CLINIC_OR_DEPARTMENT_OTHER): Payer: 59

## 2015-01-27 ENCOUNTER — Other Ambulatory Visit: Payer: Self-pay | Admitting: *Deleted

## 2015-01-27 ENCOUNTER — Ambulatory Visit (HOSPITAL_COMMUNITY)
Admission: RE | Admit: 2015-01-27 | Discharge: 2015-01-27 | Disposition: A | Discharge status: Home / Self Care | Payer: 59 | Source: Ambulatory Visit | Attending: Hematology | Admitting: Hematology

## 2015-01-27 ENCOUNTER — Encounter (HOSPITAL_COMMUNITY): Payer: 59

## 2015-01-27 VITALS — BP 136/66 | HR 103 | Temp 99.2°F | Resp 16

## 2015-01-27 DIAGNOSIS — D508 Other iron deficiency anemias: Secondary | ICD-10-CM

## 2015-01-27 DIAGNOSIS — R739 Hyperglycemia, unspecified: Secondary | ICD-10-CM | POA: Diagnosis not present

## 2015-01-27 DIAGNOSIS — D6481 Anemia due to antineoplastic chemotherapy: Secondary | ICD-10-CM | POA: Diagnosis not present

## 2015-01-27 DIAGNOSIS — C50212 Malignant neoplasm of upper-inner quadrant of left female breast: Secondary | ICD-10-CM

## 2015-01-27 DIAGNOSIS — T451X5A Adverse effect of antineoplastic and immunosuppressive drugs, initial encounter: Principal | ICD-10-CM

## 2015-01-27 LAB — CBC WITH DIFFERENTIAL/PLATELET
BASO%: 0.3 % (ref 0.0–2.0)
Basophils Absolute: 0 10*3/uL (ref 0.0–0.1)
EOS ABS: 0 10*3/uL (ref 0.0–0.5)
EOS%: 1.3 % (ref 0.0–7.0)
HCT: 21.5 % — ABNORMAL LOW (ref 34.8–46.6)
HGB: 7.3 g/dL — ABNORMAL LOW (ref 11.6–15.9)
LYMPH%: 12.5 % — ABNORMAL LOW (ref 14.0–49.7)
MCH: 34.6 pg — ABNORMAL HIGH (ref 25.1–34.0)
MCHC: 34 g/dL (ref 31.5–36.0)
MCV: 101.9 fL — ABNORMAL HIGH (ref 79.5–101.0)
MONO#: 0.1 10*3/uL (ref 0.1–0.9)
MONO%: 3.1 % (ref 0.0–14.0)
NEUT%: 82.8 % — ABNORMAL HIGH (ref 38.4–76.8)
NEUTROS ABS: 2.6 10*3/uL (ref 1.5–6.5)
Platelets: 169 10*3/uL (ref 145–400)
RBC: 2.11 10*6/uL — AB (ref 3.70–5.45)
RDW: 23.3 % — ABNORMAL HIGH (ref 11.2–14.5)
WBC: 3.2 10*3/uL — AB (ref 3.9–10.3)
lymph#: 0.4 10*3/uL — ABNORMAL LOW (ref 0.9–3.3)

## 2015-01-27 LAB — COMPREHENSIVE METABOLIC PANEL (CC13)
ALT: 22 U/L (ref 0–55)
ANION GAP: 11 meq/L (ref 3–11)
AST: 25 U/L (ref 5–34)
Albumin: 3.8 g/dL (ref 3.5–5.0)
Alkaline Phosphatase: 100 U/L (ref 40–150)
BUN: 18.4 mg/dL (ref 7.0–26.0)
CO2: 27 mEq/L (ref 22–29)
Calcium: 9.4 mg/dL (ref 8.4–10.4)
Chloride: 100 mEq/L (ref 98–109)
Creatinine: 0.9 mg/dL (ref 0.6–1.1)
EGFR: 77 mL/min/{1.73_m2} — ABNORMAL LOW (ref >90)
GLUCOSE: 218 mg/dL — AB (ref 70–140)
Potassium: 4.2 mEq/L (ref 3.5–5.1)
Sodium: 138 mEq/L (ref 136–145)
Total Bilirubin: 1.05 mg/dL (ref 0.20–1.20)
Total Protein: 6.9 g/dL (ref 6.4–8.3)

## 2015-01-27 LAB — HOLD TUBE, BLOOD BANK

## 2015-01-27 LAB — PREPARE RBC (CROSSMATCH)

## 2015-01-27 MED ORDER — ACETAMINOPHEN 325 MG PO TABS
ORAL_TABLET | ORAL | Status: AC
  Filled 2015-01-27: qty 2

## 2015-01-27 MED ORDER — SODIUM CHLORIDE 0.9 % IJ SOLN
10.0000 mL | INTRAMUSCULAR | Status: AC | PRN
  Administered 2015-01-27: 10 mL
  Filled 2015-01-27: qty 10

## 2015-01-27 MED ORDER — SODIUM CHLORIDE 0.9 % IV SOLN
250.0000 mL | Freq: Once | INTRAVENOUS | Status: AC
  Administered 2015-01-27: 250 mL via INTRAVENOUS

## 2015-01-27 MED ORDER — DIPHENHYDRAMINE HCL 25 MG PO CAPS
ORAL_CAPSULE | ORAL | Status: AC
  Filled 2015-01-27: qty 1

## 2015-01-27 MED ORDER — LORAZEPAM 1 MG PO TABS: 1.0000 mg | ORAL_TABLET | Freq: Every evening | ORAL | Status: DC | PRN

## 2015-01-27 MED ORDER — DIPHENHYDRAMINE HCL 25 MG PO CAPS
25.0000 mg | ORAL_CAPSULE | Freq: Once | ORAL | Status: AC
  Administered 2015-01-27: 25 mg via ORAL

## 2015-01-27 MED ORDER — HEPARIN SOD (PORK) LOCK FLUSH 100 UNIT/ML IV SOLN
500.0000 [IU] | Freq: Every day | INTRAVENOUS | Status: AC | PRN
  Administered 2015-01-27: 500 [IU]
  Filled 2015-01-27: qty 5

## 2015-01-27 MED ORDER — ACETAMINOPHEN 325 MG PO TABS
650.0000 mg | ORAL_TABLET | Freq: Once | ORAL | Status: AC
  Administered 2015-01-27: 650 mg via ORAL

## 2015-01-27 NOTE — Telephone Encounter (Signed)
Open by misake 

## 2015-01-27 NOTE — Patient Instructions (Signed)

## 2015-01-28 LAB — TYPE AND SCREEN
ABO/RH(D): O NEG
Antibody Screen: NEGATIVE
UNIT DIVISION: 0
Unit division: 0

## 2015-01-30 ENCOUNTER — Ambulatory Visit: Payer: 59

## 2015-01-30 ENCOUNTER — Ambulatory Visit (HOSPITAL_BASED_OUTPATIENT_CLINIC_OR_DEPARTMENT_OTHER): Payer: 59 | Admitting: Hematology

## 2015-01-30 ENCOUNTER — Ambulatory Visit (HOSPITAL_BASED_OUTPATIENT_CLINIC_OR_DEPARTMENT_OTHER): Payer: 59

## 2015-01-30 ENCOUNTER — Encounter: Payer: Self-pay | Admitting: Hematology

## 2015-01-30 ENCOUNTER — Telehealth: Payer: Self-pay | Admitting: Hematology

## 2015-01-30 ENCOUNTER — Other Ambulatory Visit (HOSPITAL_BASED_OUTPATIENT_CLINIC_OR_DEPARTMENT_OTHER): Payer: 59

## 2015-01-30 VITALS — BP 159/77 | HR 119 | Temp 98.5°F | Resp 20 | Ht 65.0 in | Wt 231.0 lb

## 2015-01-30 DIAGNOSIS — R112 Nausea with vomiting, unspecified: Secondary | ICD-10-CM

## 2015-01-30 DIAGNOSIS — Z5111 Encounter for antineoplastic chemotherapy: Secondary | ICD-10-CM | POA: Diagnosis not present

## 2015-01-30 DIAGNOSIS — C773 Secondary and unspecified malignant neoplasm of axilla and upper limb lymph nodes: Secondary | ICD-10-CM

## 2015-01-30 DIAGNOSIS — E119 Type 2 diabetes mellitus without complications: Secondary | ICD-10-CM | POA: Diagnosis not present

## 2015-01-30 DIAGNOSIS — C50212 Malignant neoplasm of upper-inner quadrant of left female breast: Secondary | ICD-10-CM

## 2015-01-30 DIAGNOSIS — D6481 Anemia due to antineoplastic chemotherapy: Secondary | ICD-10-CM

## 2015-01-30 DIAGNOSIS — Z171 Estrogen receptor negative status [ER-]: Secondary | ICD-10-CM

## 2015-01-30 DIAGNOSIS — D61810 Antineoplastic chemotherapy induced pancytopenia: Secondary | ICD-10-CM

## 2015-01-30 LAB — COMPREHENSIVE METABOLIC PANEL (CC13)
ALT: 48 U/L (ref 0–55)
ANION GAP: 15 meq/L — AB (ref 3–11)
AST: 53 U/L — ABNORMAL HIGH (ref 5–34)
Albumin: 3.8 g/dL (ref 3.5–5.0)
Alkaline Phosphatase: 115 U/L (ref 40–150)
BUN: 11.7 mg/dL (ref 7.0–26.0)
CALCIUM: 8.9 mg/dL (ref 8.4–10.4)
CO2: 19 mEq/L — ABNORMAL LOW (ref 22–29)
Chloride: 103 mEq/L (ref 98–109)
Creatinine: 0.8 mg/dL (ref 0.6–1.1)
EGFR: 82 mL/min/{1.73_m2} — AB (ref >90)
GLUCOSE: 374 mg/dL — AB (ref 70–140)
POTASSIUM: 4.2 meq/L (ref 3.5–5.1)
Sodium: 137 mEq/L (ref 136–145)
TOTAL PROTEIN: 7.2 g/dL (ref 6.4–8.3)
Total Bilirubin: 1 mg/dL (ref 0.20–1.20)

## 2015-01-30 LAB — CBC & DIFF AND RETIC
BASO%: 0.2 % (ref 0.0–2.0)
BASOS ABS: 0 10*3/uL (ref 0.0–0.1)
EOS%: 0 % (ref 0.0–7.0)
Eosinophils Absolute: 0 10*3/uL (ref 0.0–0.5)
HCT: 29.1 % — ABNORMAL LOW (ref 34.8–46.6)
HGB: 10.1 g/dL — ABNORMAL LOW (ref 11.6–15.9)
IMMATURE RETIC FRACT: 24.5 % — AB (ref 1.60–10.00)
LYMPH#: 0.6 10*3/uL — AB (ref 0.9–3.3)
LYMPH%: 14.7 % (ref 14.0–49.7)
MCH: 34 pg (ref 25.1–34.0)
MCHC: 34.7 g/dL (ref 31.5–36.0)
MCV: 98 fL (ref 79.5–101.0)
MONO#: 0.1 10*3/uL (ref 0.1–0.9)
MONO%: 3 % (ref 0.0–14.0)
NEUT%: 82.1 % — ABNORMAL HIGH (ref 38.4–76.8)
NEUTROS ABS: 3.5 10*3/uL (ref 1.5–6.5)
PLATELETS: 223 10*3/uL (ref 145–400)
RBC: 2.97 10*6/uL — ABNORMAL LOW (ref 3.70–5.45)
RDW: 23.3 % — ABNORMAL HIGH (ref 11.2–14.5)
Retic %: 4.23 % — ABNORMAL HIGH (ref 0.70–2.10)
Retic Ct Abs: 125.63 10*3/uL — ABNORMAL HIGH (ref 33.70–90.70)
WBC: 4.3 10*3/uL (ref 3.9–10.3)

## 2015-01-30 LAB — IRON AND TIBC CHCC
%SAT: 24 % (ref 21–57)
Iron: 96 ug/dL (ref 41–142)
TIBC: 399 ug/dL (ref 236–444)
UIBC: 303 ug/dL (ref 120–384)

## 2015-01-30 LAB — WHOLE BLOOD GLUCOSE: Glucose: 302 mg/dL — ABNORMAL HIGH (ref 70–100)

## 2015-01-30 LAB — FERRITIN CHCC: Ferritin: 1763 ng/ml — ABNORMAL HIGH (ref 9–269)

## 2015-01-30 LAB — LACTATE DEHYDROGENASE (CC13): LDH: 308 U/L — ABNORMAL HIGH (ref 125–245)

## 2015-01-30 MED ORDER — INSULIN ASPART 100 UNIT/ML ~~LOC~~ SOLN
8.0000 [IU] | Freq: Once | SUBCUTANEOUS | Status: AC
  Administered 2015-01-30: 8 [IU] via SUBCUTANEOUS
  Filled 2015-01-30: qty 0.08

## 2015-01-30 MED ORDER — FAMOTIDINE IN NACL 20-0.9 MG/50ML-% IV SOLN
20.0000 mg | Freq: Once | INTRAVENOUS | Status: AC
  Administered 2015-01-30: 20 mg via INTRAVENOUS

## 2015-01-30 MED ORDER — ACETAMINOPHEN 325 MG PO TABS
ORAL_TABLET | ORAL | Status: AC
  Filled 2015-01-30: qty 2

## 2015-01-30 MED ORDER — DIPHENHYDRAMINE HCL 50 MG/ML IJ SOLN
50.0000 mg | Freq: Once | INTRAMUSCULAR | Status: AC
  Administered 2015-01-30: 50 mg via INTRAVENOUS

## 2015-01-30 MED ORDER — SODIUM CHLORIDE 0.9 % IV SOLN
Freq: Once | INTRAVENOUS | Status: AC
  Administered 2015-01-30: 11:00:00 via INTRAVENOUS

## 2015-01-30 MED ORDER — DIPHENHYDRAMINE HCL 50 MG/ML IJ SOLN
INTRAMUSCULAR | Status: AC
  Filled 2015-01-30: qty 1

## 2015-01-30 MED ORDER — SODIUM CHLORIDE 0.9 % IJ SOLN
10.0000 mL | INTRAMUSCULAR | Status: DC | PRN
  Administered 2015-01-30: 10 mL
  Filled 2015-01-30: qty 10

## 2015-01-30 MED ORDER — HEPARIN SOD (PORK) LOCK FLUSH 100 UNIT/ML IV SOLN
500.0000 [IU] | Freq: Once | INTRAVENOUS | Status: AC | PRN
  Administered 2015-01-30: 500 [IU]
  Filled 2015-01-30: qty 5

## 2015-01-30 MED ORDER — FAMOTIDINE IN NACL 20-0.9 MG/50ML-% IV SOLN
INTRAVENOUS | Status: AC
  Filled 2015-01-30: qty 50

## 2015-01-30 MED ORDER — SODIUM CHLORIDE 0.9 % IV SOLN
INTRAVENOUS | Status: DC
  Administered 2015-01-30: 11:00:00 via INTRAVENOUS

## 2015-01-30 MED ORDER — ACETAMINOPHEN 325 MG PO TABS
650.0000 mg | ORAL_TABLET | Freq: Once | ORAL | Status: AC
  Administered 2015-01-30: 650 mg via ORAL

## 2015-01-30 MED ORDER — SODIUM CHLORIDE 0.9 % IV SOLN
80.0000 mg/m2 | Freq: Once | INTRAVENOUS | Status: AC
  Administered 2015-01-30: 174 mg via INTRAVENOUS
  Filled 2015-01-30: qty 29

## 2015-01-30 MED ORDER — SODIUM CHLORIDE 0.9 % IV SOLN
Freq: Once | INTRAVENOUS | Status: AC
  Administered 2015-01-30: 11:00:00 via INTRAVENOUS
  Filled 2015-01-30: qty 4

## 2015-01-30 NOTE — Progress Notes (Signed)
Pt. C/O headache.  VO phone Dr. Loni Muse RN for 2 tylenol - 325mg  each.

## 2015-01-30 NOTE — Patient Instructions (Signed)
[facility] Discharge Instructions for Patients Receiving Chemotherapy  Today you received the following chemotherapy agent: Taxol   To help prevent nausea and vomiting after your treatment, we encourage you to take your nausea medication as prescribed.    If you develop nausea and vomiting that is not controlled by your nausea medication, call the clinic.   BELOW ARE SYMPTOMS THAT SHOULD BE REPORTED IMMEDIATELY:  *FEVER GREATER THAN 100.5 F  *CHILLS WITH OR WITHOUT FEVER  NAUSEA AND VOMITING THAT IS NOT CONTROLLED WITH YOUR NAUSEA MEDICATION  *UNUSUAL SHORTNESS OF BREATH  *UNUSUAL BRUISING OR BLEEDING  TENDERNESS IN MOUTH AND THROAT WITH OR WITHOUT PRESENCE OF ULCERS  *URINARY PROBLEMS  *BOWEL PROBLEMS  UNUSUAL RASH Items with * indicate a potential emergency and should be followed up as soon as possible.  Feel free to call the clinic you have any questions or concerns. The clinic phone number is [phone].  Please show the CHEMO ALERT CARD at check-in to the Emergency Department and triage nurse.   

## 2015-01-30 NOTE — Telephone Encounter (Signed)
Gave and printed appt sched and avs for pt for June and July  °

## 2015-01-30 NOTE — Progress Notes (Signed)
1 week [facility]  [phone] Fax:([phone]) [place] Note   Patient Care Team: Seward Carol, MD as PCP - General (Internal Medicine) Erroll Luna, MD as Consulting Physician (General Surgery) Truitt Merle, MD as Consulting Physician (Hematology) Thea Silversmith, MD as Consulting Physician (Radiation Oncology) Rockwell Germany, RN as Registered Nurse Mauro Kaufmann, RN as Registered Nurse 01/30/2015  CHIEF COMPLAINTS  Follow up breast cancer   Oncology History   Breast cancer of upper-inner quadrant of left female breast   Staging form: Breast, AJCC 7th Edition     Clinical stage from 10/08/2014: Stage IIB (T2, N1, M0) - Unsigned     Pathologic: Stage IIB (T2, N1, cM0) - Unsigned       Breast cancer of upper-inner quadrant of left female breast   09/26/2014 Breast US 2.5 cm irregular mass in the upper inner left breast with adjacent 1.1 cm satellite mass and enlarged left axillary lymph nodes, highly suspicious for left breast malignancy and left axillary lymph node metastases. Tissue sampling is recommended.    09/26/2014 Mammogram Spot compression views of the left breast and routine views of both breasts demonstrate a 2 x 2.5 cm irregular mass in the upper inner left breast.    09/29/2014 Initial Biopsy 1. Breast, left, needle core biopsy, mass, 11 o'clock - INVASIVE MAMMARY CARCINOMA, SEE COMMENT. - MAMMARY CARCINOMA IN SITU. 2. Lymph node, needle/core biopsy, left axillary - ONE LYMPH NODE, POSITIVE FOR METASTATIC MAMMARY CARCINOMA (1/1).   09/29/2014 Receptors her2 Estrogen Receptor: 0%, NEGATIVE Progesterone Receptor: 0%, NEGATIVE, HER2 negative,  Proliferation Marker Ki67: 93%   10/01/2014 Initial Diagnosis Breast cancer of upper-inner quadrant of left female breast   10/17/2014 Imaging CT and bone scan negative for distant metastasis    10/24/2014 -  Adjuvant Chemotherapy ddACx4 then weekly Taxol X12     HISTORY OF PRESENTING ILLNESS:    Ashlee Hill 54 y.o. female presents to our multidisciplinary breast clinic today to discuss the management of her newly diagnosed breast cancer  She noticed a left breast lump in August 2015, no tenderness, skin or nipple change. She otherwise felt well. She did not seek immediate medical attention due to lack of insurance. She finally got her insurance approved and saw her primary care physician recently. She was referred for mammogram which showed a 2.5 cm irregular mass in the upper inner left breast. She underwent left breast mass and axillary node biopsy on 09/29/2014 and both biopsy showed invasive ductal carcinoma, ER negative, PR negative, HER-2 negative.  She otherwise feels well, no pain or ther complains. She has good appetite and her weight is stable.  CURRENT THERAPY: Neoadjuvant chemotherapy with ddAC every 2 weeks x4, followed by weekly Taxol 12, started on 10/24/2058, Taxol started on 12/26/14  INTERIM HISTORY; [name] returns for follow-up and 6th dose of Taxol. She required blood transfusion early this week due to symptomatic anemia. She has been seeing Dr. Delfina Redwood also for her DM, her BG is usually in rage of 120-230. No neuroapthy. Her mucositis is moderate, she uses magic mouth wash. She had T100.3 2 days ago after blood transfusion, and resolved after a few hours.   MEDICAL HISTORY:  Past Medical History  Diagnosis Date  . Breast cancer of upper-inner quadrant of left female breast 10/01/2014  . Hypertension   . Family history of breast cancer   . Wears glasses     driving  . Diabetes mellitus without complication 3/32/95  chemo caused diabetes per pt     SURGICAL HISTORY: Past Surgical History  Procedure Laterality Date  . Tonsillectomy    . Wisdom tooth extraction    . Portacath placement Right 10/21/2014    Procedure: INSERTION PORT-A-CATH;  Surgeon: Erroll Luna, MD;  Location: [place];  Service: General;  Laterality: Right;     GYN  HISTORY  Menarchal: 12 LMP: 09/27/2014 Contraceptive: no HRT:  G0P0    SOCIAL HISTORY: History   Social History  . Marital Status: Single    Spouse Name: N/A  . Number of Children: N/A  . Years of Education: N/A   Occupational History  . Not on file.   Social History Main Topics  . Smoking status: Never Smoker   . Smokeless tobacco: Never Used  . Alcohol Use: 0.6 oz/week    1 Glasses of wine per week     Comment: socail drinker  . Drug Use: No  . Sexual Activity: Not on file   Other Topics Concern  . Not on file   Social History Narrative    FAMILY HISTORY: Family History  Problem Relation Age of Onset  . Prostate cancer Father 52    currently 36  . Breast cancer Paternal Aunt 25    deceased 36  . Lung cancer Maternal Grandfather 26    smoker; deceased  . Thyroid cancer Cousin 85    pat first cousin; currently 55     Father had prostate cancer at age of 21 Paternal aunt had breast caner in her 69's Paternal cousin had thyroid cancer at age of before 48 Maternal grandfather had lung cancer    ALLERGIES:  is allergic to tegaderm ag mesh.  MEDICATIONS:  Current Outpatient Prescriptions  Medication Sig Dispense Refill  . ALPRAZolam (XANAX) 1 MG tablet Take 1-2 mg by mouth 2 (two) times daily. Take 1 tablet (1 mg) in the morning & Take 2 tablets (2 mg) in the evening.    . Alum & Mag Hydroxide-Simeth (MAGIC MOUTHWASH W/LIDOCAINE) SOLN Take 5 mLs by mouth 4 (four) times daily as needed for mouth pain. 120 mL 1  . atorvastatin (LIPITOR) 10 MG tablet Take 10 mg by mouth daily.    . B-D UF III MINI PEN NEEDLES 31G X 5 MM MISC   0  . calcium carbonate (OS-CAL) 600 MG TABS tablet Take 600 mg by mouth daily with breakfast.    . dexamethasone (DECADRON) 4 MG tablet 1/2 tablet (2 mg) Q AM for nausea PRN. 10 tablet 0  . gabapentin (NEURONTIN) 600 MG tablet Take 600 mg by mouth at bedtime. As needed for sleep or anxiety    . hydrochlorothiazide (HYDRODIURIL) 25 MG  tablet Take 25 mg by mouth daily.     . insulin aspart (NOVOLOG) 100 UNIT/ML injection Inject into the skin 3 (three) times daily before meals. Just started-sliding scale per Dr Delfina Redwood    . LANTUS SOLOSTAR 100 UNIT/ML Solostar Pen Inject 30 Units into the skin at bedtime.   0  . LORazepam (ATIVAN) 1 MG tablet Take 1 tablet (1 mg total) by mouth at bedtime as needed for sleep. 30 tablet 0  . menthol-cetylpyridinium (CEPACOL) 3 MG lozenge Take 1 lozenge by mouth every 2 (two) hours as needed for sore throat (sore throat).    . metoprolol (LOPRESSOR) 100 MG tablet Take 100 mg by mouth daily.    . ondansetron (ZOFRAN) 8 MG tablet Take 1 tablet (8 mg total) by mouth every  8 (eight) hours as needed for nausea. Start on the third day after chemotherapy. 30 tablet 1  . ONE TOUCH ULTRA TEST test strip TEST BLOOD SUGAR AS DIRECTED THREE TIMES A DAY  0  . ONETOUCH DELICA LANCETS 50P MISC use to STICK FINGER three times a day as directed  0  . oxyCODONE-acetaminophen (ROXICET) 5-325 MG per tablet Take 1-2 tablets by mouth every 4 (four) hours as needed. 30 tablet 0  . PRESCRIPTION MEDICATION Chemo [facility]    . prochlorperazine (COMPAZINE) 10 MG tablet Take 1 tablet (10 mg total) by mouth every 6 (six) hours as needed for nausea or vomiting. 30 tablet 1  . venlafaxine XR (EFFEXOR-XR) 150 MG 24 hr capsule Take 150 mg by mouth daily with breakfast.    . aspirin 81 MG tablet Take 81 mg by mouth daily.     No current facility-administered medications for this visit.    REVIEW OF SYSTEMS:   Constitutional: Denies fevers, chills or abnormal night sweats Eyes: Denies blurriness of vision, double vision or watery eyes Ears, nose, mouth, throat, and face: resolving mucositis and sore throat Respiratory: Denies cough, dyspnea or wheezes Cardiovascular: Denies palpitation, chest discomfort or lower extremity swelling Gastrointestinal:  Denies nausea, heartburn or change in bowel habits Skin: Denies abnormal skin  rashes Lymphatics: Denies new lymphadenopathy or easy bruising Neurological:Denies numbness, tingling or new weaknesses Behavioral/Psych: Mood is stable, no new changes  All other systems were reviewed with the patient and are negative.  PHYSICAL EXAMINATION: ECOG PERFORMANCE STATUS: 1  Filed Vitals:   01/30/15 0916  BP: 159/77  Pulse: 119  Temp: 98.5 F (36.9 C)  Resp: 20   Filed Weights   01/30/15 0916  Weight: 231 lb (104.781 kg)    GENERAL:alert, no distress and comfortable SKIN: skin color, texture, turgor are normal, no rashes or significant lesions. Port site looks clean, (+) skin peeling on the bottom of left feet EYES: normal, conjunctiva are pink and non-injected, sclera clear OROPHARYNX:no exudate, no erythema and lips, buccal mucosa, and tongue normal  NECK: supple, thyroid normal size, non-tender, without nodularity LYMPH:  no palpable lymphadenopathy in the cervical, axillary or inguinal LUNGS: clear to auscultation and percussion with normal breathing effort HEART: regular rate & rhythm and no murmurs and no lower extremity edema ABDOMEN:abdomen soft, non-tender and normal bowel sounds Musculoskeletal:no cyanosis of digits and no clubbing  PSYCH: alert & oriented x 3 with fluent speech NEURO: no focal motor/sensory deficits Breasts: Breast inspection showed them to be symmetrical with no nipple discharge. Palpation of the left breasts showed a <1cm residual small mass in the upper mid quadrant (smaller than before), no palpable axillary lymph node. Exam of the right breast and axillary was normal.    LABORATORY DATA:  I have reviewed the data as listed CBC Latest Ref Rng 01/30/2015 01/27/2015 01/23/2015  WBC 3.9 - 10.3 10e3/uL 4.3 3.2(L) 4.2  Hemoglobin 11.6 - 15.9 g/dL 10.1(L) 7.3(L) 8.2(L)  Hematocrit 34.8 - 46.6 % 29.1(L) 21.5(L) 23.9(L)  Platelets 145 - 400 10e3/uL 223 169 210     Recent Labs  12/04/14 1134 12/04/14 1820 12/05/14 0520 12/06/14 0530   01/23/15 1351 01/27/15 1046 01/30/15 0857  NA 129*  --  138 138  < > 138 138 137  K 3.6  --  3.3* 4.1  < > 4.0 4.2 4.2  CL 91*  --  103 106  --   --   --   --   CO2 25  --  27 24  < > 24 27 19*  GLUCOSE 422*  --  227* 204*  < > 170* 218* 374*  BUN 10  --  7 <5*  < > 7.8 18.4 11.7  CREATININE 1.27*  --  1.03 0.79  < > 1.0 0.9 0.8  CALCIUM 8.8  --  8.1* 7.7*  < > 9.2 9.4 8.9  GFRNONAA 48*  --  61* >90  --   --   --   --   GFRAA 55*  --  71* >90  --   --   --   --   PROT 7.5  --  5.9*  --   < > 7.1 6.9 7.2  ALBUMIN 4.5  --  3.3*  --   < > 3.9 3.8 3.8  AST 36  --  28  --   < > 28 25 53*  ALT 28  --  22  --   < > 26 22 48  ALKPHOS 92  --  62  --   < > 100 100 115  BILITOT 1.3* 0.9 0.8  --   < > 0.88 1.05 1.00  BILIDIR  --  0.2  --   --   --   --   --   --   IBILI  --  0.7  --   --   --   --   --   --   < > = values in this interval not displayed.  PATHOLOGY REPORT 09/26/2014 Diagnosis 1. Breast, left, needle core biopsy, mass, 11 o'clock - INVASIVE MAMMARY CARCINOMA, SEE COMMENT. - MAMMARY CARCINOMA IN SITU. 2. Lymph node, needle/core biopsy, left axillary - ONE LYMPH NODE, POSITIVE FOR METASTATIC MAMMARY CARCINOMA (1/1). Microscopic Comment 1. Although grade of tumor is best assessed at resection, with these biopsies, both the in situ and invasive carcinoma are grade II. The invasive carcinoma demonstrates strong diffuse E-cadherin expression; supporting a ductal phenotype. 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  Estrogen Receptor: 0%, NEGATIVE Progesterone Receptor: 0%, NEGATIVE Proliferation Marker Ki67: 93% 1. A sample was sent to [facility] for HER-2 testing by FISH. The results are as follows: Negative.  RADIOGRAPHIC STUDIES: I have personally reviewed the radiological images as listed and agreed with the findings in the report.  Mr  Breast Bilateral W Wo Contrast 10/08/2014    IMPRESSION: 1. Biopsy proven malignancy in the upper inner left breast with adjacent/contiguous areas of nodularity measures up to 3.5 cm, with multiple morphologically abnormal axillary lymph nodes compatible with known axillary metastases.  2. No MRI evidence of malignancy in the right breast.  RECOMMENDATION: Treatment plan for known left breast malignancy.  BI-RADS CATEGORY  6: Known biopsy-proven malignancy.   Electronically Signed   By: Everlean Alstrom M.D.   On: 10/08/2014 10:29   US Breast Ltd Uni Left Inc Axilla 09/26/2014    IMPRESSION: 2.5 cm irregular mass in the upper inner left breast with adjacent 1.1 cm satellite mass and enlarged left axillary lymph nodes, highly suspicious for left breast malignancy and left axillary lymph node metastases. Tissue sampling is recommended.  No mammographic evidence of right breast malignancy.    CT chest, abdomen and pelvis with contrast 10/17/2014 IMPRESSION: 1. Left breast lesion is identified compatible with the clinical history of breast cancer. 2. Enlarged left axillary lymph nodes suspicious for metastatic adenopathy. 3. No specific features identified  to suggest distant metastatic disease. 4. Hepatic steatosis.  Bone scan 10/17/2014 IMPRESSION: 1. The uptake pattern within the skeleton is not highly suspicious for malignancy. However, subtle increased uptake in the midshaft of the right humerus and the lower lumbar spine posteriorly merits further evaluation with plain films. 2. The uptake over the lower extremities is consistent with degenerative change.  Lumbar spine X-ray 10/20/14 IMPRESSION: No lytic or sclerotic osseous lesion.  Probable degenerative facet osteoarthritic change at L4-L5 and L5-S1 which may correspond to the area of abnormal uptake at recent bone scan.  RIGHT HUMERUS X-RAY 10/20/2014 IMPRESSION: No abnormality seen to correspond to abnormal uptake seen on  bone scan   ASSESSMENT & PLAN:  54 year old pre-menopausal female with past medical history of hypertension, presented with a palpable left breast mass.  1. Left breast invasive ductal carcinoma, cT2N1M0, stage IIB, triple negative  -I discussed her all imaging finding and biopsy results extensively with patient. Her ultrasound and MRI breast reviewed multiple left axillary lymph nodes, at least N1 disease, possible N2. I discussed with her and she has locally advanced disease, and triple-negative breast cancer tends to be more aggressive with early metastasis and cancer recurrence after surgery. -a staging CT showed no evidence of distant metastasis, the subtle increased uptake in the right humerus and lower lumbar spine on the bone scan was further evaluated by x-ray, which were negative. - I recommend neoadjuvant chemotherapy given her very high risk of cancer recurrence after surgery alone. -She has no history of cardiac disease, normal EF on echo,  I would recommend dose dense Adriamycin and Cytoxan followed by paclitaxel.  -Given her young age and triple negative disease, positive family history, she was referred to see genetic counselor for genetic testing, her genetic testing was negative.  -Lab reviewed, adequate for treatment, we will continue weekly paclitaxel today.  2. Infertility -We discussed she may experience menopause with chemotherapy, the potentially not able to be pregnant afterwards. -She does not desire to have children in the future. -She has not had menstrual periods since she started chemotherapy  3.Anxiety and coping -she will take her xanax needed -I encouraged her to think positive -She has good support from her parents and friend.   4. Nausea, vomiting, anorexia -secondary to chemotherapy, better with taxol now  -She knows to take Compazine and Zofran as needed -I encouraged her to eat small female but for more frequent, and take nutrition supplement  5.  Newly diagnosed type 2 diabetes -Continue Lantus and insulin sliding scale, follow-up with her primary care physician. -She is on low-dose dexamethasone 5 mg before Taxol, steroids induced hyperglycemia discussed. -Due to her hyperglycemia (secondary to incidental dexamethasone last night), I'll give her 500 mL normal saline and extra dose insulin at infusion today.   6. Cytopenia secondary to chemo -She received a blood transfusion after the third cycle AC and 5th cycle Taxol  -We'll obtain lab to ruled out hemolysis, or nutritional anemia also.  Plan -Continue weekly Taxol, week 6 today. -We'll schedule her blood transfusion in 3 weeks if hemoglobin drops below 8 -We'll see her every week when she is on chemotherapy.  All questions were answered. The patient knows to call the clinic with any problems, questions or concerns. I spent 20 minutes counseling the patient face to face. The total time spent in the appointment was 25 minutes and more than 50% was on counseling.     Truitt Merle, MD 01/30/2015 9:50 AM

## 2015-02-01 ENCOUNTER — Other Ambulatory Visit: Payer: Self-pay | Admitting: Hematology

## 2015-02-01 DIAGNOSIS — D630 Anemia in neoplastic disease: Secondary | ICD-10-CM

## 2015-02-01 LAB — VITAMIN B12: Vitamin B-12: 397 pg/mL (ref 211–911)

## 2015-02-01 LAB — FOLATE RBC: RBC Folate: 2186 ng/mL (ref >280)

## 2015-02-01 LAB — HAPTOGLOBIN: Haptoglobin: 15 mg/dL — ABNORMAL LOW (ref 43–212)

## 2015-02-05 ENCOUNTER — Other Ambulatory Visit: Payer: Self-pay | Admitting: *Deleted

## 2015-02-05 DIAGNOSIS — C50212 Malignant neoplasm of upper-inner quadrant of left female breast: Secondary | ICD-10-CM

## 2015-02-06 ENCOUNTER — Other Ambulatory Visit (HOSPITAL_BASED_OUTPATIENT_CLINIC_OR_DEPARTMENT_OTHER): Payer: 59

## 2015-02-06 ENCOUNTER — Ambulatory Visit (HOSPITAL_BASED_OUTPATIENT_CLINIC_OR_DEPARTMENT_OTHER): Payer: 59

## 2015-02-06 ENCOUNTER — Ambulatory Visit (HOSPITAL_BASED_OUTPATIENT_CLINIC_OR_DEPARTMENT_OTHER): Payer: 59 | Admitting: Nurse Practitioner

## 2015-02-06 ENCOUNTER — Other Ambulatory Visit: Payer: Self-pay | Admitting: Hematology

## 2015-02-06 VITALS — BP 142/78 | HR 88 | Temp 98.3°F | Resp 18 | Ht 65.0 in | Wt 229.1 lb

## 2015-02-06 DIAGNOSIS — D6481 Anemia due to antineoplastic chemotherapy: Secondary | ICD-10-CM

## 2015-02-06 DIAGNOSIS — C50212 Malignant neoplasm of upper-inner quadrant of left female breast: Secondary | ICD-10-CM

## 2015-02-06 DIAGNOSIS — D630 Anemia in neoplastic disease: Secondary | ICD-10-CM

## 2015-02-06 DIAGNOSIS — R739 Hyperglycemia, unspecified: Secondary | ICD-10-CM

## 2015-02-06 DIAGNOSIS — Z5111 Encounter for antineoplastic chemotherapy: Secondary | ICD-10-CM | POA: Diagnosis not present

## 2015-02-06 DIAGNOSIS — C773 Secondary and unspecified malignant neoplasm of axilla and upper limb lymph nodes: Secondary | ICD-10-CM | POA: Diagnosis not present

## 2015-02-06 DIAGNOSIS — Z171 Estrogen receptor negative status [ER-]: Secondary | ICD-10-CM

## 2015-02-06 DIAGNOSIS — D649 Anemia, unspecified: Secondary | ICD-10-CM

## 2015-02-06 LAB — COMPREHENSIVE METABOLIC PANEL (CC13)
ALT: 41 U/L (ref 0–55)
AST: 31 U/L (ref 5–34)
Albumin: 3.7 g/dL (ref 3.5–5.0)
Alkaline Phosphatase: 102 U/L (ref 40–150)
Anion Gap: 9 mEq/L (ref 3–11)
BUN: 19.3 mg/dL (ref 7.0–26.0)
CALCIUM: 9.4 mg/dL (ref 8.4–10.4)
CHLORIDE: 106 meq/L (ref 98–109)
CO2: 24 meq/L (ref 22–29)
Creatinine: 1 mg/dL (ref 0.6–1.1)
EGFR: 68 mL/min/{1.73_m2} — ABNORMAL LOW (ref >90)
GLUCOSE: 174 mg/dL — AB (ref 70–140)
POTASSIUM: 4 meq/L (ref 3.5–5.1)
SODIUM: 139 meq/L (ref 136–145)
TOTAL PROTEIN: 6.4 g/dL (ref 6.4–8.3)
Total Bilirubin: 0.8 mg/dL (ref 0.20–1.20)

## 2015-02-06 LAB — CBC WITH DIFFERENTIAL/PLATELET
BASO%: 0.7 % (ref 0.0–2.0)
BASOS ABS: 0 10*3/uL (ref 0.0–0.1)
EOS%: 1.7 % (ref 0.0–7.0)
Eosinophils Absolute: 0.1 10*3/uL (ref 0.0–0.5)
HEMATOCRIT: 25.5 % — AB (ref 34.8–46.6)
HEMOGLOBIN: 8.6 g/dL — AB (ref 11.6–15.9)
LYMPH#: 0.7 10*3/uL — AB (ref 0.9–3.3)
LYMPH%: 25.5 % (ref 14.0–49.7)
MCH: 34.8 pg — ABNORMAL HIGH (ref 25.1–34.0)
MCHC: 33.7 g/dL (ref 31.5–36.0)
MCV: 103.2 fL — AB (ref 79.5–101.0)
MONO#: 0.2 10*3/uL (ref 0.1–0.9)
MONO%: 5.9 % (ref 0.0–14.0)
NEUT%: 66.2 % (ref 38.4–76.8)
NEUTROS ABS: 1.9 10*3/uL (ref 1.5–6.5)
Platelets: 190 10*3/uL (ref 145–400)
RBC: 2.47 10*6/uL — ABNORMAL LOW (ref 3.70–5.45)
RDW: 25 % — ABNORMAL HIGH (ref 11.2–14.5)
WBC: 2.9 10*3/uL — ABNORMAL LOW (ref 3.9–10.3)

## 2015-02-06 MED ORDER — SODIUM CHLORIDE 0.9 % IV SOLN
80.0000 mg/m2 | Freq: Once | INTRAVENOUS | Status: AC
  Administered 2015-02-06: 174 mg via INTRAVENOUS
  Filled 2015-02-06: qty 29

## 2015-02-06 MED ORDER — FAMOTIDINE IN NACL 20-0.9 MG/50ML-% IV SOLN
INTRAVENOUS | Status: AC
  Filled 2015-02-06: qty 50

## 2015-02-06 MED ORDER — SODIUM CHLORIDE 0.9 % IV SOLN
Freq: Once | INTRAVENOUS | Status: AC
  Administered 2015-02-06: 10:00:00 via INTRAVENOUS

## 2015-02-06 MED ORDER — DIPHENHYDRAMINE HCL 50 MG/ML IJ SOLN
INTRAMUSCULAR | Status: AC
  Filled 2015-02-06: qty 1

## 2015-02-06 MED ORDER — FAMOTIDINE IN NACL 20-0.9 MG/50ML-% IV SOLN
20.0000 mg | Freq: Once | INTRAVENOUS | Status: AC
  Administered 2015-02-06: 20 mg via INTRAVENOUS

## 2015-02-06 MED ORDER — SODIUM CHLORIDE 0.9 % IJ SOLN
10.0000 mL | INTRAMUSCULAR | Status: DC | PRN
  Administered 2015-02-06: 10 mL
  Filled 2015-02-06: qty 10

## 2015-02-06 MED ORDER — SODIUM CHLORIDE 0.9 % IV SOLN
Freq: Once | INTRAVENOUS | Status: AC
  Administered 2015-02-06: 10:00:00 via INTRAVENOUS
  Filled 2015-02-06: qty 4

## 2015-02-06 MED ORDER — DIPHENHYDRAMINE HCL 50 MG/ML IJ SOLN
50.0000 mg | Freq: Once | INTRAMUSCULAR | Status: AC
  Administered 2015-02-06: 50 mg via INTRAVENOUS

## 2015-02-06 MED ORDER — HEPARIN SOD (PORK) LOCK FLUSH 100 UNIT/ML IV SOLN
500.0000 [IU] | Freq: Once | INTRAVENOUS | Status: AC | PRN
  Administered 2015-02-06: 500 [IU]
  Filled 2015-02-06: qty 5

## 2015-02-06 NOTE — Patient Instructions (Signed)
Cancer Center Discharge Instructions for Patients Receiving Chemotherapy  Today you received the following chemotherapy agents:  Taxol  To help prevent nausea and vomiting after your treatment, we encourage you to take your nausea medication as prescribed.   If you develop nausea and vomiting that is not controlled by your nausea medication, call the clinic.   BELOW ARE SYMPTOMS THAT SHOULD BE REPORTED IMMEDIATELY:  *FEVER GREATER THAN 100.5 F  *CHILLS WITH OR WITHOUT FEVER  NAUSEA AND VOMITING THAT IS NOT CONTROLLED WITH YOUR NAUSEA MEDICATION  *UNUSUAL SHORTNESS OF BREATH  *UNUSUAL BRUISING OR BLEEDING  TENDERNESS IN MOUTH AND THROAT WITH OR WITHOUT PRESENCE OF ULCERS  *URINARY PROBLEMS  *BOWEL PROBLEMS  UNUSUAL RASH Items with * indicate a potential emergency and should be followed up as soon as possible.  Feel free to call the clinic you have any questions or concerns. The clinic phone number is [phone].  Please show the CHEMO ALERT CARD at check-in to the Emergency Department and triage nurse.   

## 2015-02-06 NOTE — Progress Notes (Signed)
[facility] OFFICE PROGRESS NOTE   Diagnosis: Breast cancer Oncology History   Breast cancer of upper-inner quadrant of left female breast  Staging form: Breast, AJCC 7th Edition  Clinical stage from 10/08/2014: Stage IIB (T2, N1, M0) - Unsigned  Pathologic: Stage IIB (T2, N1, cM0) - Unsigned       Breast cancer of upper-inner quadrant of left female breast   09/26/2014 Breast US 2.5 cm irregular mass in the upper inner left breast with adjacent 1.1 cm satellite mass and enlarged left axillary lymph nodes, highly suspicious for left breast malignancy and left axillary lymph node metastases. Tissue sampling is recommended.    09/26/2014 Mammogram Spot compression views of the left breast and routine views of both breasts demonstrate a 2 x 2.5 cm irregular mass in the upper inner left breast.    09/29/2014 Initial Biopsy 1. Breast, left, needle core biopsy, mass, 11 o'clock - INVASIVE MAMMARY CARCINOMA, SEE COMMENT. - MAMMARY CARCINOMA IN SITU. 2. Lymph node, needle/core biopsy, left axillary - ONE LYMPH NODE, POSITIVE FOR METASTATIC MAMMARY CARCINOMA (1/1).   09/29/2014 Receptors her2 Estrogen Receptor: 0%, NEGATIVE Progesterone Receptor: 0%, NEGATIVE Proliferation Marker Ki67: 93%   10/01/2014 Initial Diagnosis Breast cancer of upper-inner quadrant of left female breast   10/17/2014 Imaging CT and bone scan negative for distant metastasis    10/24/2014 -  Adjuvant Chemotherapy ddACx4 then weekly Taxol X12                     INTERVAL HISTORY:   Ashlee Hill returns as scheduled. She completed cycle 6 weekly Taxol 01/30/2015. She denies nausea/vomiting. No mouth sores. No diarrhea. She continues to have intermittent constipation which is relieved with Dulcolax. No numbness or tingling in her hands or feet. Peeling on her feet has resolved.  She continues to have epistaxis with nose blowing. The inside of her nose is sore. She is using a saline nasal rinse and ointment. Blood sugars overall well-controlled.  Objective:  Vital signs in last 24 hours:  Blood pressure 142/78, pulse 88, temperature 98.3 F (36.8 C), temperature source Oral, resp. rate 18, height '5\' 5"'  (1.651 m), weight 229 lb 1.6 oz (103.919 kg), SpO2 98 %.    HEENT: No thrush or ulcers. Dry blood present within the nasal passages. Resp: Lungs clear bilaterally. Cardio: Regular rate and rhythm. GI: Abdomen soft and nontender. No hepatomegaly. Vascular: No leg edema. Calves soft and nontender. Neuro: Vibratory sense intact over the fingertips per tuning fork exam.  Skin: No rash. Soles with mild dryness. No erythema or skin breakdown. Port-A-Cath without erythema.    Lab Results:  Lab Results  Component Value Date   WBC 2.9* 02/06/2015   HGB 8.6* 02/06/2015   HCT 25.5* 02/06/2015   MCV 103.2* 02/06/2015   PLT 190 02/06/2015   NEUTROABS 1.9 02/06/2015    Imaging:  No results found.  Medications: I have reviewed the patient's current medications.  Assessment/Plan: 1. Left breast invasive ductal carcinoma, cT2N1M0, stage IIB, triple negative diagnosed 09/29/2014. Neoadjuvant dose dense Adriamycin/Cytoxan initiated 10/24/2014 with 4 cycles planned to be followed by Taxol weekly 12. Cycle 4 dose dense Adriamycin/Cytoxan completed 12/12/2014. Weekly Taxol initiated 12/26/2014. 2. History of mucositis related to chemotherapy. 3. Question hand-foot syndrome following cycle 3 Adriamycin/Cytoxan. Resolved. 4. Hyperglycemia. Now on insulin. 5. Hospitalization 12/04/2014 through 12/06/2014 with nausea/vomiting/dehydration and hyperglycemia. 6. Anemia secondary to chemotherapy. Status post red cell transfusion 01/07/2015, 01/27/2015. 7. Rash right chest wall. Likely contact/allergic dermatitis.  Resolved. 8. Epistaxis. Question etiology.   Disposition:  [name] appears stable. She has completed 6 cycles of weekly Taxol. Plan to proceed with week 7 today as scheduled. She will return for a follow-up visit and week 8 on 02/13/2015. She will contact the office in the interim with any problems.  We will go ahead and schedule an appointment for a blood transfusion on 02/13/2015.  Ned Card ANP/GNP-BC   02/06/2015  9:23 AM

## 2015-02-07 LAB — DIRECT ANTIGLOBULIN RFX ANTI-C3/IGG: DAT, Polyspecific: NEGATIVE

## 2015-02-13 ENCOUNTER — Ambulatory Visit (HOSPITAL_BASED_OUTPATIENT_CLINIC_OR_DEPARTMENT_OTHER): Payer: 59 | Admitting: Hematology

## 2015-02-13 ENCOUNTER — Ambulatory Visit: Payer: 59

## 2015-02-13 ENCOUNTER — Ambulatory Visit (HOSPITAL_BASED_OUTPATIENT_CLINIC_OR_DEPARTMENT_OTHER): Payer: 59

## 2015-02-13 ENCOUNTER — Other Ambulatory Visit (HOSPITAL_BASED_OUTPATIENT_CLINIC_OR_DEPARTMENT_OTHER): Payer: 59

## 2015-02-13 ENCOUNTER — Encounter: Payer: Self-pay | Admitting: Hematology

## 2015-02-13 ENCOUNTER — Encounter: Payer: Self-pay | Admitting: *Deleted

## 2015-02-13 VITALS — BP 136/82 | HR 98 | Temp 98.4°F | Resp 20 | Ht 65.0 in | Wt 228.4 lb

## 2015-02-13 DIAGNOSIS — D61810 Antineoplastic chemotherapy induced pancytopenia: Secondary | ICD-10-CM

## 2015-02-13 DIAGNOSIS — R112 Nausea with vomiting, unspecified: Secondary | ICD-10-CM

## 2015-02-13 DIAGNOSIS — E119 Type 2 diabetes mellitus without complications: Secondary | ICD-10-CM

## 2015-02-13 DIAGNOSIS — R630 Anorexia: Secondary | ICD-10-CM

## 2015-02-13 DIAGNOSIS — Z171 Estrogen receptor negative status [ER-]: Secondary | ICD-10-CM

## 2015-02-13 DIAGNOSIS — D649 Anemia, unspecified: Secondary | ICD-10-CM

## 2015-02-13 DIAGNOSIS — C773 Secondary and unspecified malignant neoplasm of axilla and upper limb lymph nodes: Secondary | ICD-10-CM

## 2015-02-13 DIAGNOSIS — Z5111 Encounter for antineoplastic chemotherapy: Secondary | ICD-10-CM

## 2015-02-13 DIAGNOSIS — Z1379 Encounter for other screening for genetic and chromosomal anomalies: Secondary | ICD-10-CM

## 2015-02-13 DIAGNOSIS — C50212 Malignant neoplasm of upper-inner quadrant of left female breast: Secondary | ICD-10-CM | POA: Diagnosis not present

## 2015-02-13 DIAGNOSIS — F419 Anxiety disorder, unspecified: Secondary | ICD-10-CM

## 2015-02-13 LAB — CBC WITH DIFFERENTIAL/PLATELET
BASO%: 1.2 % (ref 0.0–2.0)
Basophils Absolute: 0 10*3/uL (ref 0.0–0.1)
EOS%: 1.5 % (ref 0.0–7.0)
Eosinophils Absolute: 0.1 10*3/uL (ref 0.0–0.5)
HCT: 26.1 % — ABNORMAL LOW (ref 34.8–46.6)
HGB: 9 g/dL — ABNORMAL LOW (ref 11.6–15.9)
LYMPH%: 23.4 % (ref 14.0–49.7)
MCH: 35.4 pg — AB (ref 25.1–34.0)
MCHC: 34.5 g/dL (ref 31.5–36.0)
MCV: 102.8 fL — AB (ref 79.5–101.0)
MONO#: 0.4 10*3/uL (ref 0.1–0.9)
MONO%: 12.3 % (ref 0.0–14.0)
NEUT#: 2.1 10*3/uL (ref 1.5–6.5)
NEUT%: 61.6 % (ref 38.4–76.8)
NRBC: 5 % — AB (ref 0–0)
Platelets: 211 10*3/uL (ref 145–400)
RBC: 2.54 10*6/uL — AB (ref 3.70–5.45)
RDW: 24.4 % — ABNORMAL HIGH (ref 11.2–14.5)
WBC: 3.3 10*3/uL — ABNORMAL LOW (ref 3.9–10.3)
lymph#: 0.8 10*3/uL — ABNORMAL LOW (ref 0.9–3.3)

## 2015-02-13 LAB — COMPREHENSIVE METABOLIC PANEL (CC13)
ALT: 39 U/L (ref 0–55)
AST: 37 U/L — ABNORMAL HIGH (ref 5–34)
Albumin: 3.8 g/dL (ref 3.5–5.0)
Alkaline Phosphatase: 121 U/L (ref 40–150)
Anion Gap: 9 mEq/L (ref 3–11)
BILIRUBIN TOTAL: 0.71 mg/dL (ref 0.20–1.20)
BUN: 13.6 mg/dL (ref 7.0–26.0)
CALCIUM: 9.1 mg/dL (ref 8.4–10.4)
CHLORIDE: 102 meq/L (ref 98–109)
CO2: 25 mEq/L (ref 22–29)
CREATININE: 1 mg/dL (ref 0.6–1.1)
EGFR: 64 mL/min/{1.73_m2} — ABNORMAL LOW (ref >90)
Glucose: 166 mg/dl — ABNORMAL HIGH (ref 70–140)
Potassium: 4.2 mEq/L (ref 3.5–5.1)
Sodium: 136 mEq/L (ref 136–145)
Total Protein: 6.8 g/dL (ref 6.4–8.3)

## 2015-02-13 LAB — HOLD TUBE, BLOOD BANK

## 2015-02-13 MED ORDER — SODIUM CHLORIDE 0.9 % IV SOLN
Freq: Once | INTRAVENOUS | Status: AC
  Administered 2015-02-13: 10:00:00 via INTRAVENOUS

## 2015-02-13 MED ORDER — FAMOTIDINE IN NACL 20-0.9 MG/50ML-% IV SOLN
20.0000 mg | Freq: Once | INTRAVENOUS | Status: AC
  Administered 2015-02-13: 20 mg via INTRAVENOUS

## 2015-02-13 MED ORDER — FAMOTIDINE IN NACL 20-0.9 MG/50ML-% IV SOLN
INTRAVENOUS | Status: AC
Start: 2015-02-13 — End: 2015-02-13
  Filled 2015-02-13: qty 50

## 2015-02-13 MED ORDER — SODIUM CHLORIDE 0.9 % IV SOLN
80.0000 mg/m2 | Freq: Once | INTRAVENOUS | Status: AC
  Administered 2015-02-13: 174 mg via INTRAVENOUS
  Filled 2015-02-13: qty 29

## 2015-02-13 MED ORDER — HEPARIN SOD (PORK) LOCK FLUSH 100 UNIT/ML IV SOLN
500.0000 [IU] | Freq: Once | INTRAVENOUS | Status: AC | PRN
  Administered 2015-02-13: 500 [IU]
  Filled 2015-02-13: qty 5

## 2015-02-13 MED ORDER — DIPHENHYDRAMINE HCL 50 MG/ML IJ SOLN
INTRAMUSCULAR | Status: AC
  Filled 2015-02-13: qty 1

## 2015-02-13 MED ORDER — SODIUM CHLORIDE 0.9 % IV SOLN
Freq: Once | INTRAVENOUS | Status: AC
  Administered 2015-02-13: 10:00:00 via INTRAVENOUS
  Filled 2015-02-13: qty 4

## 2015-02-13 MED ORDER — DIPHENHYDRAMINE HCL 50 MG/ML IJ SOLN
50.0000 mg | Freq: Once | INTRAMUSCULAR | Status: AC
  Administered 2015-02-13: 50 mg via INTRAVENOUS

## 2015-02-13 MED ORDER — SODIUM CHLORIDE 0.9 % IJ SOLN
10.0000 mL | INTRAMUSCULAR | Status: DC | PRN
  Administered 2015-02-13: 10 mL
  Filled 2015-02-13: qty 10

## 2015-02-13 NOTE — Progress Notes (Addendum)
1 week [facility]  Telephone:([phone] Fax:([phone]) [place] Note   Patient Care Team: Seward Carol, MD as PCP - General (Internal Medicine) Erroll Luna, MD as Consulting Physician (General Surgery) Truitt Merle, MD as Consulting Physician (Hematology) Thea Silversmith, MD as Consulting Physician (Radiation Oncology) Rockwell Germany, RN as Registered Nurse Mauro Kaufmann, RN as Registered Nurse 02/13/2015  CHIEF COMPLAINTS  Follow up breast cancer   Oncology History   Breast cancer of upper-inner quadrant of left female breast   Staging form: Breast, AJCC 7th Edition     Clinical stage from 10/08/2014: Stage IIB (T2, N1, M0) - Unsigned     Pathologic: Stage IIB (T2, N1, cM0) - Unsigned       Breast cancer of upper-inner quadrant of left female breast   09/26/2014 Breast US 2.5 cm irregular mass in the upper inner left breast with adjacent 1.1 cm satellite mass and enlarged left axillary lymph nodes, highly suspicious for left breast malignancy and left axillary lymph node metastases. Tissue sampling is recommended.    09/26/2014 Mammogram Spot compression views of the left breast and routine views of both breasts demonstrate a 2 x 2.5 cm irregular mass in the upper inner left breast.    09/29/2014 Initial Biopsy 1. Breast, left, needle core biopsy, mass, 11 o'clock - INVASIVE MAMMARY CARCINOMA, SEE COMMENT. - MAMMARY CARCINOMA IN SITU. 2. Lymph node, needle/core biopsy, left axillary - ONE LYMPH NODE, POSITIVE FOR METASTATIC MAMMARY CARCINOMA (1/1).   09/29/2014 Receptors her2 Estrogen Receptor: 0%, NEGATIVE Progesterone Receptor: 0%, NEGATIVE, HER2 negative,  Proliferation Marker Ki67: 93%   10/01/2014 Initial Diagnosis Breast cancer of upper-inner quadrant of left female breast   10/17/2014 Imaging CT and bone scan negative for distant metastasis    10/24/2014 -  Adjuvant Chemotherapy ddACx4 then weekly Taxol X12   11/14/2014 Miscellaneous Genetic testing  BreastNext @ [facility] was negative except 2 variants with unknown significance      HISTORY OF PRESENTING ILLNESS:  Ashlee Hill 54 y.o. female presents to our multidisciplinary breast clinic today to discuss the management of her newly diagnosed breast cancer  She noticed a left breast lump in August 2015, no tenderness, skin or nipple change. She otherwise felt well. She did not seek immediate medical attention due to lack of insurance. She finally got her insurance approved and saw her primary care physician recently. She was referred for mammogram which showed a 2.5 cm irregular mass in the upper inner left breast. She underwent left breast mass and axillary node biopsy on 09/29/2014 and both biopsy showed invasive ductal carcinoma, ER negative, PR negative, HER-2 negative.  She otherwise feels well, no pain or ther complains. She has good appetite and her weight is stable.  CURRENT THERAPY: Neoadjuvant chemotherapy with ddAC every 2 weeks x4, followed by weekly Taxol 12, started on 10/24/2058, Taxol started on 12/26/14  INTERIM HISTORY; Ashlee Hill returns for follow-up and 8th dose of Taxol. She is tolerating weekly Taxol well overall. She has nasal congestion and small bleeding when she blows her nose. Her mouth is dry, but no significant ulcers or mouth pain. She is eating and drinking well. She has mild to moderate fatigue, no fever or chills, no neuropathy or other new complaints.  MEDICAL HISTORY:  Past Medical History  Diagnosis Date  . Breast cancer of upper-inner quadrant of left female breast 10/01/2014  . Hypertension   . Family history of breast cancer   . Wears glasses  driving  . Diabetes mellitus without complication 0/26/37    chemo caused diabetes per pt     SURGICAL HISTORY: Past Surgical History  Procedure Laterality Date  . Tonsillectomy    . Wisdom tooth extraction    . Portacath placement Right 10/21/2014    Procedure: INSERTION PORT-A-CATH;  Surgeon:  Erroll Luna, MD;  Location: [facility];  Service: General;  Laterality: Right;     GYN HISTORY  Menarchal: 11 LMP: 09/27/2014 Contraceptive: no HRT:  G0P0    SOCIAL HISTORY: History   Social History  . Marital Status: Single    Spouse Name: N/A  . Number of Children: N/A  . Years of Education: N/A   Occupational History  . Not on file.   Social History Main Topics  . Smoking status: Never Smoker   . Smokeless tobacco: Never Used  . Alcohol Use: 0.6 oz/week    1 Glasses of wine per week     Comment: socail drinker  . Drug Use: No  . Sexual Activity: Not on file   Other Topics Concern  . Not on file   Social History Narrative    FAMILY HISTORY: Family History  Problem Relation Age of Onset  . Prostate cancer Father 57    currently 42  . Breast cancer Paternal Aunt 61    deceased 71  . Lung cancer Maternal Grandfather 22    smoker; deceased  . Thyroid cancer Cousin 51    pat first cousin; currently 37     Father had prostate cancer at age of 63 Paternal aunt had breast caner in her 53's Paternal cousin had thyroid cancer at age of before 60 Maternal grandfather had lung cancer    ALLERGIES:  is allergic to tegaderm ag mesh.  MEDICATIONS:  Current Outpatient Prescriptions  Medication Sig Dispense Refill  . ALPRAZolam (XANAX) 1 MG tablet Take 1-2 mg by mouth 2 (two) times daily. Take 1 tablet (1 mg) in the morning & Take 2 tablets (2 mg) in the evening.    . Alum & Mag Hydroxide-Simeth (MAGIC MOUTHWASH W/LIDOCAINE) SOLN Take 5 mLs by mouth 4 (four) times daily as needed for mouth pain. 120 mL 1  . aspirin 81 MG tablet Take 81 mg by mouth daily.    Marland Kitchen atorvastatin (LIPITOR) 10 MG tablet Take 10 mg by mouth daily.    . B-D UF III MINI PEN NEEDLES 31G X 5 MM MISC   0  . bisacodyl (DULCOLAX) 5 MG EC tablet Take 5 mg by mouth daily as needed for moderate constipation.    . calcium carbonate (OS-CAL) 600 MG TABS tablet Take 600 mg by mouth daily  with breakfast.    . dexamethasone (DECADRON) 4 MG tablet 1/2 tablet (2 mg) Q AM for nausea PRN. 10 tablet 0  . gabapentin (NEURONTIN) 600 MG tablet Take 600 mg by mouth at bedtime. As needed for sleep or anxiety    . hydrochlorothiazide (HYDRODIURIL) 25 MG tablet Take 25 mg by mouth daily.     . insulin aspart (NOVOLOG) 100 UNIT/ML injection Inject into the skin 3 (three) times daily before meals. Just started-sliding scale per Dr Delfina Redwood    . LANTUS SOLOSTAR 100 UNIT/ML Solostar Pen Inject 30 Units into the skin at bedtime.   0  . LORazepam (ATIVAN) 1 MG tablet Take 1 tablet (1 mg total) by mouth at bedtime as needed for sleep. 30 tablet 0  . menthol-cetylpyridinium (CEPACOL) 3 MG lozenge Take 1 lozenge  by mouth every 2 (two) hours as needed for sore throat (sore throat).    . metoprolol (LOPRESSOR) 100 MG tablet Take 100 mg by mouth daily.    . ondansetron (ZOFRAN) 8 MG tablet Take 1 tablet (8 mg total) by mouth every 8 (eight) hours as needed for nausea. Start on the third day after chemotherapy. 30 tablet 1  . ONE TOUCH ULTRA TEST test strip TEST BLOOD SUGAR AS DIRECTED THREE TIMES A DAY  0  . ONETOUCH DELICA LANCETS 39J MISC use to STICK FINGER three times a day as directed  0  . oxyCODONE-acetaminophen (ROXICET) 5-325 MG per tablet Take 1-2 tablets by mouth every 4 (four) hours as needed. 30 tablet 0  . PRESCRIPTION MEDICATION Chemo [facility]    . prochlorperazine (COMPAZINE) 10 MG tablet Take 1 tablet (10 mg total) by mouth every 6 (six) hours as needed for nausea or vomiting. 30 tablet 1  . venlafaxine XR (EFFEXOR-XR) 150 MG 24 hr capsule Take 150 mg by mouth daily with breakfast.    . zolpidem (AMBIEN) 10 MG tablet Take 10 mg by mouth at bedtime as needed for sleep.     No current facility-administered medications for this visit.    REVIEW OF SYSTEMS:   Constitutional: Denies fevers, chills or abnormal night sweats Eyes: Denies blurriness of vision, double vision or watery eyes Ears,  nose, mouth, throat, and face: resolving mucositis and sore throat Respiratory: Denies cough, dyspnea or wheezes Cardiovascular: Denies palpitation, chest discomfort or lower extremity swelling Gastrointestinal:  Denies nausea, heartburn or change in bowel habits Skin: Denies abnormal skin rashes Lymphatics: Denies new lymphadenopathy or easy bruising Neurological:Denies numbness, tingling or new weaknesses Behavioral/Psych: Mood is stable, no new changes  All other systems were reviewed with the patient and are negative.  PHYSICAL EXAMINATION: ECOG PERFORMANCE STATUS: 1  Filed Vitals:   02/13/15 0925  BP: 136/82  Pulse: 98  Temp: 98.4 F (36.9 C)  Resp: 20   Filed Weights   02/13/15 0925  Weight: 228 lb 6.4 oz (103.602 kg)    GENERAL:alert, no distress and comfortable SKIN: skin color, texture, turgor are normal, no rashes or significant lesions. Port site looks clean, (+) skin peeling on the bottom of left feet EYES: normal, conjunctiva are pink and non-injected, sclera clear OROPHARYNX:no exudate, no erythema and lips, buccal mucosa, and tongue normal  NECK: supple, thyroid normal size, non-tender, without nodularity LYMPH:  no palpable lymphadenopathy in the cervical, axillary or inguinal LUNGS: clear to auscultation and percussion with normal breathing effort HEART: regular rate & rhythm and no murmurs and no lower extremity edema ABDOMEN:abdomen soft, non-tender and normal bowel sounds Musculoskeletal:no cyanosis of digits and no clubbing  PSYCH: alert & oriented x 3 with fluent speech NEURO: no focal motor/sensory deficits Breasts: Breast inspection showed them to be symmetrical with no nipple discharge. Palpation of the left breasts showed a tiny residual small mass in the upper mid quadrant (much smaller than before), no palpable axillary lymph node. Exam of the right breast and axillary was normal.    LABORATORY DATA:  I have reviewed the data as listed CBC Latest  Ref Rng 02/13/2015 02/06/2015 01/30/2015  WBC 3.9 - 10.3 10e3/uL 3.3(L) 2.9(L) 4.3  Hemoglobin 11.6 - 15.9 g/dL 9.0(L) 8.6(L) 10.1(L)  Hematocrit 34.8 - 46.6 % 26.1(L) 25.5(L) 29.1(L)  Platelets 145 - 400 10e3/uL 211 190 223     Recent Labs  12/04/14 1134 12/04/14 1820 12/05/14 0520 12/06/14 0530  01/30/15 0857 02/06/15  9675 02/13/15 0857  NA 129*  --  138 138  < > 137 139 136  K 3.6  --  3.3* 4.1  < > 4.2 4.0 4.2  CL 91*  --  103 106  --   --   --   --   CO2 25  --  27 24  < > 19* 24 25  GLUCOSE 422*  --  227* 204*  < > 374* 174* 166*  BUN 10  --  7 <5*  < > 11.7 19.3 13.6  CREATININE 1.27*  --  1.03 0.79  < > 0.8 1.0 1.0  CALCIUM 8.8  --  8.1* 7.7*  < > 8.9 9.4 9.1  GFRNONAA 48*  --  61* >90  --   --   --   --   GFRAA 55*  --  71* >90  --   --   --   --   PROT 7.5  --  5.9*  --   < > 7.2 6.4 6.8  ALBUMIN 4.5  --  3.3*  --   < > 3.8 3.7 3.8  AST 36  --  28  --   < > 53* 31 37*  ALT 28  --  22  --   < > 48 41 39  ALKPHOS 92  --  62  --   < > 115 102 121  BILITOT 1.3* 0.9 0.8  --   < > 1.00 0.80 0.71  BILIDIR  --  0.2  --   --   --   --   --   --   IBILI  --  0.7  --   --   --   --   --   --   < > = values in this interval not displayed.  PATHOLOGY REPORT 09/26/2014 Diagnosis 1. Breast, left, needle core biopsy, mass, 11 o'clock - INVASIVE MAMMARY CARCINOMA, SEE COMMENT. - MAMMARY CARCINOMA IN SITU. 2. Lymph node, needle/core biopsy, left axillary - ONE LYMPH NODE, POSITIVE FOR METASTATIC MAMMARY CARCINOMA (1/1). Microscopic Comment 1. Although grade of tumor is best assessed at resection, with these biopsies, both the in situ and invasive carcinoma are grade II. The invasive carcinoma demonstrates strong diffuse E-cadherin expression; supporting a ductal phenotype. 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  Estrogen Receptor: 0%,  NEGATIVE Progesterone Receptor: 0%, NEGATIVE Proliferation Marker Ki67: 93% 1. A sample was sent to [facility] for HER-2 testing by FISH. The results are as follows: Negative.  RADIOGRAPHIC STUDIES: I have personally reviewed the radiological images as listed and agreed with the findings in the report.  Mr Breast Bilateral W Wo Contrast 10/08/2014    IMPRESSION: 1. Biopsy proven malignancy in the upper inner left breast with adjacent/contiguous areas of nodularity measures up to 3.5 cm, with multiple morphologically abnormal axillary lymph nodes compatible with known axillary metastases.  2. No MRI evidence of malignancy in the right breast.  RECOMMENDATION: Treatment plan for known left breast malignancy.  BI-RADS CATEGORY  6: Known biopsy-proven malignancy.   Electronically Signed   By: Everlean Alstrom M.D.   On: 10/08/2014 10:29   US Breast Ltd Uni Left Inc Axilla 09/26/2014    IMPRESSION: 2.5 cm irregular mass in the upper inner left breast with adjacent 1.1 cm satellite mass and enlarged left axillary lymph nodes, highly suspicious for left breast malignancy and left  axillary lymph node metastases. Tissue sampling is recommended.  No mammographic evidence of right breast malignancy.    CT chest, abdomen and pelvis with contrast 10/17/2014 IMPRESSION: 1. Left breast lesion is identified compatible with the clinical history of breast cancer. 2. Enlarged left axillary lymph nodes suspicious for metastatic adenopathy. 3. No specific features identified to suggest distant metastatic disease. 4. Hepatic steatosis.  Bone scan 10/17/2014 IMPRESSION: 1. The uptake pattern within the skeleton is not highly suspicious for malignancy. However, subtle increased uptake in the midshaft of the right humerus and the lower lumbar spine posteriorly merits further evaluation with plain films. 2. The uptake over the lower extremities is consistent with degenerative change.  Lumbar spine X-ray  10/20/14 IMPRESSION: No lytic or sclerotic osseous lesion.  Probable degenerative facet osteoarthritic change at L4-L5 and L5-S1 which may correspond to the area of abnormal uptake at recent bone scan.  RIGHT HUMERUS X-RAY 10/20/2014 IMPRESSION: No abnormality seen to correspond to abnormal uptake seen on bone scan   ASSESSMENT & PLAN:  54 year old pre-menopausal female with past medical history of hypertension, presented with a palpable left breast mass.  1. Left breast invasive ductal carcinoma, cT2N1M0, stage IIB, triple negative  -I discussed her all imaging finding and biopsy results extensively with patient. Her ultrasound and MRI breast reviewed multiple left axillary lymph nodes, at least N1 disease, possible N2. I discussed with her and she has locally advanced disease, and triple-negative breast cancer tends to be more aggressive with early metastasis and cancer recurrence after surgery. -a staging CT showed no evidence of distant metastasis, the subtle increased uptake in the right humerus and lower lumbar spine on the bone scan was further evaluated by x-ray, which were negative. - I recommend neoadjuvant chemotherapy given her very high risk of cancer recurrence after surgery alone. -She has no history of cardiac disease, normal EF on echo,  I would recommend dose dense Adriamycin and Cytoxan followed by paclitaxel.  -Given her young age and triple negative disease, positive family history, she was referred to see genetic counselor for genetic testing, her genetic testing was negative.  -Lab reviewed, adequate for treatment, we will continue weekly paclitaxel today. -Repeat breast MRI after she completes neoadjuvant chemotherapy -I discussed the clinical trial [facility] [id number], which minimize patient to assess her lymph node dissection versus radiation after neoadjuvant chemotherapy. She is interested. I'll refer her to our clinical trial team.  2. Genetics  -Her genetic  testing for inheritable breast cancer was negative  3.Anxiety and coping -she will take her xanax needed -I encouraged her to think positive -She has good support from her parents and friend.   4. Nausea, vomiting, anorexia -secondary to chemotherapy, better with taxol now  -She knows to take Compazine and Zofran as needed -I encouraged her to eat small female but for more frequent, and take nutrition supplement  5. Newly diagnosed type 2 diabetes -Continue Lantus and insulin sliding scale, follow-up with her primary care physician. -She is on low-dose dexamethasone 5 mg before Taxol, steroids induced hyperglycemia discussed. -Due to her hyperglycemia (secondary to incidental dexamethasone last night), I'll give her 500 mL normal saline and extra dose insulin at infusion today.   6. Cytopenia secondary to chemo -She received a blood transfusion after the third cycle AC and 5th cycle Taxol  -no lab evidence of hemolysis, or nutritional anemia also.  Plan -Continue weekly Taxol, week 8 today. -She will be seen by nurse practitioner in 2 weeks, I'll see her back  with her last cycle Taxol. -Clinical trial [facility] [phone] screening   All questions were answered. The patient knows to call the clinic with any problems, questions or concerns. I spent 20 minutes counseling the patient face to face. The total time spent in the appointment was 25 minutes and more than 50% was on counseling.     Truitt Merle, MD 02/13/2015 9:51 AM

## 2015-02-13 NOTE — Progress Notes (Signed)
Pt declined AVS.

## 2015-02-13 NOTE — Patient Instructions (Addendum)
[facility] Discharge Instructions for Patients Receiving Chemotherapy  Today you received the following chemotherapy agents:  Taxol  To help prevent nausea and vomiting after your treatment, we encourage you to take your nausea medication as ordered per MD.   If you develop nausea and vomiting that is not controlled by your nausea medication, call the clinic.   BELOW ARE SYMPTOMS THAT SHOULD BE REPORTED IMMEDIATELY:  *FEVER GREATER THAN 100.5 F  *CHILLS WITH OR WITHOUT FEVER  NAUSEA AND VOMITING THAT IS NOT CONTROLLED WITH YOUR NAUSEA MEDICATION  *UNUSUAL SHORTNESS OF BREATH  *UNUSUAL BRUISING OR BLEEDING  TENDERNESS IN MOUTH AND THROAT WITH OR WITHOUT PRESENCE OF ULCERS  *URINARY PROBLEMS  *BOWEL PROBLEMS  UNUSUAL RASH Items with * indicate a potential emergency and should be followed up as soon as possible.  Feel free to call the clinic you have any questions or concerns. The clinic phone number is [phone].  Please show the CHEMO ALERT CARD at check-in to the Emergency Department and triage nurse.   

## 2015-02-19 ENCOUNTER — Other Ambulatory Visit: Payer: Self-pay | Admitting: *Deleted

## 2015-02-20 ENCOUNTER — Ambulatory Visit (HOSPITAL_COMMUNITY)
Admission: RE | Admit: 2015-02-20 | Discharge: 2015-02-20 | Disposition: A | Discharge status: Home / Self Care | Payer: 59 | Source: Ambulatory Visit | Attending: Hematology | Admitting: Hematology

## 2015-02-20 ENCOUNTER — Other Ambulatory Visit (HOSPITAL_BASED_OUTPATIENT_CLINIC_OR_DEPARTMENT_OTHER): Payer: 59

## 2015-02-20 ENCOUNTER — Ambulatory Visit (HOSPITAL_BASED_OUTPATIENT_CLINIC_OR_DEPARTMENT_OTHER): Payer: 59

## 2015-02-20 ENCOUNTER — Ambulatory Visit (HOSPITAL_BASED_OUTPATIENT_CLINIC_OR_DEPARTMENT_OTHER): Payer: 59 | Admitting: Physician Assistant

## 2015-02-20 ENCOUNTER — Encounter: Payer: Self-pay | Admitting: Physician Assistant

## 2015-02-20 ENCOUNTER — Telehealth: Payer: Self-pay | Admitting: Hematology

## 2015-02-20 ENCOUNTER — Encounter: Payer: Self-pay | Admitting: General Practice

## 2015-02-20 VITALS — BP 162/93 | HR 120 | Temp 98.7°F | Resp 20 | Ht 65.0 in | Wt 231.8 lb

## 2015-02-20 DIAGNOSIS — Z5111 Encounter for antineoplastic chemotherapy: Secondary | ICD-10-CM

## 2015-02-20 DIAGNOSIS — C50212 Malignant neoplasm of upper-inner quadrant of left female breast: Secondary | ICD-10-CM | POA: Diagnosis not present

## 2015-02-20 DIAGNOSIS — R739 Hyperglycemia, unspecified: Secondary | ICD-10-CM

## 2015-02-20 DIAGNOSIS — D649 Anemia, unspecified: Secondary | ICD-10-CM

## 2015-02-20 LAB — CBC WITH DIFFERENTIAL/PLATELET
BASO%: 0.8 % (ref 0.0–2.0)
BASOS ABS: 0 10*3/uL (ref 0.0–0.1)
EOS%: 1.9 % (ref 0.0–7.0)
Eosinophils Absolute: 0.1 10*3/uL (ref 0.0–0.5)
HCT: 26 % — ABNORMAL LOW (ref 34.8–46.6)
HEMOGLOBIN: 8.9 g/dL — AB (ref 11.6–15.9)
LYMPH%: 25.5 % (ref 14.0–49.7)
MCH: 36 pg — ABNORMAL HIGH (ref 25.1–34.0)
MCHC: 34.2 g/dL (ref 31.5–36.0)
MCV: 105.3 fL — ABNORMAL HIGH (ref 79.5–101.0)
MONO#: 0.3 10*3/uL (ref 0.1–0.9)
MONO%: 7.4 % (ref 0.0–14.0)
NEUT#: 2.4 10*3/uL (ref 1.5–6.5)
NEUT%: 64.4 % (ref 38.4–76.8)
Platelets: 191 10*3/uL (ref 145–400)
RBC: 2.47 10*6/uL — ABNORMAL LOW (ref 3.70–5.45)
RDW: 23.6 % — ABNORMAL HIGH (ref 11.2–14.5)
WBC: 3.8 10*3/uL — ABNORMAL LOW (ref 3.9–10.3)
lymph#: 1 10*3/uL (ref 0.9–3.3)

## 2015-02-20 LAB — COMPREHENSIVE METABOLIC PANEL (CC13)
ALK PHOS: 101 U/L (ref 40–150)
ALT: 33 U/L (ref 0–55)
ANION GAP: 12 meq/L — AB (ref 3–11)
AST: 29 U/L (ref 5–34)
Albumin: 3.7 g/dL (ref 3.5–5.0)
BILIRUBIN TOTAL: 0.86 mg/dL (ref 0.20–1.20)
BUN: 8.4 mg/dL (ref 7.0–26.0)
CO2: 23 meq/L (ref 22–29)
Calcium: 9.1 mg/dL (ref 8.4–10.4)
Chloride: 104 mEq/L (ref 98–109)
Creatinine: 0.9 mg/dL (ref 0.6–1.1)
EGFR: 75 mL/min/{1.73_m2} — ABNORMAL LOW (ref >90)
Glucose: 188 mg/dl — ABNORMAL HIGH (ref 70–140)
Potassium: 4.1 mEq/L (ref 3.5–5.1)
Sodium: 139 mEq/L (ref 136–145)
Total Protein: 6.6 g/dL (ref 6.4–8.3)

## 2015-02-20 MED ORDER — SODIUM CHLORIDE 0.9 % IV SOLN
Freq: Once | INTRAVENOUS | Status: AC
  Administered 2015-02-20: 10:00:00 via INTRAVENOUS
  Filled 2015-02-20: qty 4

## 2015-02-20 MED ORDER — SODIUM CHLORIDE 0.9 % IV SOLN
Freq: Once | INTRAVENOUS | Status: AC
  Administered 2015-02-20: 10:00:00 via INTRAVENOUS

## 2015-02-20 MED ORDER — SODIUM CHLORIDE 0.9 % IV SOLN
80.0000 mg/m2 | Freq: Once | INTRAVENOUS | Status: AC
  Administered 2015-02-20: 174 mg via INTRAVENOUS
  Filled 2015-02-20: qty 29

## 2015-02-20 MED ORDER — DIPHENHYDRAMINE HCL 50 MG/ML IJ SOLN
INTRAMUSCULAR | Status: AC
  Filled 2015-02-20: qty 1

## 2015-02-20 MED ORDER — SODIUM CHLORIDE 0.9 % IJ SOLN
10.0000 mL | INTRAMUSCULAR | Status: DC | PRN
  Administered 2015-02-20: 10 mL
  Filled 2015-02-20: qty 10

## 2015-02-20 MED ORDER — FAMOTIDINE IN NACL 20-0.9 MG/50ML-% IV SOLN
20.0000 mg | Freq: Once | INTRAVENOUS | Status: AC
  Administered 2015-02-20: 20 mg via INTRAVENOUS

## 2015-02-20 MED ORDER — FAMOTIDINE IN NACL 20-0.9 MG/50ML-% IV SOLN
INTRAVENOUS | Status: AC
  Filled 2015-02-20: qty 50

## 2015-02-20 MED ORDER — HEPARIN SOD (PORK) LOCK FLUSH 100 UNIT/ML IV SOLN
500.0000 [IU] | Freq: Once | INTRAVENOUS | Status: AC | PRN
  Administered 2015-02-20: 500 [IU]
  Filled 2015-02-20: qty 5

## 2015-02-20 MED ORDER — DIPHENHYDRAMINE HCL 50 MG/ML IJ SOLN
50.0000 mg | Freq: Once | INTRAMUSCULAR | Status: AC
  Administered 2015-02-20: 50 mg via INTRAVENOUS

## 2015-02-20 NOTE — Progress Notes (Addendum)
1 week [facility]  Telephone:([phone] Fax:([phone]) [place] Note   Patient Care Team: Seward Carol, MD as PCP - General (Internal Medicine) Erroll Luna, MD as Consulting Physician (General Surgery) Truitt Merle, MD as Consulting Physician (Hematology) Thea Silversmith, MD as Consulting Physician (Radiation Oncology) Rockwell Germany, RN as Registered Nurse Mauro Kaufmann, RN as Registered Nurse 02/20/2015  CHIEF COMPLAINTS  Follow up breast cancer   Oncology History   Breast cancer of upper-inner quadrant of left female breast   Staging form: Breast, AJCC 7th Edition     Clinical stage from 10/08/2014: Stage IIB (T2, N1, M0) - Unsigned     Pathologic: Stage IIB (T2, N1, cM0) - Unsigned       Breast cancer of upper-inner quadrant of left female breast   09/26/2014 Breast US 2.5 cm irregular mass in the upper inner left breast with adjacent 1.1 cm satellite mass and enlarged left axillary lymph nodes, highly suspicious for left breast malignancy and left axillary lymph node metastases. Tissue sampling is recommended.    09/26/2014 Mammogram Spot compression views of the left breast and routine views of both breasts demonstrate a 2 x 2.5 cm irregular mass in the upper inner left breast.    09/29/2014 Initial Biopsy 1. Breast, left, needle core biopsy, mass, 11 o'clock - INVASIVE MAMMARY CARCINOMA, SEE COMMENT. - MAMMARY CARCINOMA IN SITU. 2. Lymph node, needle/core biopsy, left axillary - ONE LYMPH NODE, POSITIVE FOR METASTATIC MAMMARY CARCINOMA (1/1).   09/29/2014 Receptors her2 Estrogen Receptor: 0%, NEGATIVE Progesterone Receptor: 0%, NEGATIVE, HER2 negative,  Proliferation Marker Ki67: 93%   10/01/2014 Initial Diagnosis Breast cancer of upper-inner quadrant of left female breast   10/17/2014 Imaging CT and bone scan negative for distant metastasis    10/24/2014 -  Adjuvant Chemotherapy ddACx4 then weekly Taxol X12   11/14/2014 Miscellaneous Genetic testing  BreastNext @ [facility] was negative except 2 variants with unknown significance: ATM p.[id number] and RAD51C p.I52L     HISTORY OF PRESENTING ILLNESS:  Ashlee Hill 54 y.o. female presents to our multidisciplinary breast clinic today to discuss the management of her newly diagnosed breast cancer  She noticed a left breast lump in August 2015, no tenderness, skin or nipple change. She otherwise felt well. She did not seek immediate medical attention due to lack of insurance. She finally got her insurance approved and saw her primary care physician recently. She was referred for mammogram which showed a 2.5 cm irregular mass in the upper inner left breast. She underwent left breast mass and axillary node biopsy on 09/29/2014 and both biopsy showed invasive ductal carcinoma, ER negative, PR negative, HER-2 negative.  She otherwise feels well, no pain or ther complains. She has good appetite and her weight is stable.  CURRENT THERAPY: Neoadjuvant chemotherapy with ddAC every 2 weeks x4, followed by weekly Taxol 12, started on 10/24/2058, Taxol started on 12/26/14  INTERIM HISTORY; Ashlee Hill returns for follow-up and 9th dose of Taxol. She is tolerating weekly Taxol well overall. She has nasal congestion and small bleeding when she blows her nose. She saw her primary care provider who felt her symptoms were caused by mucositis from her chemotherapy and having lost her nasal hair. She has some constipation but this is well managed with Ducolax. She has mild to moderate fatigue, no fever or chills, no neuropathy or other new complaints.She is wondering about the timeline for the next steps in her treatment - the MRI, surgery and  radiation therapy. She is wanting to return to work.  MEDICAL HISTORY:  Past Medical History  Diagnosis Date  . Breast cancer of upper-inner quadrant of left female breast 10/01/2014  . Hypertension   . Family history of breast cancer   . Wears glasses     driving  . Diabetes  mellitus without complication 4/43/15    chemo caused diabetes per pt     SURGICAL HISTORY: Past Surgical History  Procedure Laterality Date  . Tonsillectomy    . Wisdom tooth extraction    . Portacath placement Right 10/21/2014    Procedure: INSERTION PORT-A-CATH;  Surgeon: Erroll Luna, MD;  Location: [facility];  Service: General;  Laterality: Right;     GYN HISTORY  Menarchal: 11 LMP: 09/27/2014 Contraceptive: no HRT:  G0P0    SOCIAL HISTORY: History   Social History  . Marital Status: Single    Spouse Name: N/A  . Number of Children: N/A  . Years of Education: N/A   Occupational History  . Not on file.   Social History Main Topics  . Smoking status: Never Smoker   . Smokeless tobacco: Never Used  . Alcohol Use: 0.6 oz/week    1 Glasses of wine per week     Comment: socail drinker  . Drug Use: No  . Sexual Activity: Not on file   Other Topics Concern  . Not on file   Social History Narrative    FAMILY HISTORY: Family History  Problem Relation Age of Onset  . Prostate cancer Father 55    currently 65  . Breast cancer Paternal Aunt 63    deceased 66  . Lung cancer Maternal Grandfather 3    smoker; deceased  . Thyroid cancer Cousin 70    pat first cousin; currently 4     Father had prostate cancer at age of 17 Paternal aunt had breast caner in her 70's Paternal cousin had thyroid cancer at age of before 50 Maternal grandfather had lung cancer    ALLERGIES:  is allergic to tegaderm ag mesh.  MEDICATIONS:  Current Outpatient Prescriptions  Medication Sig Dispense Refill  . ALPRAZolam (XANAX) 1 MG tablet Take 1-2 mg by mouth 2 (two) times daily. Take 1 tablet (1 mg) in the morning & Take 2 tablets (2 mg) in the evening.    . Alum & Mag Hydroxide-Simeth (MAGIC MOUTHWASH W/LIDOCAINE) SOLN Take 5 mLs by mouth 4 (four) times daily as needed for mouth pain. 120 mL 1  . aspirin 81 MG tablet Take 81 mg by mouth daily.    Marland Kitchen atorvastatin  (LIPITOR) 10 MG tablet Take 10 mg by mouth daily.    . B-D UF III MINI PEN NEEDLES 31G X 5 MM MISC   0  . bisacodyl (DULCOLAX) 5 MG EC tablet Take 5 mg by mouth daily as needed for moderate constipation.    . calcium carbonate (OS-CAL) 600 MG TABS tablet Take 600 mg by mouth daily with breakfast.    . dexamethasone (DECADRON) 4 MG tablet 1/2 tablet (2 mg) Q AM for nausea PRN. 10 tablet 0  . gabapentin (NEURONTIN) 600 MG tablet Take 600 mg by mouth at bedtime. As needed for sleep or anxiety    . hydrochlorothiazide (HYDRODIURIL) 25 MG tablet Take 25 mg by mouth daily.     . insulin aspart (NOVOLOG) 100 UNIT/ML injection Inject into the skin 3 (three) times daily before meals. Just started-sliding scale per Dr Delfina Redwood    .  LANTUS SOLOSTAR 100 UNIT/ML Solostar Pen Inject 30 Units into the skin at bedtime.   0  . LORazepam (ATIVAN) 1 MG tablet Take 1 tablet (1 mg total) by mouth at bedtime as needed for sleep. 30 tablet 0  . menthol-cetylpyridinium (CEPACOL) 3 MG lozenge Take 1 lozenge by mouth every 2 (two) hours as needed for sore throat (sore throat).    . metoprolol (LOPRESSOR) 100 MG tablet Take 100 mg by mouth daily.    . ondansetron (ZOFRAN) 8 MG tablet Take 1 tablet (8 mg total) by mouth every 8 (eight) hours as needed for nausea. Start on the third day after chemotherapy. 30 tablet 1  . ONE TOUCH ULTRA TEST test strip TEST BLOOD SUGAR AS DIRECTED THREE TIMES A DAY  0  . ONETOUCH DELICA LANCETS 49Z MISC use to STICK FINGER three times a day as directed  0  . oxyCODONE-acetaminophen (ROXICET) 5-325 MG per tablet Take 1-2 tablets by mouth every 4 (four) hours as needed. 30 tablet 0  . PRESCRIPTION MEDICATION Chemo [facility]    . prochlorperazine (COMPAZINE) 10 MG tablet Take 1 tablet (10 mg total) by mouth every 6 (six) hours as needed for nausea or vomiting. 30 tablet 1  . venlafaxine XR (EFFEXOR-XR) 150 MG 24 hr capsule Take 150 mg by mouth daily with breakfast.    . zolpidem (AMBIEN) 10 MG tablet  Take 10 mg by mouth at bedtime as needed for sleep.     No current facility-administered medications for this visit.    REVIEW OF SYSTEMS:   Constitutional: Denies fevers, chills or abnormal night sweats Eyes: Denies blurriness of vision, double vision or watery eyes Ears, nose, mouth, throat, and face: resolving mucositis and sore throat Respiratory: Denies cough, dyspnea or wheezes Cardiovascular: Denies palpitation, chest discomfort or lower extremity swelling Gastrointestinal:  Denies nausea, heartburn or change in bowel habits Skin: Denies abnormal skin rashes Lymphatics: Denies new lymphadenopathy or easy bruising Neurological:Denies numbness, tingling or new weaknesses Behavioral/Psych: Mood is stable, no new changes  All other systems were reviewed with the patient and are negative.  PHYSICAL EXAMINATION: ECOG PERFORMANCE STATUS: 1  Filed Vitals:   02/20/15 0854  BP: 162/93  Pulse: 120  Temp: 98.7 F (37.1 C)  Resp: 20   Filed Weights   02/20/15 0854  Weight: 231 lb 12.8 oz (105.144 kg)    GENERAL:alert, no distress and comfortable SKIN: skin color, texture, turgor are normal, no rashes or significant lesions. Port site looks clean, (+) skin peeling on the bottom of left feet EYES: normal, conjunctiva are pink and non-injected, sclera clear OROPHARYNX:no exudate, no erythema and lips, buccal mucosa, and tongue normal  NECK: supple, thyroid normal size, non-tender, without nodularity LYMPH:  no palpable lymphadenopathy in the cervical, axillary or inguinal LUNGS: clear to auscultation and percussion with normal breathing effort HEART: regular rate & rhythm and no murmurs and no lower extremity edema ABDOMEN:abdomen soft, non-tender and normal bowel sounds Musculoskeletal:no cyanosis of digits and no clubbing  PSYCH: alert & oriented x 3 with fluent speech NEURO: no focal motor/sensory deficits Breasts: Exam deferred   LABORATORY DATA:  I have reviewed the data  as listed CBC Latest Ref Rng 02/20/2015 02/13/2015 02/06/2015  WBC 3.9 - 10.3 10e3/uL 3.8(L) 3.3(L) 2.9(L)  Hemoglobin 11.6 - 15.9 g/dL 8.9(L) 9.0(L) 8.6(L)  Hematocrit 34.8 - 46.6 % 26.0(L) 26.1(L) 25.5(L)  Platelets 145 - 400 10e3/uL 191 211 190     Recent Labs  12/04/14 1134 12/04/14 1820 12/05/14  3382 12/06/14 0530  02/06/15 0859 02/13/15 0857 02/20/15 0832  NA 129*  --  138 138  < > 139 136 139  K 3.6  --  3.3* 4.1  < > 4.0 4.2 4.1  CL 91*  --  103 106  --   --   --   --   CO2 25  --  27 24  < > _0 GLUCOSE 422*  --  227* 204*  < > 174* 166* 188*  BUN 10  --  7 <5*  < > 19.3 13.6 8.4  CREATININE 1.27*  --  1.03 0.79  < > 1.0 1.0 0.9  CALCIUM 8.8  --  8.1* 7.7*  < > 9.4 9.1 9.1  GFRNONAA 48*  --  61* >90  --   --   --   --   GFRAA 55*  --  71* >90  --   --   --   --   PROT 7.5  --  5.9*  --   < > 6.4 6.8 6.6  ALBUMIN 4.5  --  3.3*  --   < > 3.7 3.8 3.7  AST 36  --  28  --   < > 31 37* 29  ALT 28  --  22  --   < > 41 39 33  ALKPHOS 92  --  62  --   < > 102 121 101  BILITOT 1.3* 0.9 0.8  --   < > 0.80 0.71 0.86  BILIDIR  --  0.2  --   --   --   --   --   --   IBILI  --  0.7  --   --   --   --   --   --   < > = values in this interval not displayed.  PATHOLOGY REPORT 09/26/2014 Diagnosis 1. Breast, left, needle core biopsy, mass, 11 o'clock - INVASIVE MAMMARY CARCINOMA, SEE COMMENT. - MAMMARY CARCINOMA IN SITU. 2. Lymph node, needle/core biopsy, left axillary - ONE LYMPH NODE, POSITIVE FOR METASTATIC MAMMARY CARCINOMA (1/1). Microscopic Comment 1. Although grade of tumor is best assessed at resection, with these biopsies, both the in situ and invasive carcinoma are grade II. The invasive carcinoma demonstrates strong diffuse E-cadherin expression; supporting a ductal phenotype. 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  Estrogen  Receptor: 0%, NEGATIVE Progesterone Receptor: 0%, NEGATIVE Proliferation Marker Ki67: 93% 1. A sample was sent to [facility] for HER-2 testing by FISH. The results are as follows: Negative.  RADIOGRAPHIC STUDIES: I have personally reviewed the radiological images as listed and agreed with the findings in the report.  Mr Breast Bilateral W Wo Contrast 10/08/2014    IMPRESSION: 1. Biopsy proven malignancy in the upper inner left breast with adjacent/contiguous areas of nodularity measures up to 3.5 cm, with multiple morphologically abnormal axillary lymph nodes compatible with known axillary metastases.  2. No MRI evidence of malignancy in the right breast.  RECOMMENDATION: Treatment plan for known left breast malignancy.  BI-RADS CATEGORY  6: Known biopsy-proven malignancy.   Electronically Signed   By: Everlean Alstrom M.D.   On: 10/08/2014 10:29   US Breast Ltd Uni Left Inc Axilla 09/26/2014    IMPRESSION: 2.5 cm irregular mass in the upper inner left breast with adjacent 1.1 cm satellite mass and enlarged left axillary lymph nodes, highly  suspicious for left breast malignancy and left axillary lymph node metastases. Tissue sampling is recommended.  No mammographic evidence of right breast malignancy.    CT chest, abdomen and pelvis with contrast 10/17/2014 IMPRESSION: 1. Left breast lesion is identified compatible with the clinical history of breast cancer. 2. Enlarged left axillary lymph nodes suspicious for metastatic adenopathy. 3. No specific features identified to suggest distant metastatic disease. 4. Hepatic steatosis.  Bone scan 10/17/2014 IMPRESSION: 1. The uptake pattern within the skeleton is not highly suspicious for malignancy. However, subtle increased uptake in the midshaft of the right humerus and the lower lumbar spine posteriorly merits further evaluation with plain films. 2. The uptake over the lower extremities is consistent with degenerative change.  Lumbar  spine X-ray 10/20/14 IMPRESSION: No lytic or sclerotic osseous lesion.  Probable degenerative facet osteoarthritic change at L4-L5 and L5-S1 which may correspond to the area of abnormal uptake at recent bone scan.  RIGHT HUMERUS X-RAY 10/20/2014 IMPRESSION: No abnormality seen to correspond to abnormal uptake seen on bone scan   ASSESSMENT & PLAN:  54 year old pre-menopausal female with past medical history of hypertension, presented with a palpable left breast mass.  1. Left breast invasive ductal carcinoma, cT2N1M0, stage IIB, triple negative  -Dr. Burr Medico discussed her all imaging finding and biopsy results extensively with patient. Her ultrasound and MRI breast reviewed multiple left axillary lymph nodes, at least N1 disease, possible N2. I discussed with her and she has locally advanced disease, and triple-negative breast cancer tends to be more aggressive with early metastasis and cancer recurrence after surgery. -a staging CT showed no evidence of distant metastasis, the subtle increased uptake in the right humerus and lower lumbar spine on the bone scan was further evaluated by x-ray, which were negative. - Dr. Burr Medico recommend neoadjuvant chemotherapy given her very high risk of cancer recurrence after surgery alone. -She has no history of cardiac disease, normal EF on echo,  Dr. Burr Medico recommended dose dense Adriamycin and Cytoxan followed by paclitaxel.  -Given her young age and triple negative disease, positive family history, she was referred to see genetic counselor for genetic testing, her genetic testing was negative.  -Lab reviewed, adequate for treatment, we will continue weekly paclitaxel today. -Repeat breast MRI after she completes neoadjuvant chemotherapy -Dr. Burr Medico discussed the clinical trial [facility] [phone], which randomizes the  patient to assess her lymph node dissection versus radiation after neoadjuvant chemotherapy. She is interested and has been referred to our  clinical trial team.  2. Genetics  -Her genetic testing for inheritable breast cancer was negative  3.Anxiety and coping -she will take her xanax needed -I encouraged her to think positive -She has good support from her parents and friend.   4. Nausea, vomiting, anorexia -secondary to chemotherapy, better with taxol now  -She knows to take Compazine and Zofran as needed -I encouraged her to eat small female but for more frequent, and take nutrition supplement  5. Newly diagnosed type 2 diabetes -Continue Lantus and insulin sliding scale, follow-up with her primary care physician. -She is on low-dose dexamethasone 5 mg before Taxol, steroids induced hyperglycemia discussed. -Due to her hyperglycemia (secondary to incidental dexamethasone last night),She will be given 500 mL normal saline and extra dose insulin at infusion today.   6. Cytopenia secondary to chemo -She received a blood transfusion after the third cycle AC and 5th cycle Taxol  -no lab evidence of hemolysis, or nutritional anemia also. -will continue to monitor closely  Plan -Continue weekly  Taxol, week 9 today. -She will be seen by a nurse practitioner in 1 weeks. She will see Dr. Burr Medico with her last cycle Taxol. -Clinical trial [facility] [phone] screening   She will have her MRI 2-3 weeks after completing her weekly Taxol which should be about mid-August 2016. Surgery would follow within a few weeks of the MRI with radiation therapy starting about one month after surgery.  All questions were answered. The patient knows to call the clinic with any problems, questions or concerns. I spent 20 minutes counseling the patient face to face. The total time spent in the appointment was 25 minutes and more than 50% was on counseling.     Carlton Adam, PA-C 02/20/2015 3:11 PM

## 2015-02-20 NOTE — Telephone Encounter (Signed)
Pt has sched...NO appt changed due to MD on pal 7.22

## 2015-02-20 NOTE — Progress Notes (Signed)
Spiritual Care Note  Followed up with Ashlee Hill in infusion room this morning, providing pastoral presence and reflective listening as she used opportunity to reflect theologically on her health and financial circumstances.  Per pt, she is feeling more of a need to understand the estimated timeline of her treatment trajectory because she is starting to look ahead to future job-seeking needs and recreational hopes.  Served as a Personal assistant partner and witness to her story.  Following for support, but please also page as needs arise.  Thank you.  [place], [place] Pager [phone] VM [phone]

## 2015-02-20 NOTE — Patient Instructions (Signed)
[facility] Discharge Instructions for Patients Receiving Chemotherapy  Today you received the following chemotherapy agents:  Taxol.  To help prevent nausea and vomiting after your treatment, we encourage you to take your nausea medication: Compazine 10 mg every 6 hours as needed, Zofran 8 mg every 8 hours as needed.   If you develop nausea and vomiting that is not controlled by your nausea medication, call the clinic.   BELOW ARE SYMPTOMS THAT SHOULD BE REPORTED IMMEDIATELY:  *FEVER GREATER THAN 100.5 F  *CHILLS WITH OR WITHOUT FEVER  NAUSEA AND VOMITING THAT IS NOT CONTROLLED WITH YOUR NAUSEA MEDICATION  *UNUSUAL SHORTNESS OF BREATH  *UNUSUAL BRUISING OR BLEEDING  TENDERNESS IN MOUTH AND THROAT WITH OR WITHOUT PRESENCE OF ULCERS  *URINARY PROBLEMS  *BOWEL PROBLEMS  UNUSUAL RASH Items with * indicate a potential emergency and should be followed up as soon as possible.  Feel free to call the clinic you have any questions or concerns. The clinic phone number is [phone].  Please show the [facility] at check-in to the Emergency Department and triage nurse.

## 2015-02-25 NOTE — Patient Instructions (Signed)
Continue labs and weekly chemotherapy as scheduled Follow up in 1 week

## 2015-02-27 ENCOUNTER — Other Ambulatory Visit: Payer: Self-pay | Admitting: Nurse Practitioner

## 2015-02-27 ENCOUNTER — Other Ambulatory Visit (HOSPITAL_BASED_OUTPATIENT_CLINIC_OR_DEPARTMENT_OTHER): Payer: 59

## 2015-02-27 ENCOUNTER — Other Ambulatory Visit: Payer: Self-pay | Admitting: Hematology and Oncology

## 2015-02-27 ENCOUNTER — Ambulatory Visit (HOSPITAL_BASED_OUTPATIENT_CLINIC_OR_DEPARTMENT_OTHER): Payer: 59 | Admitting: Nurse Practitioner

## 2015-02-27 ENCOUNTER — Ambulatory Visit (HOSPITAL_BASED_OUTPATIENT_CLINIC_OR_DEPARTMENT_OTHER): Payer: 59

## 2015-02-27 VITALS — BP 146/77 | HR 99 | Temp 99.3°F | Resp 19 | Ht 65.0 in | Wt 230.1 lb

## 2015-02-27 VITALS — BP 149/75 | HR 81 | Temp 98.4°F | Resp 18

## 2015-02-27 DIAGNOSIS — Z5111 Encounter for antineoplastic chemotherapy: Secondary | ICD-10-CM

## 2015-02-27 DIAGNOSIS — C50212 Malignant neoplasm of upper-inner quadrant of left female breast: Secondary | ICD-10-CM

## 2015-02-27 DIAGNOSIS — R040 Epistaxis: Secondary | ICD-10-CM

## 2015-02-27 DIAGNOSIS — D649 Anemia, unspecified: Secondary | ICD-10-CM

## 2015-02-27 LAB — COMPREHENSIVE METABOLIC PANEL (CC13)
ALT: 33 U/L (ref 0–55)
AST: 39 U/L — AB (ref 5–34)
Albumin: 3.7 g/dL (ref 3.5–5.0)
Alkaline Phosphatase: 96 U/L (ref 40–150)
Anion Gap: 12 mEq/L — ABNORMAL HIGH (ref 3–11)
BUN: 12.4 mg/dL (ref 7.0–26.0)
CALCIUM: 9.4 mg/dL (ref 8.4–10.4)
CHLORIDE: 101 meq/L (ref 98–109)
CO2: 23 mEq/L (ref 22–29)
Creatinine: 1.1 mg/dL (ref 0.6–1.1)
EGFR: 59 mL/min/{1.73_m2} — ABNORMAL LOW (ref >90)
Glucose: 203 mg/dl — ABNORMAL HIGH (ref 70–140)
Potassium: 3.7 mEq/L (ref 3.5–5.1)
Sodium: 136 mEq/L (ref 136–145)
Total Bilirubin: 0.97 mg/dL (ref 0.20–1.20)
Total Protein: 6.7 g/dL (ref 6.4–8.3)

## 2015-02-27 LAB — PREPARE RBC (CROSSMATCH)

## 2015-02-27 LAB — CBC WITH DIFFERENTIAL/PLATELET
BASO%: 2.5 % — ABNORMAL HIGH (ref 0.0–2.0)
Basophils Absolute: 0.2 10*3/uL — ABNORMAL HIGH (ref 0.0–0.1)
EOS%: 1.1 % (ref 0.0–7.0)
Eosinophils Absolute: 0.1 10*3/uL (ref 0.0–0.5)
HCT: 22 % — ABNORMAL LOW (ref 34.8–46.6)
HGB: 7.5 g/dL — ABNORMAL LOW (ref 11.6–15.9)
LYMPH%: 27.6 % (ref 14.0–49.7)
MCH: 36.6 pg — AB (ref 25.1–34.0)
MCHC: 34.1 g/dL (ref 31.5–36.0)
MCV: 107.3 fL — ABNORMAL HIGH (ref 79.5–101.0)
MONO#: 0.6 10*3/uL (ref 0.1–0.9)
MONO%: 10.1 % (ref 0.0–14.0)
NEUT%: 58.7 % (ref 38.4–76.8)
NEUTROS ABS: 3.6 10*3/uL (ref 1.5–6.5)
PLATELETS: 198 10*3/uL (ref 145–400)
RBC: 2.05 10*6/uL — ABNORMAL LOW (ref 3.70–5.45)
RDW: 23.9 % — ABNORMAL HIGH (ref 11.2–14.5)
WBC: 6.1 10*3/uL (ref 3.9–10.3)
lymph#: 1.7 10*3/uL (ref 0.9–3.3)

## 2015-02-27 LAB — HOLD TUBE, BLOOD BANK

## 2015-02-27 LAB — TECHNOLOGIST REVIEW

## 2015-02-27 MED ORDER — HEPARIN SOD (PORK) LOCK FLUSH 100 UNIT/ML IV SOLN
500.0000 [IU] | Freq: Once | INTRAVENOUS | Status: AC | PRN
  Administered 2015-02-27: 500 [IU]
  Filled 2015-02-27: qty 5

## 2015-02-27 MED ORDER — SODIUM CHLORIDE 0.9 % IJ SOLN
10.0000 mL | INTRAMUSCULAR | Status: DC | PRN
  Filled 2015-02-27: qty 10

## 2015-02-27 MED ORDER — DIPHENHYDRAMINE HCL 50 MG/ML IJ SOLN
INTRAMUSCULAR | Status: AC
  Filled 2015-02-27: qty 1

## 2015-02-27 MED ORDER — DIPHENHYDRAMINE HCL 50 MG/ML IJ SOLN
50.0000 mg | Freq: Once | INTRAMUSCULAR | Status: AC
  Administered 2015-02-27: 50 mg via INTRAVENOUS

## 2015-02-27 MED ORDER — SODIUM CHLORIDE 0.9 % IV SOLN: 250.0000 mL | Freq: Once | INTRAVENOUS | Status: DC

## 2015-02-27 MED ORDER — SODIUM CHLORIDE 0.9 % IJ SOLN
3.0000 mL | INTRAMUSCULAR | Status: DC | PRN
  Filled 2015-02-27: qty 10

## 2015-02-27 MED ORDER — FAMOTIDINE IN NACL 20-0.9 MG/50ML-% IV SOLN
INTRAVENOUS | Status: AC
  Filled 2015-02-27: qty 50

## 2015-02-27 MED ORDER — PACLITAXEL CHEMO INJECTION 300 MG/50ML
80.0000 mg/m2 | Freq: Once | INTRAVENOUS | Status: AC
  Administered 2015-02-27: 174 mg via INTRAVENOUS
  Filled 2015-02-27: qty 29

## 2015-02-27 MED ORDER — FAMOTIDINE IN NACL 20-0.9 MG/50ML-% IV SOLN
20.0000 mg | Freq: Once | INTRAVENOUS | Status: AC
  Administered 2015-02-27: 20 mg via INTRAVENOUS

## 2015-02-27 MED ORDER — SODIUM CHLORIDE 0.9 % IV SOLN
Freq: Once | INTRAVENOUS | Status: AC
  Administered 2015-02-27: 12:00:00 via INTRAVENOUS
  Filled 2015-02-27: qty 4

## 2015-02-27 MED ORDER — SODIUM CHLORIDE 0.9 % IV SOLN
Freq: Once | INTRAVENOUS | Status: AC
  Administered 2015-02-27: 11:00:00 via INTRAVENOUS

## 2015-02-27 MED ORDER — SODIUM CHLORIDE 0.9 % IJ SOLN
10.0000 mL | INTRAMUSCULAR | Status: DC | PRN
  Administered 2015-02-27: 10 mL
  Filled 2015-02-27: qty 10

## 2015-02-27 MED ORDER — HEPARIN SOD (PORK) LOCK FLUSH 100 UNIT/ML IV SOLN
250.0000 [IU] | INTRAVENOUS | Status: DC | PRN
  Filled 2015-02-27: qty 5

## 2015-02-27 NOTE — Progress Notes (Signed)
Ashlee Hill OFFICE PROGRESS NOTE   Diagnosis: Breast cancer Oncology History   Breast cancer of upper-inner quadrant of left female breast  Staging form: Breast, AJCC 7th Edition  Clinical stage from 10/08/2014: Stage IIB (T2, N1, M0) - Unsigned  Pathologic: Stage IIB (T2, N1, cM0) - Unsigned       Breast cancer of upper-inner quadrant of left female breast   09/26/2014 Breast US 2.5 cm irregular mass in the upper inner left breast with adjacent 1.1 cm satellite mass and enlarged left axillary lymph nodes, highly suspicious for left breast malignancy and left axillary lymph node metastases. Tissue sampling is recommended.    09/26/2014 Mammogram Spot compression views of the left breast and routine views of both breasts demonstrate a 2 x 2.5 cm irregular mass in the upper inner left breast.    09/29/2014 Initial Biopsy 1. Breast, left, needle core biopsy, mass, 11 o'clock - INVASIVE MAMMARY CARCINOMA, SEE COMMENT. - MAMMARY CARCINOMA IN SITU. 2. Lymph node, needle/core biopsy, left axillary - ONE LYMPH NODE, POSITIVE FOR METASTATIC MAMMARY CARCINOMA (1/1).   09/29/2014 Receptors her2 Estrogen Receptor: 0%, NEGATIVE Progesterone Receptor: 0%, NEGATIVE Proliferation Marker Ki67: 93%   10/01/2014 Initial Diagnosis Breast cancer of upper-inner quadrant of left female breast   10/17/2014 Imaging CT and bone scan negative for distant metastasis    10/24/2014 -  Adjuvant Chemotherapy ddACx4 then weekly Taxol X12                          INTERVAL HISTORY:   Ashlee Hill returns as scheduled. She completed cycle 9 weekly Taxol 02/20/2015. No significant nausea/vomiting. No mouth sores. No diarrhea. She is intermittently constipated. She takes Dulcolax as needed. No numbness or tingling in her hands or feet. She  continues to have soreness within the nasal passages and intermittent epistaxis. She feels "weak and tired". She thinks she needs a blood transfusion. She denies shortness of breath.  Objective:  Vital signs in last 24 hours:  Blood pressure 146/77, pulse 99, temperature 99.3 F (37.4 C), temperature source Oral, resp. rate 19, height '5\' 5"'  (1.651 m), weight 230 lb 1.6 oz (104.373 kg), SpO2 100 %.    HEENT: No thrush or ulcers. Dried blood noted within the nasal passages. Resp: Lungs clear bilaterally. Cardio: Regular rate and rhythm. GI: Abdomen soft and nontender. No organomegaly.  Vascular: No leg edema. Neuro: Vibratory sense intact over the fingertips per tuning fork exam.  Skin: No rash. Port-A-Cath without erythema.    Lab Results:  Lab Results  Component Value Date   WBC 6.1 02/27/2015   HGB 7.5* 02/27/2015   HCT 22.0* 02/27/2015   MCV 107.3* 02/27/2015   PLT 198 02/27/2015   NEUTROABS 3.6 02/27/2015    Imaging:  No results found.  Medications: I have reviewed the patient's current medications.  Assessment/Plan: 1. Left breast invasive ductal carcinoma, cT2N1M0, stage IIB, triple negative diagnosed 09/29/2014. Neoadjuvant dose dense Adriamycin/Cytoxan initiated 10/24/2014 with 4 cycles planned to be followed by Taxol weekly 12. Cycle 4 dose dense Adriamycin/Cytoxan completed 12/12/2014. Weekly Taxol initiated 12/26/2014. 2. History of mucositis related to chemotherapy. 3. Question hand-foot syndrome following cycle 3 Adriamycin/Cytoxan. Resolved. 4. Hyperglycemia. Now on insulin. 5. Hospitalization 12/04/2014 through 12/06/2014 with nausea/vomiting/dehydration and hyperglycemia. 6. Anemia, question secondary to chemotherapy. Status post red cell transfusion 01/07/2015, 01/27/2015. 7. Rash right chest wall. Likely contact/allergic dermatitis. Resolved. 8. Epistaxis. Question etiology. We discussed a referral to ENT. She declines  this at  present.   Disposition: Ashlee Hill appears stable. She has completed 9 cycles of weekly Taxol. Plan to proceed with cycle 10 today as scheduled.  She has progressive anemia. Question related to chemotherapy. She is symptomatic. We are arranging for a blood transfusion.  She continues to have intermittent epistaxis. She declines a referral to ENT.  She will return for a follow-up visit and cycle 11 weekly Taxol in one week. She will contact the office in the interim with any problems.    Ned Card ANP/GNP-BC   02/27/2015  10:26 AM

## 2015-02-27 NOTE — Patient Instructions (Addendum)
[facility] Discharge Instructions for Patients Receiving Chemotherapy  Today you received the following chemotherapy agents Taxol  To help prevent nausea and vomiting after your treatment, we encourage you to take your nausea medication as prescribed.  If you develop nausea and vomiting that is not controlled by your nausea medication, call the clinic.   BELOW ARE SYMPTOMS THAT SHOULD BE REPORTED IMMEDIATELY:  *FEVER GREATER THAN 100.5 F  *CHILLS WITH OR WITHOUT FEVER  NAUSEA AND VOMITING THAT IS NOT CONTROLLED WITH YOUR NAUSEA MEDICATION  *UNUSUAL SHORTNESS OF BREATH  *UNUSUAL BRUISING OR BLEEDING  TENDERNESS IN MOUTH AND THROAT WITH OR WITHOUT PRESENCE OF ULCERS  *URINARY PROBLEMS  *BOWEL PROBLEMS  UNUSUAL RASH Items with * indicate a potential emergency and should be followed up as soon as possible.  Feel free to call the clinic you have any questions or concerns. The clinic phone number is [phone].  Please show the [facility] at check-in to the Emergency Department and triage nurse.    Blood Transfusion Information WHAT IS A BLOOD TRANSFUSION? A transfusion is the replacement of blood or some of its parts. Blood is made up of multiple cells which provide different functions.  Red blood cells carry oxygen and are used for blood loss replacement.  White blood cells fight against infection.  Platelets control bleeding.  Plasma helps clot blood.  Other blood products are available for specialized needs, such as hemophilia or other clotting disorders. BEFORE THE TRANSFUSION  Who gives blood for transfusions?   You may be able to donate blood to be used at a later date on yourself (autologous donation).  Relatives can be asked to donate blood. This is generally not any safer than if you have received blood from a stranger. The same precautions are taken to ensure safety when a relative's blood is donated.  Healthy volunteers who are  fully evaluated to make sure their blood is safe. This is blood bank blood. Transfusion therapy is the safest it has ever been in the practice of medicine. Before blood is taken from a donor, a complete history is taken to make sure that person has no history of diseases nor engages in risky social behavior (examples are intravenous drug use or sexual activity with multiple partners). The donor's travel history is screened to minimize risk of transmitting infections, such as malaria. The donated blood is tested for signs of infectious diseases, such as HIV and hepatitis. The blood is then tested to be sure it is compatible with you in order to minimize the chance of a transfusion reaction. If you or a relative donates blood, this is often done in anticipation of surgery and is not appropriate for emergency situations. It takes many days to process the donated blood. RISKS AND COMPLICATIONS Although transfusion therapy is very safe and saves many lives, the main dangers of transfusion include:   Getting an infectious disease.  Developing a transfusion reaction. This is an allergic reaction to something in the blood you were given. Every precaution is taken to prevent this. The decision to have a blood transfusion has been considered carefully by your caregiver before blood is given. Blood is not given unless the benefits outweigh the risks. AFTER THE TRANSFUSION  Right after receiving a blood transfusion, you will usually feel much better and more energetic. This is especially true if your red blood cells have gotten low (anemic). The transfusion raises the level of the red blood cells which carry oxygen, and  this usually causes an energy increase.  The nurse administering the transfusion will monitor you carefully for complications. HOME CARE INSTRUCTIONS  No special instructions are needed after a transfusion. You may find your energy is better. Speak with your caregiver about any limitations on  activity for underlying diseases you may have. SEEK MEDICAL CARE IF:   Your condition is not improving after your transfusion.  You develop redness or irritation at the intravenous (IV) site. SEEK IMMEDIATE MEDICAL CARE IF:  Any of the following symptoms occur over the next 12 hours:  Shaking chills.  You have a temperature by mouth above 102 F (38.9 C), not controlled by medicine.  Chest, back, or muscle pain.  People around you feel you are not acting correctly or are confused.  Shortness of breath or difficulty breathing.  Dizziness and fainting.  You get a rash or develop hives.  You have a decrease in urine output.  Your urine turns a dark color or changes to pink, red, or brown. Any of the following symptoms occur over the next 10 days:  You have a temperature by mouth above 102 F (38.9 C), not controlled by medicine.  Shortness of breath.  Weakness after normal activity.  The white part of the eye turns yellow (jaundice).  You have a decrease in the amount of urine or are urinating less often.  Your urine turns a dark color or changes to pink, red, or brown. Document Released: 08/05/2000 Document Revised: 10/31/2011 Document Reviewed: 03/24/2008 [facility] Patient Information 2015 [place], [place]. This information is not intended to replace advice given to you by your health care provider. Make sure you discuss any questions you have with your health care provider.

## 2015-02-28 ENCOUNTER — Other Ambulatory Visit: Payer: Self-pay | Admitting: Hematology

## 2015-03-01 LAB — TYPE AND SCREEN
ABO/RH(D): O NEG
Antibody Screen: NEGATIVE
Unit division: 0
Unit division: 0

## 2015-03-05 ENCOUNTER — Other Ambulatory Visit: Payer: Self-pay | Admitting: *Deleted

## 2015-03-06 ENCOUNTER — Other Ambulatory Visit: Payer: Self-pay | Admitting: Hematology

## 2015-03-06 ENCOUNTER — Ambulatory Visit (HOSPITAL_BASED_OUTPATIENT_CLINIC_OR_DEPARTMENT_OTHER): Payer: 59 | Admitting: Physician Assistant

## 2015-03-06 ENCOUNTER — Ambulatory Visit (HOSPITAL_BASED_OUTPATIENT_CLINIC_OR_DEPARTMENT_OTHER): Payer: 59

## 2015-03-06 ENCOUNTER — Encounter: Payer: Self-pay | Admitting: Physician Assistant

## 2015-03-06 ENCOUNTER — Encounter: Payer: Self-pay | Admitting: *Deleted

## 2015-03-06 ENCOUNTER — Other Ambulatory Visit (HOSPITAL_BASED_OUTPATIENT_CLINIC_OR_DEPARTMENT_OTHER): Payer: 59

## 2015-03-06 VITALS — HR 99

## 2015-03-06 VITALS — BP 143/84 | HR 112 | Temp 98.3°F | Resp 18 | Ht 65.0 in | Wt 231.0 lb

## 2015-03-06 DIAGNOSIS — Z171 Estrogen receptor negative status [ER-]: Secondary | ICD-10-CM

## 2015-03-06 DIAGNOSIS — R040 Epistaxis: Secondary | ICD-10-CM

## 2015-03-06 DIAGNOSIS — C50212 Malignant neoplasm of upper-inner quadrant of left female breast: Secondary | ICD-10-CM | POA: Diagnosis not present

## 2015-03-06 DIAGNOSIS — Z5111 Encounter for antineoplastic chemotherapy: Secondary | ICD-10-CM

## 2015-03-06 DIAGNOSIS — D649 Anemia, unspecified: Secondary | ICD-10-CM | POA: Diagnosis not present

## 2015-03-06 DIAGNOSIS — D61810 Antineoplastic chemotherapy induced pancytopenia: Secondary | ICD-10-CM | POA: Diagnosis not present

## 2015-03-06 DIAGNOSIS — R112 Nausea with vomiting, unspecified: Secondary | ICD-10-CM

## 2015-03-06 DIAGNOSIS — C773 Secondary and unspecified malignant neoplasm of axilla and upper limb lymph nodes: Secondary | ICD-10-CM | POA: Diagnosis not present

## 2015-03-06 LAB — CBC WITH DIFFERENTIAL/PLATELET
BASO%: 0.9 % (ref 0.0–2.0)
BASOS ABS: 0 10*3/uL (ref 0.0–0.1)
EOS%: 1.2 % (ref 0.0–7.0)
Eosinophils Absolute: 0.1 10*3/uL (ref 0.0–0.5)
HCT: 28.1 % — ABNORMAL LOW (ref 34.8–46.6)
HGB: 9.6 g/dL — ABNORMAL LOW (ref 11.6–15.9)
LYMPH%: 17.7 % (ref 14.0–49.7)
MCH: 34.8 pg — ABNORMAL HIGH (ref 25.1–34.0)
MCHC: 34.2 g/dL (ref 31.5–36.0)
MCV: 101.8 fL — ABNORMAL HIGH (ref 79.5–101.0)
MONO#: 0.4 10*3/uL (ref 0.1–0.9)
MONO%: 9.7 % (ref 0.0–14.0)
NEUT#: 3 10*3/uL (ref 1.5–6.5)
NEUT%: 70.5 % (ref 38.4–76.8)
NRBC: 9 % — AB (ref 0–0)
Platelets: 202 10*3/uL (ref 145–400)
RBC: 2.76 10*6/uL — AB (ref 3.70–5.45)
RDW: 23.2 % — AB (ref 11.2–14.5)
WBC: 4.2 10*3/uL (ref 3.9–10.3)
lymph#: 0.8 10*3/uL — ABNORMAL LOW (ref 0.9–3.3)

## 2015-03-06 LAB — COMPREHENSIVE METABOLIC PANEL (CC13)
ALK PHOS: 112 U/L (ref 40–150)
ALT: 33 U/L (ref 0–55)
AST: 31 U/L (ref 5–34)
Albumin: 3.6 g/dL (ref 3.5–5.0)
Anion Gap: 11 mEq/L (ref 3–11)
BUN: 10.4 mg/dL (ref 7.0–26.0)
CHLORIDE: 103 meq/L (ref 98–109)
CO2: 24 mEq/L (ref 22–29)
CREATININE: 1 mg/dL (ref 0.6–1.1)
Calcium: 9.5 mg/dL (ref 8.4–10.4)
EGFR: 63 mL/min/{1.73_m2} — AB (ref >90)
GLUCOSE: 206 mg/dL — AB (ref 70–140)
POTASSIUM: 3.5 meq/L (ref 3.5–5.1)
Sodium: 138 mEq/L (ref 136–145)
TOTAL PROTEIN: 6.9 g/dL (ref 6.4–8.3)
Total Bilirubin: 0.94 mg/dL (ref 0.20–1.20)

## 2015-03-06 MED ORDER — HEPARIN SOD (PORK) LOCK FLUSH 100 UNIT/ML IV SOLN
500.0000 [IU] | Freq: Once | INTRAVENOUS | Status: AC | PRN
  Administered 2015-03-06: 500 [IU]
  Filled 2015-03-06: qty 5

## 2015-03-06 MED ORDER — SODIUM CHLORIDE 0.9 % IV SOLN
Freq: Once | INTRAVENOUS | Status: AC
  Administered 2015-03-06: 10:00:00 via INTRAVENOUS

## 2015-03-06 MED ORDER — FAMOTIDINE IN NACL 20-0.9 MG/50ML-% IV SOLN
INTRAVENOUS | Status: AC
  Filled 2015-03-06: qty 50

## 2015-03-06 MED ORDER — DIPHENHYDRAMINE HCL 50 MG/ML IJ SOLN
INTRAMUSCULAR | Status: AC
  Filled 2015-03-06: qty 1

## 2015-03-06 MED ORDER — SODIUM CHLORIDE 0.9 % IV SOLN
80.0000 mg/m2 | Freq: Once | INTRAVENOUS | Status: AC
  Administered 2015-03-06: 174 mg via INTRAVENOUS
  Filled 2015-03-06: qty 29

## 2015-03-06 MED ORDER — DIPHENHYDRAMINE HCL 50 MG/ML IJ SOLN
50.0000 mg | Freq: Once | INTRAMUSCULAR | Status: AC
  Administered 2015-03-06: 50 mg via INTRAVENOUS

## 2015-03-06 MED ORDER — FAMOTIDINE IN NACL 20-0.9 MG/50ML-% IV SOLN
20.0000 mg | Freq: Once | INTRAVENOUS | Status: AC
  Administered 2015-03-06: 20 mg via INTRAVENOUS

## 2015-03-06 MED ORDER — DEXAMETHASONE SODIUM PHOSPHATE 100 MG/10ML IJ SOLN
Freq: Once | INTRAMUSCULAR | Status: AC
  Administered 2015-03-06: 10:00:00 via INTRAVENOUS
  Filled 2015-03-06: qty 4

## 2015-03-06 MED ORDER — SODIUM CHLORIDE 0.9 % IJ SOLN
10.0000 mL | INTRAMUSCULAR | Status: DC | PRN
  Administered 2015-03-06: 10 mL
  Filled 2015-03-06: qty 10

## 2015-03-06 NOTE — Patient Instructions (Signed)
[facility] Discharge Instructions for Patients Receiving Chemotherapy  Today you received the following chemotherapy agents Taxol   To help prevent nausea and vomiting after your treatment, we encourage you to take your nausea medication as directed.   If you develop nausea and vomiting that is not controlled by your nausea medication, call the clinic.   BELOW ARE SYMPTOMS THAT SHOULD BE REPORTED IMMEDIATELY:  *FEVER GREATER THAN 100.5 F  *CHILLS WITH OR WITHOUT FEVER  NAUSEA AND VOMITING THAT IS NOT CONTROLLED WITH YOUR NAUSEA MEDICATION  *UNUSUAL SHORTNESS OF BREATH  *UNUSUAL BRUISING OR BLEEDING  TENDERNESS IN MOUTH AND THROAT WITH OR WITHOUT PRESENCE OF ULCERS  *URINARY PROBLEMS  *BOWEL PROBLEMS  UNUSUAL RASH Items with * indicate a potential emergency and should be followed up as soon as possible.  Feel free to call the clinic you have any questions or concerns. The clinic phone number is [phone].  Please show the CHEMO ALERT CARD at check-in to the Emergency Department and triage nurse.   

## 2015-03-06 NOTE — Progress Notes (Signed)
[facility] OFFICE PROGRESS NOTE   Diagnosis: Breast cancer Oncology History   Breast cancer of upper-inner quadrant of left female breast  Staging form: Breast, AJCC 7th Edition  Clinical stage from 10/08/2014: Stage IIB (T2, N1, M0) - Unsigned  Pathologic: Stage IIB (T2, N1, cM0) - Unsigned       Breast cancer of upper-inner quadrant of left female breast   09/26/2014 Breast US 2.5 cm irregular mass in the upper inner left breast with adjacent 1.1 cm satellite mass and enlarged left axillary lymph nodes, highly suspicious for left breast malignancy and left axillary lymph node metastases. Tissue sampling is recommended.    09/26/2014 Mammogram Spot compression views of the left breast and routine views of both breasts demonstrate a 2 x 2.5 cm irregular mass in the upper inner left breast.    09/29/2014 Initial Biopsy 1. Breast, left, needle core biopsy, mass, 11 o'clock - INVASIVE MAMMARY CARCINOMA, SEE COMMENT. - MAMMARY CARCINOMA IN SITU. 2. Lymph node, needle/core biopsy, left axillary - ONE LYMPH NODE, POSITIVE FOR METASTATIC MAMMARY CARCINOMA (1/1).   09/29/2014 Receptors her2 Estrogen Receptor: 0%, NEGATIVE Progesterone Receptor: 0%, NEGATIVE Proliferation Marker Ki67: 93%   10/01/2014 Initial Diagnosis Breast cancer of upper-inner quadrant of left female breast   10/17/2014 Imaging CT and bone scan negative for distant metastasis    10/24/2014 -  Adjuvant Chemotherapy [id number] then weekly Taxol X12                          INTERVAL HISTORY:   Ashlee Hill returns as scheduled. She completed cycle 10 weekly Taxol 02/27/2015. No significant nausea/vomiting. What symptoms she has is reasonably controlled with Compazine and Zofran. She requests refills for both of these medications. She continues to  have some fatigue and constipation. The constipation is a long-standing issue and is reasonably controlled with Dulcolax. He needs to have difficulty sleeping that is well managed with her current medications. No mouth sores. No diarrhea. No numbness or tingling in her hands or feet. She continues to have soreness within the nasal passages and intermittent epistaxis.  She denies shortness of breath.  Objective:  Vital signs in last 24 hours:  Blood pressure 143/84, pulse 112, temperature 98.3 F (36.8 C), temperature source Oral, resp. rate 18, height '5\' 5"'  (1.651 m), weight 231 lb (104.781 kg), SpO2 99 %.    HEENT: No thrush or ulcers. Dried blood noted within the nasal passages. Resp: Lungs clear bilaterally. Cardio: Regular rate and rhythm. GI: Abdomen soft and nontender. No organomegaly.  Vascular: No leg edema. Neuro: Vibratory sense intact over the fingertips per tuning fork exam.  Skin: No rash. Port-A-Cath without erythema.    Lab Results:  Lab Results  Component Value Date   WBC 4.2 03/06/2015   HGB 9.6* 03/06/2015   HCT 28.1* 03/06/2015   MCV 101.8* 03/06/2015   PLT 202 03/06/2015   NEUTROABS 3.0 03/06/2015    Imaging:  No results found.  Medications: I have reviewed the patient's current medications.  Assessment/Plan: 1. Left breast invasive ductal carcinoma, cT2N1M0, stage IIB, triple negative diagnosed 09/29/2014. Neoadjuvant dose dense Adriamycin/Cytoxan initiated 10/24/2014 with 4 cycles planned to be followed by Taxol weekly 12. Cycle 4 dose dense Adriamycin/Cytoxan completed 12/12/2014. Weekly Taxol initiated 12/26/2014. 2. History of mucositis related to chemotherapy. 3. Question hand-foot syndrome following cycle 3 Adriamycin/Cytoxan. Resolved. 4. Hyperglycemia. Now on insulin. 5. Hospitalization 12/04/2014 through 12/06/2014 with nausea/vomiting/dehydration and hyperglycemia. 6. Anemia, question  secondary to chemotherapy. Status post red cell  transfusion 01/07/2015, 01/27/2015. 7. Rash right chest wall. Likely contact/allergic dermatitis. Resolved. 8. Epistaxis. Question etiology. We discussed a referral to ENT. She declines this at present.   Disposition: Ashlee Hill appears stable. She has completed 10 cycles of weekly Taxol. Plan to proceed with cycle 11 today as scheduled.  Her chemotherapy-induced anemia has resolved after packed red blood cell transfusion. Her hemoglobin today is 9.6 g/dL. We'll continue to monitor this closely she could completes her 12 weeks of weekly Taxol. She'll follow-up in one week with Dr. Burr Medico prior to completing week 12.   She continues to have intermittent epistaxis. She declines a referral to ENT.  She will contact the office in the interim with any problems.    Carlton Adam, PA-C  03/06/2015  2:24 PM

## 2015-03-12 ENCOUNTER — Other Ambulatory Visit (HOSPITAL_BASED_OUTPATIENT_CLINIC_OR_DEPARTMENT_OTHER): Payer: 59

## 2015-03-12 ENCOUNTER — Ambulatory Visit (HOSPITAL_BASED_OUTPATIENT_CLINIC_OR_DEPARTMENT_OTHER): Payer: 59 | Admitting: Hematology

## 2015-03-12 ENCOUNTER — Encounter: Payer: Self-pay | Admitting: Hematology

## 2015-03-12 VITALS — BP 158/92 | HR 112 | Temp 99.1°F | Resp 18 | Ht 65.0 in | Wt 227.7 lb

## 2015-03-12 DIAGNOSIS — C50212 Malignant neoplasm of upper-inner quadrant of left female breast: Secondary | ICD-10-CM

## 2015-03-12 LAB — COMPREHENSIVE METABOLIC PANEL (CC13)
ALBUMIN: 3.8 g/dL (ref 3.5–5.0)
ALK PHOS: 83 U/L (ref 40–150)
ALT: 61 U/L — ABNORMAL HIGH (ref 0–55)
AST: 94 U/L — ABNORMAL HIGH (ref 5–34)
Anion Gap: 11 mEq/L (ref 3–11)
BILIRUBIN TOTAL: 1.43 mg/dL — AB (ref 0.20–1.20)
BUN: 11.3 mg/dL (ref 7.0–26.0)
CO2: 21 mEq/L — ABNORMAL LOW (ref 22–29)
Calcium: 9.8 mg/dL (ref 8.4–10.4)
Chloride: 103 mEq/L (ref 98–109)
Creatinine: 0.9 mg/dL (ref 0.6–1.1)
EGFR: 76 mL/min/{1.73_m2} — AB (ref >90)
Glucose: 165 mg/dl — ABNORMAL HIGH (ref 70–140)
Potassium: 3.8 mEq/L (ref 3.5–5.1)
SODIUM: 136 meq/L (ref 136–145)
TOTAL PROTEIN: 7 g/dL (ref 6.4–8.3)

## 2015-03-12 LAB — CBC WITH DIFFERENTIAL/PLATELET
BASO%: 1.2 % (ref 0.0–2.0)
BASOS ABS: 0.1 10*3/uL (ref 0.0–0.1)
EOS%: 1 % (ref 0.0–7.0)
Eosinophils Absolute: 0 10*3/uL (ref 0.0–0.5)
HCT: 26.7 % — ABNORMAL LOW (ref 34.8–46.6)
HGB: 9.2 g/dL — ABNORMAL LOW (ref 11.6–15.9)
LYMPH%: 14.8 % (ref 14.0–49.7)
MCH: 35 pg — AB (ref 25.1–34.0)
MCHC: 34.5 g/dL (ref 31.5–36.0)
MCV: 101.5 fL — AB (ref 79.5–101.0)
MONO#: 0.4 10*3/uL (ref 0.1–0.9)
MONO%: 9.3 % (ref 0.0–14.0)
NEUT#: 3.1 10*3/uL (ref 1.5–6.5)
NEUT%: 73.7 % (ref 38.4–76.8)
Platelets: 223 10*3/uL (ref 145–400)
RBC: 2.63 10*6/uL — AB (ref 3.70–5.45)
RDW: 23.2 % — AB (ref 11.2–14.5)
WBC: 4.2 10*3/uL (ref 3.9–10.3)
lymph#: 0.6 10*3/uL — ABNORMAL LOW (ref 0.9–3.3)
nRBC: 4 % — ABNORMAL HIGH (ref 0–0)

## 2015-03-12 NOTE — Patient Instructions (Signed)
Follow-up in one week prior to week #12 of your weekly Taxol

## 2015-03-12 NOTE — Progress Notes (Signed)
1 week [facility]  Telephone:([phone] Fax:([phone] Echo Note   Patient Care Team: Seward Carol, MD as PCP - General (Internal Medicine) Erroll Luna, MD as Consulting Physician (General Surgery) Truitt Merle, MD as Consulting Physician (Hematology) Thea Silversmith, MD as Consulting Physician (Radiation Oncology) Rockwell Germany, RN as Registered Nurse Mauro Kaufmann, RN as Registered Nurse 03/12/2015  CHIEF COMPLAINTS  Follow up breast cancer   Oncology History   Breast cancer of upper-inner quadrant of left female breast   Staging form: Breast, AJCC 7th Edition     Clinical stage from 10/08/2014: Stage IIB (T2, N1, M0) - Unsigned     Pathologic: Stage IIB (T2, N1, cM0) - Unsigned       Breast cancer of upper-inner quadrant of left female breast   09/26/2014 Breast US 2.5 cm irregular mass in the upper inner left breast with adjacent 1.1 cm satellite mass and enlarged left axillary lymph nodes, highly suspicious for left breast malignancy and left axillary lymph node metastases. Tissue sampling is recommended.    09/26/2014 Mammogram Spot compression views of the left breast and routine views of both breasts demonstrate a 2 x 2.5 cm irregular mass in the upper inner left breast.    09/29/2014 Initial Biopsy 1. Breast, left, needle core biopsy, mass, 11 o'clock - INVASIVE MAMMARY CARCINOMA, SEE COMMENT. - MAMMARY CARCINOMA IN SITU. 2. Lymph node, needle/core biopsy, left axillary - ONE LYMPH NODE, POSITIVE FOR METASTATIC MAMMARY CARCINOMA (1/1).   09/29/2014 Receptors her2 Estrogen Receptor: 0%, NEGATIVE Progesterone Receptor: 0%, NEGATIVE, HER2 negative,  Proliferation Marker Ki67: 93%   10/01/2014 Initial Diagnosis Breast cancer of upper-inner quadrant of left female breast   10/17/2014 Imaging CT and bone scan negative for distant metastasis    10/24/2014 -  Adjuvant Chemotherapy ddACx4 then weekly Taxol X12   11/14/2014 Miscellaneous Genetic testing  BreastNext @ [facility] was negative except 2 variants with unknown significance: ATM p.[id number] and RAD51C p.I52L     HISTORY OF PRESENTING ILLNESS:  Ashlee Hill 54 y.o. female presents to our multidisciplinary breast clinic today to discuss the management of her newly diagnosed breast cancer  She noticed a left breast lump in August 2015, no tenderness, skin or nipple change. She otherwise felt well. She did not seek immediate medical attention due to lack of insurance. She finally got her insurance approved and saw her primary care physician recently. She was referred for mammogram which showed a 2.5 cm irregular mass in the upper inner left breast. She underwent left breast mass and axillary node biopsy on 09/29/2014 and both biopsy showed invasive ductal carcinoma, ER negative, PR negative, HER-2 negative.  She otherwise feels well, no pain or ther complains. She has good appetite and her weight is stable.  CURRENT THERAPY: Neoadjuvant chemotherapy with ddAC every 2 weeks x4, followed by weekly Taxol 12, started on 10/24/2058, Taxol started on 12/26/14  INTERIM HISTORY; [name] returns for follow-up. She is scheduled for last cycle Taxol tomorrow. She is doing relatively well, no significant nausea, vomiting, or peripheral neuropathy. She does complain of moderate fatigue, able to tolerate daily activity, but feels exhausted. No fever or chills. She denies any abdominal discomfort.  MEDICAL HISTORY:  Past Medical History  Diagnosis Date  . Breast cancer of upper-inner quadrant of left female breast 10/01/2014  . Hypertension   . Family history of breast cancer   . Wears glasses     driving  . Diabetes mellitus without  complication 9/93/57    chemo caused diabetes per pt     SURGICAL HISTORY: Past Surgical History  Procedure Laterality Date  . Tonsillectomy    . Wisdom tooth extraction    . Portacath placement Right 10/21/2014    Procedure: INSERTION PORT-A-CATH;  Surgeon: Erroll Luna, MD;  Location: [place];  Service: General;  Laterality: Right;     GYN HISTORY  Menarchal: 11 LMP: 09/27/2014 Contraceptive: no HRT:  G0P0    SOCIAL HISTORY: History   Social History  . Marital Status: Single    Spouse Name: N/A  . Number of Children: N/A  . Years of Education: N/A   Occupational History  . Not on file.   Social History Main Topics  . Smoking status: Never Smoker   . Smokeless tobacco: Never Used  . Alcohol Use: 0.6 oz/week    1 Glasses of wine per week     Comment: socail drinker  . Drug Use: No  . Sexual Activity: Not on file   Other Topics Concern  . Not on file   Social History Narrative    FAMILY HISTORY: Family History  Problem Relation Age of Onset  . Prostate cancer Father 32    currently 24  . Breast cancer Paternal Aunt 67    deceased 28  . Lung cancer Maternal Grandfather 40    smoker; deceased  . Thyroid cancer Cousin 59    pat first cousin; currently 47     Father had prostate cancer at age of 4 Paternal aunt had breast caner in her 59's Paternal cousin had thyroid cancer at age of before 67 Maternal grandfather had lung cancer    ALLERGIES:  is allergic to tegaderm ag mesh.  MEDICATIONS:  Current Outpatient Prescriptions  Medication Sig Dispense Refill  . ALPRAZolam (XANAX) 1 MG tablet Take 1-2 mg by mouth 2 (two) times daily. Take 1 tablet (1 mg) in the morning & Take 2 tablets (2 mg) in the evening.    . Alum & Mag Hydroxide-Simeth (MAGIC MOUTHWASH W/LIDOCAINE) SOLN Take 5 mLs by mouth 4 (four) times daily as needed for mouth pain. 120 mL 1  . aspirin 81 MG tablet Take 81 mg by mouth daily.    Marland Kitchen atorvastatin (LIPITOR) 10 MG tablet Take 10 mg by mouth daily.    . B-D UF III MINI PEN NEEDLES 31G X 5 MM MISC   0  . bisacodyl (DULCOLAX) 5 MG EC tablet Take 5 mg by mouth daily as needed for moderate constipation.    . calcium carbonate (OS-CAL) 600 MG TABS tablet Take 600 mg by mouth daily with  breakfast.    . dexamethasone (DECADRON) 4 MG tablet 1/2 tablet (2 mg) Q AM for nausea PRN. 10 tablet 0  . gabapentin (NEURONTIN) 600 MG tablet Take 600 mg by mouth at bedtime. As needed for sleep or anxiety    . hydrochlorothiazide (HYDRODIURIL) 25 MG tablet Take 25 mg by mouth daily.     . insulin aspart (NOVOLOG) 100 UNIT/ML injection Inject into the skin 3 (three) times daily before meals. Just started-sliding scale per Dr Delfina Redwood    . LANTUS SOLOSTAR 100 UNIT/ML Solostar Pen Inject 30 Units into the skin at bedtime.   0  . LORazepam (ATIVAN) 1 MG tablet take 1 tablet by mouth at bedtime if needed for insomnia 30 tablet 0  . menthol-cetylpyridinium (CEPACOL) 3 MG lozenge Take 1 lozenge by mouth every 2 (two) hours as needed for  sore throat (sore throat).    . metoprolol (LOPRESSOR) 100 MG tablet Take 100 mg by mouth daily.    . ondansetron (ZOFRAN) 8 MG tablet Take 1 tablet (8 mg total) by mouth every 8 (eight) hours as needed for nausea. Start on the third day after chemotherapy. 30 tablet 1  . ONE TOUCH ULTRA TEST test strip TEST BLOOD SUGAR AS DIRECTED THREE TIMES A DAY  0  . ONETOUCH DELICA LANCETS 41P MISC use to STICK FINGER three times a day as directed  0  . oxyCODONE-acetaminophen (ROXICET) 5-325 MG per tablet Take 1-2 tablets by mouth every 4 (four) hours as needed. 30 tablet 0  . PRESCRIPTION MEDICATION Chemo [facility]    . prochlorperazine (COMPAZINE) 10 MG tablet Take 1 tablet (10 mg total) by mouth every 6 (six) hours as needed for nausea or vomiting. 30 tablet 1  . venlafaxine XR (EFFEXOR-XR) 150 MG 24 hr capsule Take 150 mg by mouth daily with breakfast.    . zolpidem (AMBIEN) 10 MG tablet Take 10 mg by mouth at bedtime as needed for sleep.     No current facility-administered medications for this visit.    REVIEW OF SYSTEMS:   Constitutional: Denies fevers, chills or abnormal night sweats Eyes: Denies blurriness of vision, double vision or watery eyes Ears, nose, mouth, throat,  and face: resolving mucositis and sore throat Respiratory: Denies cough, dyspnea or wheezes Cardiovascular: Denies palpitation, chest discomfort or lower extremity swelling Gastrointestinal:  Denies nausea, heartburn or change in bowel habits Skin: Denies abnormal skin rashes Lymphatics: Denies new lymphadenopathy or easy bruising Neurological:Denies numbness, tingling or new weaknesses Behavioral/Psych: Mood is stable, no new changes  All other systems were reviewed with the patient and are negative.  PHYSICAL EXAMINATION: ECOG PERFORMANCE STATUS: 1  Filed Vitals:   03/12/15 1432  BP: 158/92  Pulse: 112  Temp: 99.1 F (37.3 C)  Resp: 18   Filed Weights   03/12/15 1432  Weight: 227 lb 11.2 oz (103.284 kg)    GENERAL:alert, no distress and comfortable SKIN: skin color, texture, turgor are normal, no rashes or significant lesions. Port site looks clean, (+) skin peeling on the bottom of left feet EYES: normal, conjunctiva are pink and non-injected, sclera clear OROPHARYNX:no exudate, no erythema and lips, buccal mucosa, and tongue normal  NECK: supple, thyroid normal size, non-tender, without nodularity LYMPH:  no palpable lymphadenopathy in the cervical, axillary or inguinal LUNGS: clear to auscultation and percussion with normal breathing effort HEART: regular rate & rhythm and no murmurs and no lower extremity edema ABDOMEN:abdomen soft, non-tender and normal bowel sounds Musculoskeletal:no cyanosis of digits and no clubbing  PSYCH: alert & oriented x 3 with fluent speech NEURO: no focal motor/sensory deficits Breasts: Breast inspection showed them to be symmetrical with no nipple discharge. Palpation of the left breasts showed a tiny residual small mass in the upper mid quadrant (much smaller than before), no palpable axillary lymph node. Exam of the right breast and axillary was normal.    LABORATORY DATA:  I have reviewed the data as listed CBC Latest Ref Rng 03/12/2015  03/06/2015 02/27/2015  WBC 3.9 - 10.3 10e3/uL 4.2 4.2 6.1  Hemoglobin 11.6 - 15.9 g/dL 9.2(L) 9.6(L) 7.5(L)  Hematocrit 34.8 - 46.6 % 26.7(L) 28.1(L) 22.0(L)  Platelets 145 - 400 10e3/uL 223 202 198     Recent Labs  12/04/14 1134 12/04/14 1820 12/05/14 0520 12/06/14 0530  02/27/15 0940 03/06/15 0845 03/12/15 1404  NA 129*  --  138 138  < > 136 138 136  K 3.6  --  3.3* 4.1  < > 3.7 3.5 3.8  CL 91*  --  103 106  --   --   --   --   CO2 25  --  27 24  < > 23 24 21*  GLUCOSE 422*  --  227* 204*  < > 203* 206* 165*  BUN 10  --  7 <5*  < > 12.4 10.4 11.3  CREATININE 1.27*  --  1.03 0.79  < > 1.1 1.0 0.9  CALCIUM 8.8  --  8.1* 7.7*  < > 9.4 9.5 9.8  GFRNONAA 48*  --  61* >90  --   --   --   --   GFRAA 55*  --  71* >90  --   --   --   --   PROT 7.5  --  5.9*  --   < > 6.7 6.9 7.0  ALBUMIN 4.5  --  3.3*  --   < > 3.7 3.6 3.8  AST 36  --  28  --   < > 39* 31 94*  ALT 28  --  22  --   < > 33 33 61*  ALKPHOS 92  --  62  --   < > 96 112 83  BILITOT 1.3* 0.9 0.8  --   < > 0.97 0.94 1.43*  BILIDIR  --  0.2  --   --   --   --   --   --   IBILI  --  0.7  --   --   --   --   --   --   < > = values in this interval not displayed.  PATHOLOGY REPORT 09/26/2014 Diagnosis 1. Breast, left, needle core biopsy, mass, 11 o'clock - INVASIVE MAMMARY CARCINOMA, SEE COMMENT. - MAMMARY CARCINOMA IN SITU. 2. Lymph node, needle/core biopsy, left axillary - ONE LYMPH NODE, POSITIVE FOR METASTATIC MAMMARY CARCINOMA (1/1). Microscopic Comment 1. Although grade of tumor is best assessed at resection, with these biopsies, both the in situ and invasive carcinoma are grade II. The invasive carcinoma demonstrates strong diffuse E-cadherin expression; supporting a ductal phenotype. 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  Estrogen Receptor: 0%, NEGATIVE Progesterone Receptor:  0%, NEGATIVE Proliferation Marker Ki67: 93% 1. A sample was sent to [facility] for HER-2 testing by FISH. The results are as follows: Negative.  RADIOGRAPHIC STUDIES: I have personally reviewed the radiological images as listed and agreed with the findings in the report.  Mr Breast Bilateral W Wo Contrast 10/08/2014    IMPRESSION: 1. Biopsy proven malignancy in the upper inner left breast with adjacent/contiguous areas of nodularity measures up to 3.5 cm, with multiple morphologically abnormal axillary lymph nodes compatible with known axillary metastases.  2. No MRI evidence of malignancy in the right breast.  RECOMMENDATION: Treatment plan for known left breast malignancy.  BI-RADS CATEGORY  6: Known biopsy-proven malignancy.   Electronically Signed   By: Everlean Alstrom M.D.   On: 10/08/2014 10:29   US Breast Ltd Uni Left Inc Axilla 09/26/2014    IMPRESSION: 2.5 cm irregular mass in the upper inner left breast with adjacent 1.1 cm satellite mass and enlarged left axillary lymph nodes, highly suspicious for left breast malignancy and left axillary lymph node metastases. Tissue sampling is recommended.  No mammographic evidence of right breast malignancy.    CT chest, abdomen and pelvis with contrast 10/17/2014 IMPRESSION: 1. Left breast lesion is identified compatible with the clinical history of breast cancer. 2. Enlarged left axillary lymph nodes suspicious for metastatic adenopathy. 3. No specific features identified to suggest distant metastatic disease. 4. Hepatic steatosis.  Bone scan 10/17/2014 IMPRESSION: 1. The uptake pattern within the skeleton is not highly suspicious for malignancy. However, subtle increased uptake in the midshaft of the right humerus and the lower lumbar spine posteriorly merits further evaluation with plain films. 2. The uptake over the lower extremities is consistent with degenerative change.  Lumbar spine X-ray 10/20/14 IMPRESSION: No lytic or  sclerotic osseous lesion.  Probable degenerative facet osteoarthritic change at L4-L5 and L5-S1 which may correspond to the area of abnormal uptake at recent bone scan.  RIGHT HUMERUS X-RAY 10/20/2014 IMPRESSION: No abnormality seen to correspond to abnormal uptake seen on bone scan   ASSESSMENT & PLAN:  54 year old pre-menopausal female with past medical history of hypertension, presented with a palpable left breast mass.  1. Left breast invasive ductal carcinoma, cT2N1M0, stage IIB, triple negative  -I discussed her all imaging finding and biopsy results extensively with patient. Her ultrasound and MRI breast reviewed multiple left axillary lymph nodes, at least N1 disease, possible N2. I discussed with her and she has locally advanced disease, and triple-negative breast cancer tends to be more aggressive with early metastasis and cancer recurrence after surgery. -a staging CT showed no evidence of distant metastasis, the subtle increased uptake in the right humerus and lower lumbar spine on the bone scan was further evaluated by x-ray, which were negative. - I recommend neoadjuvant chemotherapy with dose dense before meals followed by weekly Taxol, given her very high risk of cancer recurrence after surgery alone. -She has no history of cardiac disease, normal EF on echo,  I would recommend dose dense Adriamycin and Cytoxan followed by paclitaxel.  -Repeat breast MRI in 2 weeks -She has a mild elevated liver enzymes and bilirubin today, asymptomatic, likely related to chemotherapy. I'll postpone her chemotherapy from tomorrow to in mid next week. -I discussed the clinical trial [facility] [id number], which minimize patient to assess her lymph node dissection versus radiation after neoadjuvant chemotherapy. She is interested. She will further discuss with Dr. Brantley Stage -I sent a message to Dr. Brantley Stage, who agrees to see her in a few weeks to schedule her surgery  2. Genetics  --Given her young  age and triple negative disease, positive family history, she was referred to see genetic counselor for genetic testing, her genetic testing was negative.  3.Anxiety and coping -she will take her xanax needed -I encouraged her to think positive -She has good support from her parents and friend.   4. Nausea, vomiting, anorexia -secondary to chemotherapy, better with taxol now  -She knows to take Compazine and Zofran as needed -I encouraged her to eat small female but for more frequent, and take nutrition supplement  5. Newly diagnosed type 2 diabetes -Continue Lantus and insulin sliding scale, follow-up with her primary care physician. -She is on low-dose dexamethasone 5 mg before Taxol, steroids induced hyperglycemia discussed. -Due to her hyperglycemia (secondary to incidental dexamethasone last night), I'll give her 500 mL normal saline and extra dose insulin at infusion today.   6. Cytopenia secondary to chemo -She received a blood transfusion after the third cycle AC and 5th cycle Taxol  -no lab evidence of hemolysis, or nutritional anemia also.  5. Mild transaminitis and elevated bilirubin -Likely related to chemotherapy -Postpone her chemotherapy from tomorrow to next Wednesday. Repeat lab before next chemotherapy  Plan -Postpone her chemotherapy upfront tomorrow to July 27, repeat lab before chemotherapy -Breast MRI -I'll see her back in 3 weeks  All questions were answered. The patient knows to call the clinic with any problems, questions or concerns. I spent 20 minutes counseling the patient face to face. The total time spent in the appointment was 25 minutes and more than 50% was on counseling.     Truitt Merle, MD 03/12/2015 2:46 PM

## 2015-03-13 ENCOUNTER — Telehealth: Payer: Self-pay | Admitting: *Deleted

## 2015-03-13 ENCOUNTER — Other Ambulatory Visit: Payer: 59

## 2015-03-13 ENCOUNTER — Ambulatory Visit: Payer: 59

## 2015-03-13 ENCOUNTER — Telehealth: Payer: Self-pay | Admitting: Hematology

## 2015-03-13 ENCOUNTER — Ambulatory Visit: Payer: 59 | Admitting: Physician Assistant

## 2015-03-13 NOTE — Telephone Encounter (Signed)
Per staff message and POF I have scheduled appts. Advised scheduler of appts and to move labs. JMW  

## 2015-03-13 NOTE — Telephone Encounter (Signed)
per pof to sch pt appt-sent MW email to sch tp appt trmt-will call pt after reply-sent Dr Burr Medico staff message to put MRI order in-will call pt to sch after reply

## 2015-03-13 NOTE — Addendum Note (Signed)
Addended by: Truitt Merle on: 03/13/2015 12:44 PM   Modules accepted: Orders

## 2015-03-16 ENCOUNTER — Telehealth: Payer: Self-pay | Admitting: Hematology

## 2015-03-16 NOTE — Telephone Encounter (Signed)
talkwed w/pt and adv pt to call [facility] to make appt for MRI of breast-adv to do screening of questions before sch is required gave # [phone]-pt understood

## 2015-03-16 NOTE — Telephone Encounter (Signed)
cld & spoke to pt and adv of taxol appt on 7/27-gave pt time & date

## 2015-03-18 ENCOUNTER — Other Ambulatory Visit (HOSPITAL_BASED_OUTPATIENT_CLINIC_OR_DEPARTMENT_OTHER): Payer: 59

## 2015-03-18 ENCOUNTER — Other Ambulatory Visit: Payer: 59

## 2015-03-18 ENCOUNTER — Ambulatory Visit (HOSPITAL_BASED_OUTPATIENT_CLINIC_OR_DEPARTMENT_OTHER): Payer: 59

## 2015-03-18 VITALS — BP 150/85 | HR 74 | Temp 98.8°F | Resp 16

## 2015-03-18 DIAGNOSIS — Z5111 Encounter for antineoplastic chemotherapy: Secondary | ICD-10-CM

## 2015-03-18 DIAGNOSIS — C773 Secondary and unspecified malignant neoplasm of axilla and upper limb lymph nodes: Secondary | ICD-10-CM | POA: Diagnosis not present

## 2015-03-18 DIAGNOSIS — C50212 Malignant neoplasm of upper-inner quadrant of left female breast: Secondary | ICD-10-CM

## 2015-03-18 LAB — COMPREHENSIVE METABOLIC PANEL (CC13)
ALT: 33 U/L (ref 0–55)
ANION GAP: 9 meq/L (ref 3–11)
AST: 35 U/L — ABNORMAL HIGH (ref 5–34)
Albumin: 3.4 g/dL — ABNORMAL LOW (ref 3.5–5.0)
Alkaline Phosphatase: 99 U/L (ref 40–150)
BILIRUBIN TOTAL: 0.95 mg/dL (ref 0.20–1.20)
BUN: 11.5 mg/dL (ref 7.0–26.0)
CALCIUM: 9.3 mg/dL (ref 8.4–10.4)
CHLORIDE: 105 meq/L (ref 98–109)
CO2: 24 meq/L (ref 22–29)
Creatinine: 1.1 mg/dL (ref 0.6–1.1)
EGFR: 58 mL/min/{1.73_m2} — AB (ref >90)
GLUCOSE: 152 mg/dL — AB (ref 70–140)
Potassium: 4.3 mEq/L (ref 3.5–5.1)
Sodium: 138 mEq/L (ref 136–145)
Total Protein: 6.7 g/dL (ref 6.4–8.3)

## 2015-03-18 LAB — CBC WITH DIFFERENTIAL/PLATELET
BASO%: 0.7 % (ref 0.0–2.0)
Basophils Absolute: 0.1 10*3/uL (ref 0.0–0.1)
EOS%: 1.4 % (ref 0.0–7.0)
Eosinophils Absolute: 0.1 10*3/uL (ref 0.0–0.5)
HEMATOCRIT: 29.7 % — AB (ref 34.8–46.6)
HGB: 9.8 g/dL — ABNORMAL LOW (ref 11.6–15.9)
LYMPH%: 7.6 % — AB (ref 14.0–49.7)
MCH: 35.2 pg — ABNORMAL HIGH (ref 25.1–34.0)
MCHC: 33.1 g/dL (ref 31.5–36.0)
MCV: 106.5 fL — ABNORMAL HIGH (ref 79.5–101.0)
MONO#: 0.7 10*3/uL (ref 0.1–0.9)
MONO%: 9.2 % (ref 0.0–14.0)
NEUT#: 5.9 10*3/uL (ref 1.5–6.5)
NEUT%: 81.1 % — ABNORMAL HIGH (ref 38.4–76.8)
Platelets: 211 10*3/uL (ref 145–400)
RBC: 2.79 10*6/uL — AB (ref 3.70–5.45)
RDW: 24.4 % — ABNORMAL HIGH (ref 11.2–14.5)
WBC: 7.3 10*3/uL (ref 3.9–10.3)
lymph#: 0.6 10*3/uL — ABNORMAL LOW (ref 0.9–3.3)

## 2015-03-18 MED ORDER — FAMOTIDINE IN NACL 20-0.9 MG/50ML-% IV SOLN
INTRAVENOUS | Status: AC
  Filled 2015-03-18: qty 50

## 2015-03-18 MED ORDER — SODIUM CHLORIDE 0.9 % IV SOLN
Freq: Once | INTRAVENOUS | Status: AC
  Administered 2015-03-18: 13:00:00 via INTRAVENOUS

## 2015-03-18 MED ORDER — SODIUM CHLORIDE 0.9 % IJ SOLN
10.0000 mL | INTRAMUSCULAR | Status: DC | PRN
  Administered 2015-03-18: 10 mL
  Filled 2015-03-18: qty 10

## 2015-03-18 MED ORDER — SODIUM CHLORIDE 0.9 % IV SOLN
80.0000 mg/m2 | Freq: Once | INTRAVENOUS | Status: AC
  Administered 2015-03-18: 174 mg via INTRAVENOUS
  Filled 2015-03-18: qty 29

## 2015-03-18 MED ORDER — DIPHENHYDRAMINE HCL 50 MG/ML IJ SOLN
INTRAMUSCULAR | Status: AC
  Filled 2015-03-18: qty 1

## 2015-03-18 MED ORDER — PACLITAXEL CHEMO INJECTION 300 MG/50ML: 80.0000 mg/m2 | Freq: Once | INTRAVENOUS | Status: DC

## 2015-03-18 MED ORDER — DIPHENHYDRAMINE HCL 50 MG/ML IJ SOLN
50.0000 mg | Freq: Once | INTRAMUSCULAR | Status: AC
  Administered 2015-03-18: 50 mg via INTRAVENOUS

## 2015-03-18 MED ORDER — FAMOTIDINE IN NACL 20-0.9 MG/50ML-% IV SOLN
20.0000 mg | Freq: Once | INTRAVENOUS | Status: AC
  Administered 2015-03-18: 20 mg via INTRAVENOUS

## 2015-03-18 MED ORDER — HEPARIN SOD (PORK) LOCK FLUSH 100 UNIT/ML IV SOLN
500.0000 [IU] | Freq: Once | INTRAVENOUS | Status: AC | PRN
  Administered 2015-03-18: 500 [IU]
  Filled 2015-03-18: qty 5

## 2015-03-18 MED ORDER — DEXAMETHASONE SODIUM PHOSPHATE 100 MG/10ML IJ SOLN
Freq: Once | INTRAMUSCULAR | Status: AC
  Administered 2015-03-18: 13:00:00 via INTRAVENOUS
  Filled 2015-03-18: qty 4

## 2015-03-18 NOTE — Patient Instructions (Signed)
[facility] Discharge Instructions for Patients Receiving Chemotherapy  Today you received the following chemotherapy agent: Taxol   To help prevent nausea and vomiting after your treatment, we encourage you to take your nausea medication as prescribed.    If you develop nausea and vomiting that is not controlled by your nausea medication, call the clinic.   BELOW ARE SYMPTOMS THAT SHOULD BE REPORTED IMMEDIATELY:  *FEVER GREATER THAN 100.5 F  *CHILLS WITH OR WITHOUT FEVER  NAUSEA AND VOMITING THAT IS NOT CONTROLLED WITH YOUR NAUSEA MEDICATION  *UNUSUAL SHORTNESS OF BREATH  *UNUSUAL BRUISING OR BLEEDING  TENDERNESS IN MOUTH AND THROAT WITH OR WITHOUT PRESENCE OF ULCERS  *URINARY PROBLEMS  *BOWEL PROBLEMS  UNUSUAL RASH Items with * indicate a potential emergency and should be followed up as soon as possible.  Feel free to call the clinic you have any questions or concerns. The clinic phone number is [phone].  Please show the CHEMO ALERT CARD at check-in to the Emergency Department and triage nurse.   

## 2015-03-27 ENCOUNTER — Other Ambulatory Visit: Payer: Self-pay | Admitting: *Deleted

## 2015-04-01 ENCOUNTER — Ambulatory Visit
Admission: RE | Admit: 2015-04-01 | Discharge: 2015-04-01 | Disposition: A | Discharge status: Home / Self Care | Payer: 59 | Source: Ambulatory Visit | Attending: Hematology | Admitting: Hematology

## 2015-04-01 DIAGNOSIS — C50212 Malignant neoplasm of upper-inner quadrant of left female breast: Secondary | ICD-10-CM

## 2015-04-01 MED ORDER — GADOBENATE DIMEGLUMINE 529 MG/ML IV SOLN
19.0000 mL | Freq: Once | INTRAVENOUS | Status: AC | PRN
  Administered 2015-04-01: 19 mL via INTRAVENOUS

## 2015-04-01 MED ORDER — GADOBENATE DIMEGLUMINE 529 MG/ML IV SOLN: 20.0000 mL | Freq: Once | INTRAVENOUS | Status: DC | PRN

## 2015-04-03 ENCOUNTER — Other Ambulatory Visit (HOSPITAL_BASED_OUTPATIENT_CLINIC_OR_DEPARTMENT_OTHER): Payer: 59

## 2015-04-03 ENCOUNTER — Ambulatory Visit (HOSPITAL_BASED_OUTPATIENT_CLINIC_OR_DEPARTMENT_OTHER): Payer: 59 | Admitting: Hematology

## 2015-04-03 ENCOUNTER — Encounter: Payer: Self-pay | Admitting: Hematology

## 2015-04-03 ENCOUNTER — Encounter: Payer: Self-pay | Admitting: *Deleted

## 2015-04-03 ENCOUNTER — Other Ambulatory Visit: Payer: 59

## 2015-04-03 ENCOUNTER — Encounter: Payer: 59 | Admitting: *Deleted

## 2015-04-03 VITALS — BP 182/87 | HR 95 | Temp 98.4°F | Resp 18 | Ht 65.0 in | Wt 233.8 lb

## 2015-04-03 DIAGNOSIS — C50212 Malignant neoplasm of upper-inner quadrant of left female breast: Secondary | ICD-10-CM

## 2015-04-03 LAB — CBC WITH DIFFERENTIAL/PLATELET
BASO%: 1.3 % (ref 0.0–2.0)
BASOS ABS: 0.1 10*3/uL (ref 0.0–0.1)
EOS%: 2.6 % (ref 0.0–7.0)
Eosinophils Absolute: 0.1 10*3/uL (ref 0.0–0.5)
HEMATOCRIT: 34.5 % — AB (ref 34.8–46.6)
HGB: 11.6 g/dL (ref 11.6–15.9)
LYMPH%: 14.3 % (ref 14.0–49.7)
MCH: 36.1 pg — AB (ref 25.1–34.0)
MCHC: 33.5 g/dL (ref 31.5–36.0)
MCV: 107.8 fL — AB (ref 79.5–101.0)
MONO#: 0.4 10*3/uL (ref 0.1–0.9)
MONO%: 9.1 % (ref 0.0–14.0)
NEUT#: 3.3 10*3/uL (ref 1.5–6.5)
NEUT%: 72.7 % (ref 38.4–76.8)
PLATELETS: 197 10*3/uL (ref 145–400)
RBC: 3.2 10*6/uL — ABNORMAL LOW (ref 3.70–5.45)
RDW: 21 % — ABNORMAL HIGH (ref 11.2–14.5)
WBC: 4.5 10*3/uL (ref 3.9–10.3)
lymph#: 0.7 10*3/uL — ABNORMAL LOW (ref 0.9–3.3)

## 2015-04-03 LAB — COMPREHENSIVE METABOLIC PANEL (CC13)
ALT: 20 U/L (ref 0–55)
ANION GAP: 10 meq/L (ref 3–11)
AST: 20 U/L (ref 5–34)
Albumin: 3.5 g/dL (ref 3.5–5.0)
Alkaline Phosphatase: 101 U/L (ref 40–150)
BUN: 11.1 mg/dL (ref 7.0–26.0)
CO2: 20 meq/L — AB (ref 22–29)
CREATININE: 0.8 mg/dL (ref 0.6–1.1)
Calcium: 9.1 mg/dL (ref 8.4–10.4)
Chloride: 108 mEq/L (ref 98–109)
EGFR: 82 mL/min/{1.73_m2} — ABNORMAL LOW (ref >90)
Glucose: 176 mg/dl — ABNORMAL HIGH (ref 70–140)
Potassium: 4.6 mEq/L (ref 3.5–5.1)
Sodium: 138 mEq/L (ref 136–145)
TOTAL PROTEIN: 6.6 g/dL (ref 6.4–8.3)
Total Bilirubin: 0.46 mg/dL (ref 0.20–1.20)

## 2015-04-03 NOTE — Progress Notes (Signed)
1 week [facility]  Telephone:([phone] Fax:([phone]) [place] Note   Patient Care Team: Seward Carol, MD as PCP - General (Internal Medicine) Erroll Luna, MD as Consulting Physician (General Surgery) Truitt Merle, MD as Consulting Physician (Hematology) Thea Silversmith, MD as Consulting Physician (Radiation Oncology) Rockwell Germany, RN as Registered Nurse Mauro Kaufmann, RN as Registered Nurse 04/03/2015  CHIEF COMPLAINTS  Follow up breast cancer   Oncology History   Breast cancer of upper-inner quadrant of left female breast   Staging form: Breast, AJCC 7th Edition     Clinical stage from 10/08/2014: Stage IIB (T2, N1, M0) - Unsigned     Pathologic: Stage IIB (T2, N1, cM0) - Unsigned       Breast cancer of upper-inner quadrant of left female breast   09/26/2014 Breast US 2.5 cm irregular mass in the upper inner left breast with adjacent 1.1 cm satellite mass and enlarged left axillary lymph nodes, highly suspicious for left breast malignancy and left axillary lymph node metastases. Tissue sampling is recommended.    09/26/2014 Mammogram Spot compression views of the left breast and routine views of both breasts demonstrate a 2 x 2.5 cm irregular mass in the upper inner left breast.    09/29/2014 Initial Biopsy 1. Breast, left, needle core biopsy, mass, 11 o'clock - INVASIVE MAMMARY CARCINOMA, SEE COMMENT. - MAMMARY CARCINOMA IN SITU. 2. Lymph node, needle/core biopsy, left axillary - ONE LYMPH NODE, POSITIVE FOR METASTATIC MAMMARY CARCINOMA (1/1).   09/29/2014 Receptors her2 Estrogen Receptor: 0%, NEGATIVE Progesterone Receptor: 0%, NEGATIVE, HER2 negative,  Proliferation Marker Ki67: 93%   10/01/2014 Initial Diagnosis Breast cancer of upper-inner quadrant of left female breast   10/17/2014 Imaging CT and bone scan negative for distant metastasis    10/24/2014 - 03/18/2015 Adjuvant Chemotherapy ddACx4 then weekly Taxol X12   11/14/2014 Miscellaneous Genetic  testing BreastNext @ [facility] was negative except 2 variants with unknown significance: ATM p.[id number] and RAD51C p.I52L   04/01/2015 Imaging breast MRI showed complete resolution of the previously described left breast mass and left axillary adenopathy. No other new lesions.     HISTORY OF PRESENTING ILLNESS:  Ashlee Hill 54 y.o. female presents to our multidisciplinary breast clinic today to discuss the management of her newly diagnosed breast cancer  She noticed a left breast lump in August 2015, no tenderness, skin or nipple change. She otherwise felt well. She did not seek immediate medical attention due to lack of insurance. She finally got her insurance approved and saw her primary care physician recently. She was referred for mammogram which showed a 2.5 cm irregular mass in the upper inner left breast. She underwent left breast mass and axillary node biopsy on 09/29/2014 and both biopsy showed invasive ductal carcinoma, ER negative, PR negative, HER-2 negative.  She otherwise feels well, no pain or ther complains. She has good appetite and her weight is stable.  CURRENT THERAPY: Neoadjuvant chemotherapy with ddAC every 2 weeks x4, followed by weekly Taxol 12, started on 10/24/2058, Taxol started on 12/26/14  INTERIM HISTORY; Ashlee Hill returns for follow-up. She is accompanied by her parents to discuss her breast MRI result. She completed neoadjuvant chemotherapy 2 weeks ago. She feels better overall, nausea has resolved, her appetite is much better, she is eating well. She denies any pain, mild fatigue is stable, no dyspnea, no other new symptoms.  MEDICAL HISTORY:  Past Medical History  Diagnosis Date  . Breast cancer of upper-inner quadrant of  left female breast 10/01/2014  . Hypertension   . Family history of breast cancer   . Wears glasses     driving  . Diabetes mellitus without complication 02/09/29    chemo caused diabetes per pt     SURGICAL HISTORY: Past Surgical History    Procedure Laterality Date  . Tonsillectomy    . Wisdom tooth extraction    . Portacath placement Right 10/21/2014    Procedure: INSERTION PORT-A-CATH;  Surgeon: Erroll Luna, MD;  Location: [place];  Service: General;  Laterality: Right;     GYN HISTORY  Menarchal: 12 LMP: 09/27/2014 Contraceptive: no HRT: no  G0P0    SOCIAL HISTORY: Social History   Social History  . Marital Status: Single    Spouse Name: N/A  . Number of Children: N/A  . Years of Education: N/A   Occupational History  . Not on file.   Social History Main Topics  . Smoking status: Never Smoker   . Smokeless tobacco: Never Used  . Alcohol Use: 0.6 oz/week    1 Glasses of wine per week     Comment: socail drinker  . Drug Use: No  . Sexual Activity: Not on file   Other Topics Concern  . Not on file   Social History Narrative    FAMILY HISTORY: Family History  Problem Relation Age of Onset  . Prostate cancer Father 53    currently 12  . Breast cancer Paternal Aunt 22    deceased 61  . Lung cancer Maternal Grandfather 71    smoker; deceased  . Thyroid cancer Cousin 37    pat first cousin; currently 31     Father had prostate cancer at age of 70 Paternal aunt had breast caner in her 33's Paternal cousin had thyroid cancer at age of before 16 Maternal grandfather had lung cancer    ALLERGIES:  is allergic to tegaderm ag mesh.  MEDICATIONS:  Current Outpatient Prescriptions  Medication Sig Dispense Refill  . ALPRAZolam (XANAX) 1 MG tablet Take 1-2 mg by mouth 2 (two) times daily. Take 1 tablet (1 mg) in the morning & Take 2 tablets (2 mg) in the evening.    . Alum & Mag Hydroxide-Simeth (MAGIC MOUTHWASH W/LIDOCAINE) SOLN Take 5 mLs by mouth 4 (four) times daily as needed for mouth pain. 120 mL 1  . atorvastatin (LIPITOR) 10 MG tablet Take 10 mg by mouth daily.    . B-D UF III MINI PEN NEEDLES 31G X 5 MM MISC   0  . calcium carbonate (OS-CAL) 600 MG TABS tablet Take 600  mg by mouth daily with breakfast.    . gabapentin (NEURONTIN) 600 MG tablet Take 600 mg by mouth at bedtime. As needed for sleep or anxiety    . hydrochlorothiazide (HYDRODIURIL) 25 MG tablet Take 25 mg by mouth daily.     . insulin aspart (NOVOLOG) 100 UNIT/ML injection Inject into the skin 3 (three) times daily before meals. Just started-sliding scale per Dr Delfina Redwood    . LANTUS SOLOSTAR 100 UNIT/ML Solostar Pen Inject 30 Units into the skin at bedtime.   0  . menthol-cetylpyridinium (CEPACOL) 3 MG lozenge Take 1 lozenge by mouth every 2 (two) hours as needed for sore throat (sore throat).    . metoprolol (LOPRESSOR) 100 MG tablet Take 100 mg by mouth daily.    . ONE TOUCH ULTRA TEST test strip TEST BLOOD SUGAR AS DIRECTED THREE TIMES A DAY  0  .  ONETOUCH DELICA LANCETS 83M MISC use to [employer] FINGER three times a day as directed  0  . venlafaxine XR (EFFEXOR-XR) 150 MG 24 hr capsule Take 150 mg by mouth daily with breakfast.    . zolpidem (AMBIEN) 10 MG tablet Take 10 mg by mouth at bedtime as needed for sleep.    Marland Kitchen aspirin 81 MG tablet Take 81 mg by mouth daily.     No current facility-administered medications for this visit.    REVIEW OF SYSTEMS:   Constitutional: Denies fevers, chills or abnormal night sweats Eyes: Denies blurriness of vision, double vision or watery eyes Ears, nose, mouth, throat, and face: resolving mucositis and sore throat Respiratory: Denies cough, dyspnea or wheezes Cardiovascular: Denies palpitation, chest discomfort or lower extremity swelling Gastrointestinal:  Denies nausea, heartburn or change in bowel habits Skin: Denies abnormal skin rashes Lymphatics: Denies new lymphadenopathy or easy bruising Neurological:Denies numbness, tingling or new weaknesses Behavioral/Psych: Mood is stable, no new changes  All other systems were reviewed with the patient and are negative.  PHYSICAL EXAMINATION: ECOG PERFORMANCE STATUS: 1  Filed Vitals:   04/03/15 0959  BP:  182/87  Pulse: 95  Temp: 98.4 F (36.9 C)  Resp: 18   Filed Weights   04/03/15 0959  Weight: 233 lb 12.8 oz (106.051 kg)    GENERAL:alert, no distress and comfortable SKIN: skin color, texture, turgor are normal, no rashes or significant lesions. Port site looks clean, (+) skin peeling on the bottom of left feet EYES: normal, conjunctiva are pink and non-injected, sclera clear OROPHARYNX:no exudate, no erythema and lips, buccal mucosa, and tongue normal  NECK: supple, thyroid normal size, non-tender, without nodularity LYMPH:  no palpable lymphadenopathy in the cervical, axillary or inguinal LUNGS: clear to auscultation and percussion with normal breathing effort HEART: regular rate & rhythm and no murmurs and no lower extremity edema ABDOMEN:abdomen soft, non-tender and normal bowel sounds Musculoskeletal:no cyanosis of digits and no clubbing  PSYCH: alert & oriented x 3 with fluent speech NEURO: no focal motor/sensory deficits Breasts: Breast inspection showed them to be symmetrical with no nipple discharge. Palpation of the left breasts showed a 1cm residual small mass in the upper mid quadrant (much smaller than before), no palpable axillary lymph node. Exam of the right breast and axillary was normal.    LABORATORY DATA:  I have reviewed the data as listed CBC Latest Ref Rng 04/03/2015 03/18/2015 03/12/2015  WBC 3.9 - 10.3 10e3/uL 4.5 7.3 4.2  Hemoglobin 11.6 - 15.9 g/dL 11.6 9.8(L) 9.2(L)  Hematocrit 34.8 - 46.6 % 34.5(L) 29.7(L) 26.7(L)  Platelets 145 - 400 10e3/uL 197 211 223     Recent Labs  12/04/14 1134 12/04/14 1820 12/05/14 0520 12/06/14 0530  03/12/15 1404 03/18/15 1127 04/03/15 0918  NA 129*  --  138 138  < > 136 138 138  K 3.6  --  3.3* 4.1  < > 3.8 4.3 4.6  CL 91*  --  103 106  --   --   --   --   CO2 25  --  27 24  < > 21* 24 20*  GLUCOSE 422*  --  227* 204*  < > 165* 152* 176*  BUN 10  --  7 <5*  < > 11.3 11.5 11.1  CREATININE 1.27*  --  1.03 0.79  <  > 0.9 1.1 0.8  CALCIUM 8.8  --  8.1* 7.7*  < > 9.8 9.3 9.1  GFRNONAA 48*  --  61* >  90  --   --   --   --   GFRAA 55*  --  71* >90  --   --   --   --   PROT 7.5  --  5.9*  --   < > 7.0 6.7 6.6  ALBUMIN 4.5  --  3.3*  --   < > 3.8 3.4* 3.5  AST 36  --  28  --   < > 94* 35* 20  ALT 28  --  22  --   < > 61* 33 20  ALKPHOS 92  --  62  --   < > 83 99 101  BILITOT 1.3* 0.9 0.8  --   < > 1.43* 0.95 0.46  BILIDIR  --  0.2  --   --   --   --   --   --   IBILI  --  0.7  --   --   --   --   --   --   < > = values in this interval not displayed.  PATHOLOGY REPORT 09/26/2014 Diagnosis 1. Breast, left, needle core biopsy, mass, 11 o'clock - INVASIVE MAMMARY CARCINOMA, SEE COMMENT. - MAMMARY CARCINOMA IN SITU. 2. Lymph node, needle/core biopsy, left axillary - ONE LYMPH NODE, POSITIVE FOR METASTATIC MAMMARY CARCINOMA (1/1). Microscopic Comment 1. Although grade of tumor is best assessed at resection, with these biopsies, both the in situ and invasive carcinoma are grade II. The invasive carcinoma demonstrates strong diffuse E-cadherin expression; supporting a ductal phenotype. 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  Estrogen Receptor: 0%, NEGATIVE Progesterone Receptor: 0%, NEGATIVE Proliferation Marker Ki67: 93% 1. A sample was sent to [facility] for HER-2 testing by FISH. The results are as follows: Negative.  RADIOGRAPHIC STUDIES: I have personally reviewed the radiological images as listed and agreed with the findings in the report.  Mr Breast Bilateral W Wo Contrast 10/08/2014    IMPRESSION: 1. Biopsy proven malignancy in the upper inner left breast with adjacent/contiguous areas of nodularity measures up to 3.5 cm, with multiple morphologically abnormal axillary lymph nodes compatible with known axillary metastases.  2. No MRI evidence of malignancy in  the right breast.  RECOMMENDATION: Treatment plan for known left breast malignancy.  BI-RADS CATEGORY  6: Known biopsy-proven malignancy.   Electronically Signed   By: Everlean Alstrom M.D.   On: 10/08/2014 10:29   US Breast Ltd Uni Left Inc Axilla 09/26/2014    IMPRESSION: 2.5 cm irregular mass in the upper inner left breast with adjacent 1.1 cm satellite mass and enlarged left axillary lymph nodes, highly suspicious for left breast malignancy and left axillary lymph node metastases. Tissue sampling is recommended.  No mammographic evidence of right breast malignancy.    CT chest, abdomen and pelvis with contrast 10/17/2014 IMPRESSION: 1. Left breast lesion is identified compatible with the clinical history of breast cancer. 2. Enlarged left axillary lymph nodes suspicious for metastatic adenopathy. 3. No specific features identified to suggest distant metastatic disease. 4. Hepatic steatosis.  Bone scan 10/17/2014 IMPRESSION: 1. The uptake pattern within the skeleton is not highly suspicious for malignancy. However, subtle increased uptake in the midshaft of the right humerus and the lower lumbar spine posteriorly merits further evaluation with plain films. 2. The uptake over the lower extremities is consistent with degenerative change.  Lumbar spine X-ray 10/20/14 IMPRESSION: No lytic or sclerotic  osseous lesion.  Probable degenerative facet osteoarthritic change at L4-L5 and L5-S1 which may correspond to the area of abnormal uptake at recent bone scan.  RIGHT HUMERUS X-RAY 10/20/2014 IMPRESSION: No abnormality seen to correspond to abnormal uptake seen on bone scan  Breast MRI 04/01/2015 IMPRESSION: Previously described mass in upper-inner quadrant of the left breast and left axillary adenopathy are no longer evident with MR imaging consistent with response to neoadjuvant chemotherapy.  ASSESSMENT & PLAN:  54 year old pre-menopausal female with past medical history of  hypertension, presented with a palpable left breast mass.  1. Left breast invasive ductal carcinoma, cT2N1M0, stage IIB, triple negative  -I discussed her all imaging finding and biopsy results extensively with patient. Her ultrasound and MRI breast reviewed multiple left axillary lymph nodes, at least N1 disease, possible N2. I discussed with her and she has locally advanced disease, and triple-negative breast cancer tends to be more aggressive with early metastasis and cancer recurrence after surgery. -a staging CT showed no evidence of distant metastasis, the subtle increased uptake in the right humerus and lower lumbar spine on the bone scan was further evaluated by x-ray, which were negative. - I recommend neoadjuvant chemotherapy with dose dense before meals followed by weekly Taxol, given her very high risk of cancer recurrence after surgery alone. -she had moderate side effects from chemotherapy, and required blood transfusion. She is recovering well -I reviewed her repeated breast MRI results, she has complete response on image. Clinically, she still has a small residual lump, no palpable lymphadenopathy. -Due to her insurance issue, Dr. Brantley Stage is not able to operate on her, she has been referred to [place] to see Dr. Mable Fill Howard-McNatt next week.  -If her final surgical pathology reveals significant residual disease, especially positive note, I may consider adjuvant capecitabine. Giving the complete response on the MRI, I anticipate she has had good response. -Okay to take the port out during the breast surgery -I'll see her after her surgery to review the pathology findings.  2. Genetics  --Given her young age and triple negative disease, positive family history, she was referred to see genetic counselor for genetic testing, her genetic testing was negative.  3.Anxiety and coping -she will take her xanax needed -I encouraged her to think positive -She has good support  from her parents and friend.   4. Newly diagnosed type 2 diabetes -Continue Lantus and insulin sliding scale, follow-up with her primary care physician. -better lately after off chemo   6. Cytopenia secondary to chemo -Significantly improved, hemoglobin 11.6 today  5. Mild transaminitis and elevated bilirubin -secondary to chemo -resolved now   Plan -she will see Dr. Mable Fill Howard-McNatt next week.  -She will call us after her breast surgery, I plan to see her back 2-3 weeks after her surgery. -She will follow-up with Dr. Corlis Hove was for adjuvant radiation after surgery  All questions were answered. The patient knows to call the clinic with any problems, questions or concerns. I spent 20 minutes counseling the patient face to face. The total time spent in the appointment was 25 minutes and more than 50% was on counseling.     Truitt Merle, MD 04/03/2015 10:40 AM

## 2015-04-08 ENCOUNTER — Encounter: Payer: Self-pay | Admitting: General Practice

## 2015-04-08 NOTE — Progress Notes (Signed)
Spiritual Care Note  Received phone call from Geeta to share her joy about her MRI results and to thank me for my support and prayers.  Provided pastoral presence, reflective listening, and emphatic celebration.  We plan to continue Spiritual Care conversations and prayer post surgery when she returns to [facility] for radiation.  Please also page as needs arise.  Thank you.  [place], [place] Pager [phone] Voicemail  [phone]

## 2015-04-09 ENCOUNTER — Telehealth: Payer: Self-pay | Admitting: *Deleted

## 2015-04-09 ENCOUNTER — Encounter: Payer: Self-pay | Admitting: *Deleted

## 2015-04-09 NOTE — Progress Notes (Signed)
Her surgery is scheduled for 04/30/15 with Dr. Alvan Dame at [facility].

## 2015-04-09 NOTE — Telephone Encounter (Signed)
Spoke with patient.  She had her consult at [facility] with Dr. Alvan Dame.  She stated it was very overwhelming.  She actually saw a lot of people while she was there yesterday and lasted about 3 hours.  She stated they discussed the lymph node study with her. She just isn't sure what she wants to do at this point about the study or radiation.  Offered to see Dr. Pablo Ledger here before surgery to discuss.  She wants to think about everything and she will call me back when she has made a decision.

## 2015-04-16 ENCOUNTER — Ambulatory Visit
Admission: RE | Admit: 2015-04-16 | Discharge: 2015-04-16 | Disposition: A | Discharge status: Home / Self Care | Payer: 59 | Source: Ambulatory Visit | Attending: Radiation Oncology | Admitting: Radiation Oncology

## 2015-04-16 ENCOUNTER — Ambulatory Visit: Payer: 59

## 2015-04-16 DIAGNOSIS — C50212 Malignant neoplasm of upper-inner quadrant of left female breast: Secondary | ICD-10-CM | POA: Insufficient documentation

## 2015-04-16 DIAGNOSIS — Z510 Encounter for antineoplastic radiation therapy: Secondary | ICD-10-CM | POA: Insufficient documentation

## 2015-04-17 ENCOUNTER — Other Ambulatory Visit: Payer: Self-pay | Admitting: Hematology

## 2015-04-22 NOTE — Progress Notes (Signed)
Location of Breast Cancer:Left upper-inner quadrant.  Histology per Pathology Report:09/26/2014 FINAL DIAGNOSIS Diagnosis 1. Breast, left, needle core biopsy, mass, 11 o'clock - INVASIVE MAMMARY CARCINOMA, SEE COMMENT. - MAMMARY CARCINOMA IN SITU. 2. Lymph node, needle/core biopsy, left axillary - ONE LYMPH NODE, POSITIVE FOR METASTATIC MAMMARY CARCINOMA (1/1).  Receptor Status: ER(-), PR (-), Her2-neu (-) triple negative  Did patient present with symptoms (if so, please note symptoms) or was this found on screening mammography?:Palpated by patient but immediate attention not sought secondary to no insurance.Once insurance obtained patient was seen by PCP whom sent for mammogram.   Past/Anticipated interventions by surgeon, if PQA:ESLPNPY scheduled for April 30, 2015 at [facility]  Past/Anticipated interventions by medical oncology, if any: Chemotherapy :CURRENT THERAPY: Neoadjuvant chemotherapy with ddAC every 2 weeks x4, followed by weekly Taxol 12, started on 10/24/2058, Taxol started on 12/26/14  Lymphedema issues, if any: No  Pain issues, if any:No  SAFETY ISSUES:  Prior radiation? No  Pacemaker/ICD?No   Possible current pregnancy?No. Last menstrual cycle October 23, 2014.  Is the patient on methotrexate? No  Current Complaints / other details: Menarche age 45.G0 P0.  Paternal Aunt diagnosed with breast cancer in her 2's   Allergies:tegaderm mesh    Arlyss Repress, RN 04/22/2015,10:10 AM

## 2015-04-23 ENCOUNTER — Ambulatory Visit
Admission: RE | Admit: 2015-04-23 | Discharge: 2015-04-23 | Disposition: A | Discharge status: Home / Self Care | Payer: 59 | Source: Ambulatory Visit | Attending: Radiation Oncology | Admitting: Radiation Oncology

## 2015-04-23 ENCOUNTER — Encounter: Payer: Self-pay | Admitting: Radiation Oncology

## 2015-04-23 VITALS — BP 146/94 | HR 102 | Temp 97.8°F | Wt 226.1 lb

## 2015-04-23 DIAGNOSIS — C50212 Malignant neoplasm of upper-inner quadrant of left female breast: Secondary | ICD-10-CM

## 2015-04-23 NOTE — Progress Notes (Signed)
Radiation Oncology         [phone]) [phone] ________________________________  Initial outpatient Consultation - Date: 04/23/2015   Name: Ashlee Hill MRN: [id number]   DOB: 1961/08/07  REFERRING PHYSICIAN: Erroll Luna, MD  DIAGNOSIS AND STAGE: Breast cancer of upper-inner quadrant of left female breast   Staging form: Breast, AJCC 7th Edition     Clinical stage from 10/08/2014: Stage IIB (T2, N1, M0) - Unsigned     Pathologic: Stage IIB (T2, N1, cM0) - Unsigned  HISTORY OF PRESENT ILLNESS::Ashlee Hill is a 54 y.o. female presents for f/u today after completing chemotherapy. Due to insurance issues, she was not able to undergo surgery under Dr. Brantley Stage. She was then referred to [facility] where her surgery is scheduled on September 8. She requested to see me before surgery for the need of adjuvant radiation. Recent MRI shows complete resolution of her disease. In particular, her left axillary lymph nodes have completely resolved on her MRI scan. She is accompanied by her mother and father.   PREVIOUS RADIATION THERAPY: No  Past medical, social and family history were reviewed in the electronic chart. Review of symptoms was reviewed in the electronic chart. Medications were reviewed in the electronic chart.   PHYSICAL EXAM:  Filed Vitals:   04/23/15 0846  BP: 146/94  Pulse: 102  Temp: 97.8 F (36.6 C)  .226 lb 1.6 oz (102.558 kg). Alert and oriented x 3. In no distress.  IMPRESSION: T2N1 Triple Negative Left Breast Cancer  PLAN: I spoke with Ms. Axon that standard practice would be an axillary lymph node dissection in the setting of a positive axillary lymph node initially. Also standard after lumpectomy is radiation to the breast as well as the supraclavicular lymph nodes. This is an area of controversy and she is eligible for a clinical trial investigating the benefit of axillary lymph node dissection on patients who continue to have positive lymph nodes after  chemotherapy. Unfortunately, if she would like to enroll in this trial, she would have to have her radiation performed at [facility]. If she has negative lymph nodes, she could have her radiation performed here and she would be a candidate for [id number]. We discussed the incidence of lymphedema and risk factors.   I spoke to the patient today regarding her diagnosis and options for treatment. We discussed the equivalence in terms of survival and local failure between mastectomy and breast conservation. We discussed the role of radiation in decreasing local failures in patients who undergo lumpectomy. We discussed the process of simulation and the placement tattoos. We discussed 6 weeks of treatment as an outpatient. We discussed the possibility of asymptomatic lung damage. We discussed the use of breath hold technique for cardiac sparing.  We discussed the low likelihood of secondary malignancies. We discussed the possible side effects including but not limited to skin redness, fatigue, permanent skin darkening, and breast swelling. I also recommended that the patient could attend a class on lymphedema, but she is not interested at this time.  I spent 30 minutes  face to face with the patient and more than 50% of that time was spent in counseling and/or coordination of care.   This document serves as a record of services personally performed by Thea Silversmith, MD. It was created on her behalf by Darcus Austin, a trained medical scribe. The creation of this record is based on the scribe's personal observations and the provider's statements to them. This document has been checked and approved  by the attending provider.   ------------------------------------------------  Thea Silversmith, MD

## 2015-04-23 NOTE — Progress Notes (Signed)
Please see the Nurse Progress Note in the MD Initial Consult Encounter for this patient. 

## 2015-04-23 NOTE — Addendum Note (Signed)
Encounter addended by: Norm Salt, RN on: 04/23/2015 11:42 AM<BR>     Documentation filed: Charges VN

## 2015-05-04 ENCOUNTER — Other Ambulatory Visit: Payer: Self-pay | Admitting: *Deleted

## 2015-05-05 ENCOUNTER — Other Ambulatory Visit: Payer: Self-pay | Admitting: *Deleted

## 2015-05-08 ENCOUNTER — Telehealth: Payer: Self-pay | Admitting: *Deleted

## 2015-05-08 ENCOUNTER — Other Ambulatory Visit: Payer: Self-pay | Admitting: *Deleted

## 2015-05-08 ENCOUNTER — Telehealth: Payer: Self-pay | Admitting: Hematology

## 2015-05-08 ENCOUNTER — Encounter: Payer: Self-pay | Admitting: General Practice

## 2015-05-08 NOTE — Telephone Encounter (Signed)
Received call from patient stating she had her surgery at [facility] on 9/8 and it went well. She had her follow up visit with Dr. Elisha Headland today and was told that her lymph nodes were negative and margins clear.  She is so happy.  Informed her I would put orders in for a follow up with Dr. Burr Medico and Dr. Pablo Ledger.   She wants to receive her radiation here.  Encouraged her to call with any needs or concerns.

## 2015-05-08 NOTE — Telephone Encounter (Signed)
s.w. pt and advised on Sept appt pt ok and aware °

## 2015-05-08 NOTE — Progress Notes (Signed)
Spiritual Care Note  Referred for f/u support by Johnnye Lana, LCSW, due to recent distress screen results and my supportive relationship with pt.  Unable to reach [place] by phone, so left voicemail of support and encouragement, reminding her of my ongoing availability.    [place], [place] Pager [phone] Voicemail [phone]

## 2015-05-19 ENCOUNTER — Telehealth: Payer: Self-pay | Admitting: *Deleted

## 2015-05-19 NOTE — Telephone Encounter (Signed)
05/19/2015 Received voice mail message from patient sent Friday 05/15/15, stating that her surgery was completed as planned on 04/30/15 at [facility]. Patient reported negative lymph nodes on pathology report. Of note, she did not enroll in the Alliance [id number] study at [facility]. Returned call to patient today to let her know that she may be a candidate for the NSABP B-51 trial, which can be discussed with her at the time of her follow-up visits this week with Drs. Burr Medico and Quail. Will plan to continue to follow patient, if she is interested in participating. Patient was previously given a copy of the B-51 consent, and was encouraged to read this information prior to her visits on 05/21/15. Cindy S. Brigitte Pulse BSN, RN, [place] 05/19/2015 4:20 PM

## 2015-05-20 NOTE — Progress Notes (Signed)
Location of Breast Cancer:Left upper-inner breast.invasive ductal carcinoma  Histology per Pathology Report:  Receptor Status: ER(), PR (), Her2-neu ()  Did patient present with symptoms (if so, please note symptoms) or was this found on screening mammography?:   Past/Anticipated interventions by surgeon, if any:  Past/Anticipated interventions by medical oncology, if any: Chemotherapy  Lymphedema issues, if any:  Pain issues, if any:   SAFETY ISSUES:  Prior radiation?   Pacemaker/ICD?   Possible current pregnancy?  Is the patient on methotrexate?   Current Complaints / other details:    Arlyss Repress, RN 05/20/2015,4:20 PM

## 2015-05-21 ENCOUNTER — Ambulatory Visit: Admission: RE | Admit: 2015-05-21 | Payer: 59 | Source: Ambulatory Visit

## 2015-05-21 ENCOUNTER — Ambulatory Visit (HOSPITAL_BASED_OUTPATIENT_CLINIC_OR_DEPARTMENT_OTHER): Payer: 59 | Admitting: Hematology

## 2015-05-21 ENCOUNTER — Encounter: Payer: Self-pay | Admitting: *Deleted

## 2015-05-21 ENCOUNTER — Ambulatory Visit (HOSPITAL_BASED_OUTPATIENT_CLINIC_OR_DEPARTMENT_OTHER): Payer: 59

## 2015-05-21 ENCOUNTER — Telehealth: Payer: Self-pay | Admitting: Hematology

## 2015-05-21 ENCOUNTER — Ambulatory Visit
Admission: RE | Admit: 2015-05-21 | Discharge: 2015-05-21 | Disposition: A | Discharge status: Home / Self Care | Payer: 59 | Source: Ambulatory Visit | Attending: Radiation Oncology | Admitting: Radiation Oncology

## 2015-05-21 ENCOUNTER — Encounter: Payer: Self-pay | Admitting: Hematology

## 2015-05-21 VITALS — BP 159/86 | HR 91 | Temp 98.2°F | Resp 18 | Ht 65.0 in | Wt 233.7 lb

## 2015-05-21 VITALS — BP 142/78 | HR 87 | Temp 98.2°F | Resp 18

## 2015-05-21 DIAGNOSIS — C50212 Malignant neoplasm of upper-inner quadrant of left female breast: Secondary | ICD-10-CM | POA: Diagnosis not present

## 2015-05-21 MED ORDER — HEPARIN SOD (PORK) LOCK FLUSH 100 UNIT/ML IV SOLN
500.0000 [IU] | Freq: Once | INTRAVENOUS | Status: AC
Start: 2015-05-21 — End: 2015-05-21
  Administered 2015-05-21: 500 [IU] via INTRAVENOUS
  Filled 2015-05-21: qty 5

## 2015-05-21 MED ORDER — SODIUM CHLORIDE 0.9 % IJ SOLN
10.0000 mL | INTRAMUSCULAR | Status: DC | PRN
  Administered 2015-05-21: 10 mL via INTRAVENOUS
  Filled 2015-05-21: qty 10

## 2015-05-21 MED ORDER — LIDOCAINE-PRILOCAINE 2.5-2.5 % EX CREA
TOPICAL_CREAM | CUTANEOUS | Status: AC
Start: 2015-05-21 — End: 2015-05-21
  Filled 2015-05-21: qty 5

## 2015-05-21 NOTE — Telephone Encounter (Signed)
per pof to sch pt appt-gave pt copy of avs °

## 2015-05-21 NOTE — Progress Notes (Signed)
[place]  [phone] Fax:([phone]) [phone]  Clinic follow Up Note   Patient Care Team: Seward Carol, MD as PCP - General (Internal Medicine) Erroll Luna, MD as Consulting Physician (General Surgery) Truitt Merle, MD as Consulting Physician (Hematology) Thea Silversmith, MD as Consulting Physician (Radiation Oncology) Rockwell Germany, RN as Registered Nurse Mauro Kaufmann, RN as Registered Nurse 05/21/2015  CHIEF COMPLAINTS  Follow up breast cancer   Oncology History   Breast cancer of upper-inner quadrant of left female breast   Staging form: Breast, AJCC 7th Edition     Clinical stage from 10/08/2014: Stage IIB (T2, N1, M0) - Unsigned     Pathologic: Stage IIB (T2, N1, cM0) - Unsigned       Breast cancer of upper-inner quadrant of left female breast   09/26/2014 Breast US 2.5 cm irregular mass in the upper inner left breast with adjacent 1.1 cm satellite mass and enlarged left axillary lymph nodes, highly suspicious for left breast malignancy and left axillary lymph node metastases. Tissue sampling is recommended.    09/26/2014 Mammogram Spot compression views of the left breast and routine views of both breasts demonstrate a 2 x 2.5 cm irregular mass in the upper inner left breast.    09/29/2014 Initial Biopsy 1. Breast, left, needle core biopsy, mass, 11 o'clock - INVASIVE MAMMARY CARCINOMA, SEE COMMENT. - MAMMARY CARCINOMA IN SITU. 2. Lymph node, needle/core biopsy, left axillary - ONE LYMPH NODE, POSITIVE FOR METASTATIC MAMMARY CARCINOMA (1/1).   09/29/2014 Receptors her2 Estrogen Receptor: 0%, NEGATIVE Progesterone Receptor: 0%, NEGATIVE, HER2 negative,  Proliferation Marker Ki67: 93%   10/01/2014 Initial Diagnosis Breast cancer of upper-inner quadrant of left female breast   10/17/2014 Imaging CT and bone scan negative for distant metastasis    10/24/2014 - 03/18/2015 Adjuvant Chemotherapy ddACx4 then weekly Taxol X12   11/14/2014 Miscellaneous Genetic testing  BreastNext @ [facility] was negative except 2 variants with unknown significance: ATM p.[id number] and RAD51C p.I52L   04/01/2015 Imaging breast MRI showed complete resolution of the previously described left breast mass and left axillary adenopathy. No other new lesions.   05/01/2015 Surgery Left breast lumpectomy and sentinel lymph nodes biopsy at [facility] eye doctor Howard-McNatt. Margins were negative.   05/01/2015 Pathology Results Left breast lumpectomy showed no residual invasive carcinoma. 2 sentinel lymph nodes were negative.     HISTORY OF PRESENTING ILLNESS:  Ashlee Hill 54 y.o. female presents to our multidisciplinary breast clinic today to discuss the management of her newly diagnosed breast cancer  She noticed a left breast lump in August 2015, no tenderness, skin or nipple change. She otherwise felt well. She did not seek immediate medical attention due to lack of insurance. She finally got her insurance approved and saw her primary care physician recently. She was referred for mammogram which showed a 2.5 cm irregular mass in the upper inner left breast. She underwent left breast mass and axillary node biopsy on 09/29/2014 and both biopsy showed invasive ductal carcinoma, ER negative, PR negative, HER-2 negative.  She otherwise feels well, no pain or ther complains. She has good appetite and her weight is stable.  CURRENT THERAPY: pending breast radiation   INTERIM HISTORY; Ashlee Hill returns for follow-up. She underwent left breast lumpectomy and sentinel lymph node biopsy at the [facility]. She tolerated surgery very well, and has been recovering well. Her left breast incision site is still little tender, and she has some skin reaction to the tape, so part of  her breast skin are still red. She otherwise is feeling well, has good appetite and energy level. She is scheduled to see radiation oncologist Dr. Pablo Ledger today.  MEDICAL HISTORY:  Past Medical History  Diagnosis Date    . Breast cancer of upper-inner quadrant of left female breast 10/01/2014  . Hypertension   . Family history of breast cancer   . Wears glasses     driving  . Diabetes mellitus without complication 7/61/60    chemo caused diabetes per pt     SURGICAL HISTORY: Past Surgical History  Procedure Laterality Date  . Tonsillectomy    . Wisdom tooth extraction    . Portacath placement Right 10/21/2014    Procedure: INSERTION PORT-A-CATH;  Surgeon: Erroll Luna, MD;  Location: [facility];  Service: General;  Laterality: Right;     GYN HISTORY  Menarchal: 12 LMP: 09/27/2014 Contraceptive: no HRT: no  G0P0    SOCIAL HISTORY: Social History   Social History  . Marital Status: Single    Spouse Name: N/A  . Number of Children: N/A  . Years of Education: N/A   Occupational History  . Not on file.   Social History Main Topics  . Smoking status: Never Smoker   . Smokeless tobacco: Never Used  . Alcohol Use: 0.6 oz/week    1 Glasses of wine per week     Comment: socail drinker  . Drug Use: No  . Sexual Activity: Not on file   Other Topics Concern  . Not on file   Social History Narrative    FAMILY HISTORY: Family History  Problem Relation Age of Onset  . Prostate cancer Father 92    currently 47  . Breast cancer Paternal Aunt 76    deceased 36  . Lung cancer Maternal Grandfather 35    smoker; deceased  . Thyroid cancer Cousin 61    pat first cousin; currently 84     Father had prostate cancer at age of 76 Paternal aunt had breast caner in her 23's Paternal cousin had thyroid cancer at age of before 53 Maternal grandfather had lung cancer    ALLERGIES:  is allergic to tegaderm ag mesh.  MEDICATIONS:  Current Outpatient Prescriptions  Medication Sig Dispense Refill  . ALPRAZolam (XANAX) 1 MG tablet Take 1-2 mg by mouth 2 (two) times daily. Take 1 tablet (1 mg) in the morning & Take 2 tablets (2 mg) in the evening.    Marland Kitchen aspirin 81 MG tablet Take  81 mg by mouth daily.    Marland Kitchen atorvastatin (LIPITOR) 10 MG tablet Take 10 mg by mouth daily.    . B-D UF III MINI PEN NEEDLES 31G X 5 MM MISC   0  . calcium carbonate (OS-CAL) 600 MG TABS tablet Take 600 mg by mouth daily with breakfast.    . gabapentin (NEURONTIN) 600 MG tablet Take 600 mg by mouth at bedtime. As needed for sleep or anxiety    . hydrochlorothiazide (HYDRODIURIL) 25 MG tablet Take 25 mg by mouth daily.     Marland Kitchen HYDROcodone-acetaminophen (NORCO) 5-325 MG tablet Take 1 tablet by mouth every 6 (six) hours as needed.    . insulin lispro (HUMALOG) 100 UNIT/ML injection Inject into the skin 2 (two) times daily. Sliding scale >150-4 u, >200 8 units, >250-12 units, > 300 16 units,    . LANTUS SOLOSTAR 100 UNIT/ML Solostar Pen Inject 30 Units into the skin at bedtime.   0  . metoprolol (LOPRESSOR)  100 MG tablet Take 100 mg by mouth daily.    . ONE TOUCH ULTRA TEST test strip TEST BLOOD SUGAR AS DIRECTED THREE TIMES A DAY  0  . ONETOUCH DELICA LANCETS 16W MISC use to STICK FINGER three times a day as directed  0  . venlafaxine XR (EFFEXOR-XR) 150 MG 24 hr capsule Take 150 mg by mouth daily with breakfast.    . zolpidem (AMBIEN) 10 MG tablet Take 10 mg by mouth at bedtime as needed for sleep.     No current facility-administered medications for this visit.    REVIEW OF SYSTEMS:   Constitutional: Denies fevers, chills or abnormal night sweats Eyes: Denies blurriness of vision, double vision or watery eyes Ears, nose, mouth, throat, and face: resolving mucositis and sore throat Respiratory: Denies cough, dyspnea or wheezes Cardiovascular: Denies palpitation, chest discomfort or lower extremity swelling Gastrointestinal:  Denies nausea, heartburn or change in bowel habits Skin: Denies abnormal skin rashes Lymphatics: Denies new lymphadenopathy or easy bruising Neurological:Denies numbness, tingling or new weaknesses Behavioral/Psych: Mood is stable, no new changes  All other systems were  reviewed with the patient and are negative.  PHYSICAL EXAMINATION: ECOG PERFORMANCE STATUS: 1  Filed Vitals:   05/21/15 0931  BP: 159/86  Pulse: 91  Temp: 98.2 F (36.8 C)  Resp: 18   Filed Weights   05/21/15 0931  Weight: 233 lb 11.2 oz (106.006 kg)    GENERAL:alert, no distress and comfortable SKIN: skin color, texture, turgor are normal, no rashes or significant lesions. Port site looks clean, (+) skin peeling on the bottom of left feet EYES: normal, conjunctiva are pink and non-injected, sclera clear OROPHARYNX:no exudate, no erythema and lips, buccal mucosa, and tongue normal  NECK: supple, thyroid normal size, non-tender, without nodularity LYMPH:  no palpable lymphadenopathy in the cervical, axillary or inguinal LUNGS: clear to auscultation and percussion with normal breathing effort HEART: regular rate & rhythm and no murmurs and no lower extremity edema ABDOMEN:abdomen soft, non-tender and normal bowel sounds Musculoskeletal:no cyanosis of digits and no clubbing  PSYCH: alert & oriented x 3 with fluent speech NEURO: no focal motor/sensory deficits Breasts: Breast inspection showed them to be symmetrical with no nipple discharge. (+) Skin erythema of upper left breast, no skin ulcers, the ACU site in the breast and left necks are healed well, there is fullness under the left breast incision, likely secondary to surgery. Exam of the right breast and axillary was normal.    LABORATORY DATA:  I have reviewed the data as listed CBC Latest Ref Rng 04/03/2015 03/18/2015 03/12/2015  WBC 3.9 - 10.3 10e3/uL 4.5 7.3 4.2  Hemoglobin 11.6 - 15.9 g/dL 11.6 9.8(L) 9.2(L)  Hematocrit 34.8 - 46.6 % 34.5(L) 29.7(L) 26.7(L)  Platelets 145 - 400 10e3/uL 197 211 223     Recent Labs  12/04/14 1134 12/04/14 1820 12/05/14 0520 12/06/14 0530  03/12/15 1404 03/18/15 1127 04/03/15 0918  NA 129*  --  138 138  < > 136 138 138  K 3.6  --  3.3* 4.1  < > 3.8 4.3 4.6  CL 91*  --  103 106   --   --   --   --   CO2 25  --  27 24  < > 21* 24 20*  GLUCOSE 422*  --  227* 204*  < > 165* 152* 176*  BUN 10  --  7 <5*  < > 11.3 11.5 11.1  CREATININE 1.27*  --  1.03 0.79  < >  0.9 1.1 0.8  CALCIUM 8.8  --  8.1* 7.7*  < > 9.8 9.3 9.1  GFRNONAA 48*  --  61* >90  --   --   --   --   GFRAA 55*  --  71* >90  --   --   --   --   PROT 7.5  --  5.9*  --   < > 7.0 6.7 6.6  ALBUMIN 4.5  --  3.3*  --   < > 3.8 3.4* 3.5  AST 36  --  28  --   < > 94* 35* 20  ALT 28  --  22  --   < > 61* 33 20  ALKPHOS 92  --  62  --   < > 83 99 101  BILITOT 1.3* 0.9 0.8  --   < > 1.43* 0.95 0.46  BILIDIR  --  0.2  --   --   --   --   --   --   IBILI  --  0.7  --   --   --   --   --   --   < > = values in this interval not displayed.  PATHOLOGY REPORT 09/26/2014 Diagnosis 1. Breast, left, needle core biopsy, mass, 11 o'clock - INVASIVE MAMMARY CARCINOMA, SEE COMMENT. - MAMMARY CARCINOMA IN SITU. 2. Lymph node, needle/core biopsy, left axillary - ONE LYMPH NODE, POSITIVE FOR METASTATIC MAMMARY CARCINOMA (1/1). Microscopic Comment 1. Although grade of tumor is best assessed at resection, with these biopsies, both the in situ and invasive carcinoma are grade II. The invasive carcinoma demonstrates strong diffuse E-cadherin expression; supporting a ductal phenotype. 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  Estrogen Receptor: 0%, NEGATIVE Progesterone Receptor: 0%, NEGATIVE Proliferation Marker Ki67: 93% 1. A sample was sent to [facility] for HER-2 testing by FISH. The results are as follows: Negative.   ACCESSION NUMBER:[id number]  RECEIVED: 05/01/2015  ORDERING PHYSICIAN:MARISSA HOWARD-MCNATT , MD  PATIENT NAME:Beagley, Divya L  SURGICAL PATHOLOGY REPORT    FINAL PATHOLOGIC DIAGNOSIS  MICROSCOPIC EXAMINATION AND DIAGNOSIS    A.LEFT AXILLA  SENTINEL LYMPH NODE #1, EXCISION:  No carcinoma identified in one lymph node (0/1).    B.LEFT AXILLA SENTINEL LYMPH NODE #2, EXCISION:  No carcinoma identified in one lymph node (0/1).    C.LEFT BREAST, LUMPECTOMY:  No residual invasive carcinoma identified.  Fibrosis with chronic inflammation and foreign body giant cell  reaction at previous core needle biopsy site.  Fibrocystic changes.  See comment.    D.LEFT BREAST, DEEP MARGIN, EXCISION:  Benign breast adipose tissue.  Negative for malignancy.   RADIOGRAPHIC STUDIES: I have personally reviewed the radiological images as listed and agreed with the findings in the report.  Mr Breast Bilateral W Wo Contrast 10/08/2014    IMPRESSION: 1. Biopsy proven malignancy in the upper inner left breast with adjacent/contiguous areas of nodularity measures up to 3.5 cm, with multiple morphologically abnormal axillary lymph nodes compatible with known axillary metastases.  2. No MRI evidence of malignancy in the right breast.  RECOMMENDATION: Treatment plan for known left breast malignancy.  BI-RADS CATEGORY  6: Known biopsy-proven malignancy.   Electronically Signed   By: Everlean Alstrom M.D.   On: 10/08/2014 10:29   US Breast Ltd Uni Left Inc Axilla 09/26/2014    IMPRESSION: 2.5 cm irregular mass in the upper inner left  breast with adjacent 1.1 cm satellite mass and enlarged left axillary lymph nodes, highly suspicious for left breast malignancy and left axillary lymph node metastases. Tissue sampling is recommended.  No mammographic evidence of right breast malignancy.    CT chest, abdomen and pelvis with contrast 10/17/2014 IMPRESSION: 1. Left breast lesion is identified compatible with the clinical history of breast cancer. 2. Enlarged left axillary lymph nodes suspicious for metastatic adenopathy. 3. No specific features identified to suggest distant  metastatic disease. 4. Hepatic steatosis.  Bone scan 10/17/2014 IMPRESSION: 1. The uptake pattern within the skeleton is not highly suspicious for malignancy. However, subtle increased uptake in the midshaft of the right humerus and the lower lumbar spine posteriorly merits further evaluation with plain films. 2. The uptake over the lower extremities is consistent with degenerative change.  Lumbar spine X-ray 10/20/14 IMPRESSION: No lytic or sclerotic osseous lesion.  Probable degenerative facet osteoarthritic change at L4-L5 and L5-S1 which may correspond to the area of abnormal uptake at recent bone scan.  RIGHT HUMERUS X-RAY 10/20/2014 IMPRESSION: No abnormality seen to correspond to abnormal uptake seen on bone scan  Breast MRI 04/01/2015 IMPRESSION: Previously described mass in upper-inner quadrant of the left breast and left axillary adenopathy are no longer evident with MR imaging consistent with response to neoadjuvant chemotherapy.  ASSESSMENT & PLAN:  54 year old pre-menopausal female with past medical history of hypertension, presented with a palpable left breast mass.  1. Left breast invasive ductal carcinoma, cT2N1M0, stage IIB, triple negative, ypT0N0 -I reviewed her surgical pathology findings, which showed complete pathological response. 2 sentinel lymph nodes were negative. -We discussed that complete pathology response predicts good outcome, although she still has small risk of cancer recurrence after surgery. -I do not recommend any additional adjuvant chemotherapy -She will not benefit from antiestrogen therapy -She will start breast irradiation soon -We discussed breast cancer surveillance, with annual mammogram, physical exam and lab every 3-4 months for the first 2 years, and every 6 months for additional 3 years. -Her port was not removed during her breast surgery, she is thinking about removing it. She will let me know when she is ready.  2.  Genetics  --Given her young age and triple negative disease, positive family history, she was referred to see genetic counselor for genetic testing, her genetic testing was negative.  3.Anxiety and coping -Better, she still takes Xanax as needed  4. Newly diagnosed type 2 diabetes -Continue Lantus and insulin sliding scale, follow-up with her primary care physician. -better lately after off chemo    Plan -She will see Dr. Pablo Ledger today, and start breast irradiation  -I'll see her back in 3 months. -Port flush in 6 weeks and next visit  All questions were answered. The patient knows to call the clinic with any problems, questions or concerns. I spent 20 minutes counseling the patient face to face. The total time spent in the appointment was 25 minutes and more than 50% was on counseling.     Truitt Merle, MD 05/21/2015 10:18 AM

## 2015-05-21 NOTE — Patient Instructions (Signed)

## 2015-05-29 NOTE — Progress Notes (Addendum)
Patient for func appointment status post surgery to left breast on April 30, 2015 at [facility]. Here to start next step in treatment of signing consent and having ct simulation to start radiation.denies pain. Has new diagnosis of cataracts which she will have surgery prior to end of year and will also schedule appointment with surgeon to have porta-cath removed. Completed chemotherapy on 03/18/15.  See nursing assessment 04/23/15.

## 2015-06-03 ENCOUNTER — Ambulatory Visit
Admission: RE | Admit: 2015-06-03 | Discharge: 2015-06-03 | Disposition: A | Discharge status: Home / Self Care | Payer: 59 | Source: Ambulatory Visit | Attending: Radiation Oncology | Admitting: Radiation Oncology

## 2015-06-03 ENCOUNTER — Other Ambulatory Visit: Payer: 59

## 2015-06-03 ENCOUNTER — Ambulatory Visit: Admission: RE | Admit: 2015-06-03 | Payer: 59 | Source: Ambulatory Visit | Admitting: Radiation Oncology

## 2015-06-03 ENCOUNTER — Ambulatory Visit (HOSPITAL_BASED_OUTPATIENT_CLINIC_OR_DEPARTMENT_OTHER): Payer: 59

## 2015-06-03 ENCOUNTER — Inpatient Hospital Stay: Admission: RE | Admit: 2015-06-03 | Payer: 59 | Source: Ambulatory Visit

## 2015-06-03 ENCOUNTER — Other Ambulatory Visit (HOSPITAL_BASED_OUTPATIENT_CLINIC_OR_DEPARTMENT_OTHER): Payer: 59

## 2015-06-03 VITALS — BP 158/91 | HR 79 | Temp 98.2°F | Resp 19

## 2015-06-03 VITALS — BP 154/90 | HR 75 | Temp 98.3°F | Wt 237.1 lb

## 2015-06-03 DIAGNOSIS — Z452 Encounter for adjustment and management of vascular access device: Secondary | ICD-10-CM | POA: Diagnosis not present

## 2015-06-03 DIAGNOSIS — C50212 Malignant neoplasm of upper-inner quadrant of left female breast: Secondary | ICD-10-CM

## 2015-06-03 DIAGNOSIS — Z95828 Presence of other vascular implants and grafts: Secondary | ICD-10-CM

## 2015-06-03 LAB — CBC WITH DIFFERENTIAL/PLATELET
BASO%: 0.9 % (ref 0.0–2.0)
BASOS ABS: 0.1 10*3/uL (ref 0.0–0.1)
EOS%: 0.5 % (ref 0.0–7.0)
Eosinophils Absolute: 0 10*3/uL (ref 0.0–0.5)
HCT: 41.4 % (ref 34.8–46.6)
HGB: 14.1 g/dL (ref 11.6–15.9)
LYMPH%: 21.3 % (ref 14.0–49.7)
MCH: 36.4 pg — AB (ref 25.1–34.0)
MCHC: 34.1 g/dL (ref 31.5–36.0)
MCV: 106.6 fL — ABNORMAL HIGH (ref 79.5–101.0)
MONO#: 0.3 10*3/uL (ref 0.1–0.9)
MONO%: 5.7 % (ref 0.0–14.0)
NEUT#: 4.2 10*3/uL (ref 1.5–6.5)
NEUT%: 71.6 % (ref 38.4–76.8)
Platelets: 161 10*3/uL (ref 145–400)
RBC: 3.88 10*6/uL (ref 3.70–5.45)
RDW: 14.4 % (ref 11.2–14.5)
WBC: 5.9 10*3/uL (ref 3.9–10.3)
lymph#: 1.3 10*3/uL (ref 0.9–3.3)

## 2015-06-03 LAB — COMPREHENSIVE METABOLIC PANEL (CC13)
ALT: 42 U/L (ref 0–55)
AST: 40 U/L — AB (ref 5–34)
Albumin: 4 g/dL (ref 3.5–5.0)
Alkaline Phosphatase: 113 U/L (ref 40–150)
Anion Gap: 9 mEq/L (ref 3–11)
BILIRUBIN TOTAL: 0.71 mg/dL (ref 0.20–1.20)
BUN: 14.4 mg/dL (ref 7.0–26.0)
CALCIUM: 9.5 mg/dL (ref 8.4–10.4)
CO2: 24 meq/L (ref 22–29)
CREATININE: 0.8 mg/dL (ref 0.6–1.1)
Chloride: 103 mEq/L (ref 98–109)
EGFR: 81 mL/min/{1.73_m2} — ABNORMAL LOW (ref >90)
GLUCOSE: 167 mg/dL — AB (ref 70–140)
POTASSIUM: 4.9 meq/L (ref 3.5–5.1)
SODIUM: 136 meq/L (ref 136–145)
Total Protein: 7.4 g/dL (ref 6.4–8.3)

## 2015-06-03 MED ORDER — HEPARIN SOD (PORK) LOCK FLUSH 100 UNIT/ML IV SOLN
500.0000 [IU] | Freq: Once | INTRAVENOUS | Status: AC
  Administered 2015-06-03: 500 [IU] via INTRAVENOUS
  Filled 2015-06-03: qty 5

## 2015-06-03 MED ORDER — SODIUM CHLORIDE 0.9 % IJ SOLN
10.0000 mL | INTRAMUSCULAR | Status: DC | PRN
  Administered 2015-06-03: 10 mL via INTRAVENOUS
  Filled 2015-06-03: qty 10

## 2015-06-03 NOTE — Progress Notes (Signed)
Radiation Oncology         [phone]) [phone] ________________________________  Name: Ashlee Hill      MRN: [id number]          Date: 06/03/2015              DOB: 1961-06-02  Optical Surface Tracking Plan:  Since intensity modulated radiotherapy (IMRT) and 3D conformal radiation treatment methods are predicated on accurate and precise positioning for treatment, intrafraction motion monitoring is medically necessary to ensure accurate and safe treatment delivery.  The ability to quantify intrafraction motion without excessive ionizing radiation dose can only be performed with optical surface tracking. Accordingly, surface imaging offers the opportunity to obtain 3D measurements of patient position throughout IMRT and 3D treatments without excessive radiation exposure.  I am ordering optical surface tracking for this patient's upcoming course of radiotherapy. ________________________________ Signature   Reference:   Ursula Alert, J, et al. Surface imaging-based analysis of intrafraction motion for breast radiotherapy patients.Journal of [place], n. 6, nov. 2014. ISSN [id number].   Available at: <http://www.jacmp.org/index.php/jacmp/article/view/4957>.

## 2015-06-03 NOTE — Patient Instructions (Signed)

## 2015-06-03 NOTE — Progress Notes (Addendum)
  Radiation Oncology         [phone]) [phone] ________________________________  Initial outpatient Consultation - Date: 06/03/2015   Name: Ashlee Hill MRN: [id number]   DOB: [birth year]  REFERRING PHYSICIAN: Truitt Merle, MD  DIAGNOSIS AND STAGE: Breast cancer of upper-inner quadrant of left female breast [facility])   Staging form: Breast, AJCC 7th Edition     Clinical stage from 10/08/2014: Stage IIB (T2, N1, M0) - Unsigned     Pathologic stage from 05/01/2015: yT0, N0, cM0 - Signed by Truitt Merle, MD on 05/21/2015     Pathologic: No stage assigned - Unsigned   HISTORY OF PRESENT ILLNESS::Ashlee Hill is a 54 y.o. female with a history of left breast cancer. She underwent neoadjuvant chemotherapy with ddAC x4 and weekly Taxol. She then underwent a lumpectomy and sentinel node biopsy on 04/30/2015 at [facility] that showed no residual tumor in her breast and 0 out of 2 sentinel lymph nodes positive for malignancy.   The patient prefers to go by Ashlee Hill. She will lose her insurance at the end of this year, so she would like to start treatment as soon as possible. The patient notes that she is planning to have her port-a-cath removed and undergo cataract surgery before the end of the year.   PREVIOUS RADIATION THERAPY: No  Past medical, social and family history were reviewed in the electronic chart. Review of symptoms was reviewed in the electronic chart. Medications were reviewed in the electronic chart.   PHYSICAL EXAM:  Vitals with BMI 06/03/2015  Height   Weight 237 lbs 2 oz  BMI   Systolic 237  Diastolic 90  Pulse 75  Respirations    She has a large palpable seroma cavity over the left breast. She has bruising over the left breast as well, this is tender to palpation. No palpable adenopathy and her sentinel lymph node incision is healing well.   IMPRESSION: T2 N1 left breast cancer with a pathologic complete response to chemotherapy  PLAN: I spoke to the patient today regarding her  diagnosis and options for treatment. We discussed the equivalence in terms of survival and local failure between mastectomy and breast conservation. We discussed the role of radiation in decreasing local failures in patients who undergo lumpectomy even in the setting of a pathologic complete response. We discussed the process of simulation and the placement tattoos. We discussed 6 weeks of treatment as an outpatient. We discussed the possibility of asymptomatic lung damage. We discussed the low likelihood of secondary malignancies. We discussed the possible side effects including but not limited to skin redness, fatigue, permanent skin darkening, and breast swelling.  We discussed the use of cardiac sparing with deep inspiration breath hold if needed. She signed informed consent.   We discussed enrollment in NSABP B51.  She declined.   [name] will undergo simulation and treatment planning later today. I have advised for [name] to schedule her port-a-cath removal for one week before Christmas if possible.    This document serves as a record of services personally performed by Thea Silversmith, MD. It was created on her behalf by Arlyce Harman, a trained medical scribe. The creation of this record is based on the scribe's personal observations and the provider's statements to them. This document has been checked and approved by the attending provider.    ------------------------------------------------  Thea Silversmith, MD

## 2015-06-03 NOTE — Progress Notes (Signed)
Please see the Nurse Progress Note in the MD Initial Consult Encounter for this patient. 

## 2015-06-03 NOTE — Progress Notes (Signed)
Name: Ashlee Hill   MRN: [id number]  Date:  06/03/2015  DOB: 01/13/1961  Status:outpatient   DIAGNOSIS: Left Breast cancer.  CONSENT VERIFIED: yes SET UP: Patient is setup supine  IMMOBILIZATION:  The following immobilization was used:Custom Moldable Pillow, breast board.  NARRATIVE: Ashlee Hill was brought to the [facility].  Identity was confirmed.  All relevant records and images related to the planned course of therapy were reviewed.  Then, the patient was positioned in a stable reproducible clinical set-up for radiation therapy.  Wires were placed to delineate the clinical extent of breast tissue. A wire was placed on the scar as well.  CT images were obtained.  An isocenter was placed. Skin markings were placed.  The position of the heart was then analyzed.  Due to the proximity of the heart to the chest wall, I felt she would benefit from deep inspiration breath hold for cardiac sparing.  She was then coached and rescanned in the breath hold position.  Acceptable cardiac sparing was achieved. The CT images were loaded into the planning software where the target and avoidance structures were contoured.  The radiation prescription was entered and confirmed. The patient was discharged in stable condition and tolerated simulation well.    TREATMENT PLANNING NOTE/3D Simulation Note Treatment planning then occurred. I have requested : MLC's, isodose plan, basic dose calculation  3D simulation was performed.  I personally supervised and oversaw the construction of 5 medically necessary complex treatment devices in the form of MLCs which will be used for beam modification purposes and to protect critical structures including the heart and lung as well as the immobilization devices which will be used to ensure reproducible set up.  I have requested a dose volume histogram of the heart lung and tumor cavity.    Special treatment procedure was performed today due to the extra  time and effort required by myself to plan and prepare this patient for deep inspiration breath hold technique.  I have determined cardiac sparing to be of benefit to this patient to prevent long term cardiac damage due to radiation of the heart.  Bellows were placed on the patient's abdomen. To facilitate cardiac sparing, the patient was coached by the radiation therapists on breath hold techniques and breathing practice was performed. Practice waveforms were obtained. The patient was then scanned while maintaining breath hold in the treatment position.  This image was then transferred over to the imaging specialist. The imaging specialist then created a fusion of the free breathing and breath hold scans using the chest wall as the stable structure. I personally reviewed the fusion in axial, coronal and sagittal image planes.  Excellent cardiac sparing was obtained.  I felt the patient is an appropriate candidate for breath hold and the patient will be treated as such.  The image fusion was then reviewed with the patient to reinforce the necessity of reproducible breath hold.

## 2015-06-10 ENCOUNTER — Ambulatory Visit: Admission: RE | Admit: 2015-06-10 | Payer: 59 | Source: Ambulatory Visit | Admitting: Radiation Oncology

## 2015-06-11 ENCOUNTER — Ambulatory Visit: Payer: 59

## 2015-06-11 ENCOUNTER — Telehealth: Payer: Self-pay | Admitting: Radiation Oncology

## 2015-06-11 NOTE — Telephone Encounter (Signed)
Patient left message requesting return call to discuss effects of having porta cath removed during xrt. Returned patient's call to obtain more details. Discuss findings with Dr. Pablo Ledger. Phoned patient back. Explained that Dr. Pablo Ledger would prefer the patient have her port removed one -two s/p radiation treatment. Patient verbalized understanding. Patient plans to phone surgeon to move her port removal appointment out from November 15. Patient expressed the need to have port removed before her deductible was up for the year.

## 2015-06-11 NOTE — Addendum Note (Signed)
Encounter addended by: Doreen Beam, RN on: 06/11/2015 11:37 AM<BR>     Documentation filed: Charges VN

## 2015-06-12 ENCOUNTER — Encounter: Payer: Self-pay | Admitting: General Practice

## 2015-06-12 ENCOUNTER — Ambulatory Visit: Payer: 59

## 2015-06-12 NOTE — Progress Notes (Signed)
Spiritual Care Note  Ashlee Hill called today to share and process her updates.  She has worked very hard to coordinate all of her care (rad tx, cataract surgery, port removal, and other details in two health systems) and to complete all parts prior to the end of 2016, when she will lose her heath insurance.  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  This is particularly valuable as she prepares to begin her job search in the next couple of months.  She describes how her cancer experience has brought her family (pt and her parents) closer, including helping them become clear that they would like to find a new church together.  Church and prayer have been mainstays in the past, and faith/prayer/spirituality remains Ashlee Hill's strongest tool for coping and meaning-making.  Aside from job-search anxiety, Ashlee Hill's biggest current struggle is her isolation, which is a function of her deep discomfort with going out ("even in a movie theater, and I'm a manic movie person!") before her hair grows back.  She explored how much her hair affects her sense of identity, particularly as a woman, and the challenges she feels with scarves and wig.  Her workarounds to reduce her sense of isolation include installing wifi, watching spiritual/preaching shows, and "finally" allowing her parents to see her uncovered head.  They plan to go out for dinner together tonight, which has been a form of support to look forward to.    We plan to schedule time to meet as her radiation begins, but please also page as needs arise or circumstances change.  Thank you.  Ashlee Hill, [place], [facility] Pager [phone] Voicemail [phone]

## 2015-06-15 ENCOUNTER — Ambulatory Visit: Payer: 59

## 2015-06-16 ENCOUNTER — Ambulatory Visit: Payer: 59

## 2015-06-16 ENCOUNTER — Encounter: Payer: Self-pay | Admitting: Radiation Oncology

## 2015-06-17 ENCOUNTER — Ambulatory Visit: Payer: 59 | Admitting: Radiation Oncology

## 2015-06-18 ENCOUNTER — Ambulatory Visit
Admission: RE | Admit: 2015-06-18 | Discharge: 2015-06-18 | Disposition: A | Discharge status: Home / Self Care | Payer: 59 | Source: Ambulatory Visit | Attending: Radiation Oncology | Admitting: Radiation Oncology

## 2015-06-18 ENCOUNTER — Ambulatory Visit: Payer: 59

## 2015-06-19 ENCOUNTER — Ambulatory Visit: Payer: 59

## 2015-06-22 ENCOUNTER — Ambulatory Visit: Payer: 59 | Admitting: Radiation Oncology

## 2015-06-22 ENCOUNTER — Ambulatory Visit: Payer: 59

## 2015-06-23 ENCOUNTER — Ambulatory Visit: Payer: 59

## 2015-06-23 ENCOUNTER — Ambulatory Visit
Admission: RE | Admit: 2015-06-23 | Discharge: 2015-06-23 | Disposition: A | Discharge status: Home / Self Care | Payer: 59 | Source: Ambulatory Visit | Attending: Radiation Oncology | Admitting: Radiation Oncology

## 2015-06-24 ENCOUNTER — Ambulatory Visit
Admission: RE | Admit: 2015-06-24 | Discharge: 2015-06-24 | Disposition: A | Discharge status: Home / Self Care | Payer: 59 | Source: Ambulatory Visit | Attending: Radiation Oncology | Admitting: Radiation Oncology

## 2015-06-24 ENCOUNTER — Ambulatory Visit: Payer: 59

## 2015-06-24 ENCOUNTER — Telehealth: Payer: Self-pay | Admitting: *Deleted

## 2015-06-24 NOTE — Telephone Encounter (Signed)
Called Ms. Ashlee Hill to assess her status.  She states that since she her encounter at [facility] - "draining of hematoma", her breast feels better, but remains bruised.

## 2015-06-25 ENCOUNTER — Ambulatory Visit
Admission: RE | Admit: 2015-06-25 | Discharge: 2015-06-25 | Disposition: A | Discharge status: Home / Self Care | Payer: 59 | Source: Ambulatory Visit | Attending: Radiation Oncology | Admitting: Radiation Oncology

## 2015-06-26 ENCOUNTER — Ambulatory Visit
Admission: RE | Admit: 2015-06-26 | Discharge: 2015-06-26 | Disposition: A | Discharge status: Home / Self Care | Payer: 59 | Source: Ambulatory Visit | Attending: Radiation Oncology | Admitting: Radiation Oncology

## 2015-06-29 ENCOUNTER — Ambulatory Visit
Admission: RE | Admit: 2015-06-29 | Discharge: 2015-06-29 | Disposition: A | Discharge status: Home / Self Care | Payer: 59 | Source: Ambulatory Visit | Attending: Radiation Oncology | Admitting: Radiation Oncology

## 2015-06-30 ENCOUNTER — Ambulatory Visit
Admission: RE | Admit: 2015-06-30 | Discharge: 2015-06-30 | Disposition: A | Discharge status: Home / Self Care | Payer: 59 | Source: Ambulatory Visit | Attending: Radiation Oncology | Admitting: Radiation Oncology

## 2015-06-30 ENCOUNTER — Encounter: Payer: Self-pay | Admitting: Radiation Oncology

## 2015-06-30 VITALS — BP 157/110 | HR 64 | Temp 98.3°F | Ht 65.0 in | Wt 243.6 lb

## 2015-06-30 DIAGNOSIS — C50212 Malignant neoplasm of upper-inner quadrant of left female breast: Secondary | ICD-10-CM | POA: Insufficient documentation

## 2015-06-30 MED ORDER — ALRA NON-METALLIC DEODORANT (RAD-ONC)
1.0000 "application " | Freq: Once | TOPICAL | Status: AC
  Administered 2015-06-30: 1 via TOPICAL

## 2015-06-30 MED ORDER — RADIAPLEXRX EX GEL
Freq: Once | CUTANEOUS | Status: AC
  Administered 2015-06-30: 12:00:00 via TOPICAL

## 2015-06-30 NOTE — Progress Notes (Signed)
Ashlee Hill is here for her 5th fraction of radiation to her Left Breast. She does report fatigue, and is receiving help from friends for some daily activities. She is able to complete most activities on her own. She denies pain, but does report an occasional shooting pain in both her breasts. She has redness at her incision site, and bruising around her nipple and under her breast from a hematoma at her incision site. The area is tender and slightly swollen.   BP 157/110 mmHg  Pulse 64  Temp(Src) 98.3 F (36.8 C)  Ht 5\' 5"  (1.651 m)  Wt 243 lb 9.6 oz (110.496 kg)  BMI 40.54 kg/m2

## 2015-06-30 NOTE — Progress Notes (Signed)
Pt here for patient teaching.  Pt given Radiation and You booklet, skin care instructions, Alra deodorant and Radiaplex gel. Pt was given the link to watch the video.  Reviewed areas of pertinence such as fatigue, skin changes, breast tenderness, breast swelling, cough, shortness of breath, earaches and taste changes . Pt able to give teach back of to pat skin, use unscented/gentle soap and drink plenty of water,apply Radiaplex bid, avoid applying anything to skin within 4 hours of treatment, avoid wearing an under wire bra and to use an electric razor if they must shave. Pt verbalizes understanding of information given and will contact nursing with any questions or concerns.

## 2015-06-30 NOTE — Progress Notes (Signed)
Weekly Management Note Current Dose: 9  Gy  Projected Dose: 61 Gy   Narrative:  The patient presents for routine under treatment assessment.  CBCT/MVCT images/Port film x-rays were reviewed.  The chart was checked. Anxious about treatment. Right breast twinges since starting RT.  Bruising continues.   Physical Findings: Weight: 243 lb 9.6 oz (110.496 kg). Unchanged. Bruised left breast. No other skin changes.   Impression:  The patient is tolerating radiation.  Plan:  Continue treatment as planned. Start radiaplex.

## 2015-07-01 ENCOUNTER — Ambulatory Visit: Payer: 59

## 2015-07-01 ENCOUNTER — Ambulatory Visit
Admission: RE | Admit: 2015-07-01 | Discharge: 2015-07-01 | Disposition: A | Discharge status: Home / Self Care | Payer: 59 | Source: Ambulatory Visit | Attending: Radiation Oncology | Admitting: Radiation Oncology

## 2015-07-02 ENCOUNTER — Ambulatory Visit
Admission: RE | Admit: 2015-07-02 | Discharge: 2015-07-02 | Disposition: A | Discharge status: Home / Self Care | Payer: 59 | Source: Ambulatory Visit | Attending: Radiation Oncology | Admitting: Radiation Oncology

## 2015-07-02 ENCOUNTER — Telehealth: Payer: Self-pay | Admitting: *Deleted

## 2015-07-02 NOTE — Telephone Encounter (Signed)
Spoke with patient after starting radiation.  She states she is having a harder time emotionally with this than chemo.   Otherwise she is doing ok.  Encouraged her to call with any needs or concerns.

## 2015-07-03 ENCOUNTER — Ambulatory Visit
Admission: RE | Admit: 2015-07-03 | Discharge: 2015-07-03 | Disposition: A | Discharge status: Home / Self Care | Payer: 59 | Source: Ambulatory Visit | Attending: Radiation Oncology | Admitting: Radiation Oncology

## 2015-07-06 ENCOUNTER — Ambulatory Visit
Admission: RE | Admit: 2015-07-06 | Discharge: 2015-07-06 | Disposition: A | Discharge status: Home / Self Care | Payer: 59 | Source: Ambulatory Visit | Attending: Radiation Oncology | Admitting: Radiation Oncology

## 2015-07-07 ENCOUNTER — Encounter: Payer: Self-pay | Admitting: Radiation Oncology

## 2015-07-07 ENCOUNTER — Ambulatory Visit
Admission: RE | Admit: 2015-07-07 | Discharge: 2015-07-07 | Disposition: A | Discharge status: Home / Self Care | Payer: 59 | Source: Ambulatory Visit | Attending: Radiation Oncology | Admitting: Radiation Oncology

## 2015-07-07 VITALS — BP 182/101 | HR 95 | Temp 98.2°F | Ht 65.0 in | Wt 239.4 lb

## 2015-07-07 DIAGNOSIS — C50212 Malignant neoplasm of upper-inner quadrant of left female breast: Secondary | ICD-10-CM

## 2015-07-07 NOTE — Progress Notes (Signed)
Weekly Management Note Current Dose: 16.2  Gy  Projected Dose: 61 Gy   Narrative:  The patient presents for routine under treatment assessment.  CBCT/MVCT images/Port film x-rays were reviewed.  The chart was checked. Anxious about treatment. Right breast twinges since starting RT.  Bruising continues. Moved PAC removal back by 1 week.   Physical Findings: Weight: 239 lb 6.4 oz (108.591 kg). Unchanged. Bruised left breast. No other skin changes.   Impression:  The patient is tolerating radiation.  Plan:  Continue treatment as planned. Continue radiaplex.

## 2015-07-07 NOTE — Progress Notes (Signed)
Ms. Doty has received 9 fractions to her left breast.  She denies any pain in her right breast.  She reports that she is fatigued and is not sleeping well and attributes this to anxiety. Note bruising in the nipple region and erythema in the area of her hematoma.  The inframmary fold is intact with mild erythema.

## 2015-07-08 ENCOUNTER — Ambulatory Visit
Admission: RE | Admit: 2015-07-08 | Discharge: 2015-07-08 | Disposition: A | Discharge status: Home / Self Care | Payer: 59 | Source: Ambulatory Visit | Attending: Radiation Oncology | Admitting: Radiation Oncology

## 2015-07-09 ENCOUNTER — Ambulatory Visit
Admission: RE | Admit: 2015-07-09 | Discharge: 2015-07-09 | Disposition: A | Discharge status: Home / Self Care | Payer: 59 | Source: Ambulatory Visit | Attending: Radiation Oncology | Admitting: Radiation Oncology

## 2015-07-10 ENCOUNTER — Ambulatory Visit
Admission: RE | Admit: 2015-07-10 | Discharge: 2015-07-10 | Disposition: A | Discharge status: Home / Self Care | Payer: 59 | Source: Ambulatory Visit | Attending: Radiation Oncology | Admitting: Radiation Oncology

## 2015-07-12 ENCOUNTER — Ambulatory Visit
Admission: RE | Admit: 2015-07-12 | Discharge: 2015-07-12 | Disposition: A | Discharge status: Home / Self Care | Payer: 59 | Source: Ambulatory Visit | Attending: Radiation Oncology | Admitting: Radiation Oncology

## 2015-07-12 NOTE — Progress Notes (Signed)
Weekly Management Note Current Dose: 27 Gy  Projected Dose: 61 Gy   Narrative:  The patient presents for routine under treatment assessment.  CBCT/MVCT images/Port film x-rays were reviewed.  The chart was checked. No complaints. Cataract surgery scheduled for December.    Physical Findings: Weight:  . No skin changes.    Impression:  The patient is tolerating radiation.  Plan:  Continue treatment as planned. Continue radiaplex.  ------------------------------------------------  Thea Silversmith, MD

## 2015-07-13 ENCOUNTER — Ambulatory Visit
Admission: RE | Admit: 2015-07-13 | Discharge: 2015-07-13 | Disposition: A | Discharge status: Home / Self Care | Payer: 59 | Source: Ambulatory Visit | Attending: Radiation Oncology | Admitting: Radiation Oncology

## 2015-07-14 ENCOUNTER — Ambulatory Visit
Admission: RE | Admit: 2015-07-14 | Discharge: 2015-07-14 | Disposition: A | Discharge status: Home / Self Care | Payer: 59 | Source: Ambulatory Visit | Attending: Radiation Oncology | Admitting: Radiation Oncology

## 2015-07-14 ENCOUNTER — Encounter: Payer: Self-pay | Admitting: Radiation Oncology

## 2015-07-14 VITALS — BP 152/112 | HR 101 | Temp 98.7°F | Ht 65.0 in | Wt 236.3 lb

## 2015-07-14 DIAGNOSIS — C50212 Malignant neoplasm of upper-inner quadrant of left female breast: Secondary | ICD-10-CM

## 2015-07-14 NOTE — Progress Notes (Signed)
Ms. Riofrio has received 15 fractions to her let breast.  Note increased erythema in the inframmary fold, but skin intact. Denies any pain today, but reports tenderness in the tx field and reports fatigue.

## 2015-07-15 ENCOUNTER — Ambulatory Visit
Admission: RE | Admit: 2015-07-15 | Discharge: 2015-07-15 | Disposition: A | Discharge status: Home / Self Care | Payer: 59 | Source: Ambulatory Visit | Attending: Radiation Oncology | Admitting: Radiation Oncology

## 2015-07-15 ENCOUNTER — Ambulatory Visit (HOSPITAL_BASED_OUTPATIENT_CLINIC_OR_DEPARTMENT_OTHER): Payer: 59

## 2015-07-15 VITALS — BP 142/102 | HR 97 | Temp 97.9°F | Resp 14

## 2015-07-15 DIAGNOSIS — Z452 Encounter for adjustment and management of vascular access device: Secondary | ICD-10-CM | POA: Diagnosis not present

## 2015-07-15 DIAGNOSIS — C50212 Malignant neoplasm of upper-inner quadrant of left female breast: Secondary | ICD-10-CM | POA: Diagnosis not present

## 2015-07-15 DIAGNOSIS — Z95828 Presence of other vascular implants and grafts: Secondary | ICD-10-CM

## 2015-07-15 MED ORDER — SODIUM CHLORIDE 0.9 % IJ SOLN
10.0000 mL | INTRAMUSCULAR | Status: DC | PRN
  Administered 2015-07-15: 10 mL via INTRAVENOUS
  Filled 2015-07-15: qty 10

## 2015-07-15 MED ORDER — HEPARIN SOD (PORK) LOCK FLUSH 100 UNIT/ML IV SOLN
500.0000 [IU] | Freq: Once | INTRAVENOUS | Status: AC
  Administered 2015-07-15: 500 [IU] via INTRAVENOUS
  Filled 2015-07-15: qty 5

## 2015-07-15 NOTE — Patient Instructions (Signed)

## 2015-07-17 ENCOUNTER — Ambulatory Visit: Payer: 59

## 2015-07-18 ENCOUNTER — Ambulatory Visit: Payer: 59

## 2015-07-19 ENCOUNTER — Ambulatory Visit: Payer: 59

## 2015-07-20 ENCOUNTER — Ambulatory Visit
Admission: RE | Admit: 2015-07-20 | Discharge: 2015-07-20 | Disposition: A | Discharge status: Home / Self Care | Payer: 59 | Source: Ambulatory Visit | Attending: Radiation Oncology | Admitting: Radiation Oncology

## 2015-07-21 ENCOUNTER — Encounter: Payer: Self-pay | Admitting: Radiation Oncology

## 2015-07-21 ENCOUNTER — Ambulatory Visit
Admission: RE | Admit: 2015-07-21 | Discharge: 2015-07-21 | Disposition: A | Discharge status: Home / Self Care | Payer: 59 | Source: Ambulatory Visit | Attending: Radiation Oncology | Admitting: Radiation Oncology

## 2015-07-21 VITALS — BP 176/108 | HR 90 | Temp 98.0°F | Wt 234.5 lb

## 2015-07-21 DIAGNOSIS — C50212 Malignant neoplasm of upper-inner quadrant of left female breast: Secondary | ICD-10-CM

## 2015-07-21 NOTE — Progress Notes (Signed)
Weekly Management Note Current Dose:30.6 Gy  Projected Dose: 61 Gy   Narrative:  The patient presents for routine under treatment assessment.  CBCT/MVCT images/Port film x-rays were reviewed.  The chart was checked. Lots of anxiety related to upcoming medical appointments and looking for a job. Nausea and vomitting. Skin breaking down.   Physical Findings: Weight: 234 lb 8 oz (106.369 kg). Early dry desquamation in inframammary fold. Breast is pink.    Impression:  The patient is tolerating radiation.  Plan:  Continue treatment as planned. Continue radiaplex except to inframammary fold. Add neosporin if needed. Telfa for protection. Will ask navigators and chaplain to check in.   ------------------------------------------------  Thea Silversmith, MD

## 2015-07-21 NOTE — Progress Notes (Signed)
Ashlee Hill is here for her 18th fraction of radiation to her Left Breast. She is very anxious today about impending Porta Cath removal and Cataract surgery and her BP is very high at 176/108. She reports she had some vomiting during the night and this morning. She reports this does occur when she becomes very anxious. She also reports fatigue all the time, during the day and at night. Her Left anterior breast is red and she has some peeling under her breast that just started yesterday per her report. She is using the radiaplex cream as directed, but didn't start it until yesterday.   BP 176/108 mmHg  Pulse 90  Temp(Src) 98 F (36.7 C)  Wt 234 lb 8 oz (106.369 kg)

## 2015-07-22 ENCOUNTER — Ambulatory Visit
Admission: RE | Admit: 2015-07-22 | Discharge: 2015-07-22 | Disposition: A | Discharge status: Home / Self Care | Payer: 59 | Source: Ambulatory Visit | Attending: Radiation Oncology | Admitting: Radiation Oncology

## 2015-07-23 ENCOUNTER — Ambulatory Visit
Admission: RE | Admit: 2015-07-23 | Discharge: 2015-07-23 | Disposition: A | Discharge status: Home / Self Care | Payer: 59 | Source: Ambulatory Visit | Attending: Radiation Oncology | Admitting: Radiation Oncology

## 2015-07-24 ENCOUNTER — Ambulatory Visit
Admission: RE | Admit: 2015-07-24 | Discharge: 2015-07-24 | Disposition: A | Discharge status: Home / Self Care | Payer: 59 | Source: Ambulatory Visit | Attending: Radiation Oncology | Admitting: Radiation Oncology

## 2015-07-25 ENCOUNTER — Ambulatory Visit: Payer: 59

## 2015-07-27 ENCOUNTER — Ambulatory Visit: Payer: 59

## 2015-07-27 ENCOUNTER — Ambulatory Visit
Admission: RE | Admit: 2015-07-27 | Discharge: 2015-07-27 | Disposition: A | Discharge status: Home / Self Care | Payer: 59 | Source: Ambulatory Visit | Attending: Radiation Oncology | Admitting: Radiation Oncology

## 2015-07-28 ENCOUNTER — Ambulatory Visit
Admission: RE | Admit: 2015-07-28 | Discharge: 2015-07-28 | Disposition: A | Discharge status: Home / Self Care | Payer: 59 | Source: Ambulatory Visit | Attending: Radiation Oncology | Admitting: Radiation Oncology

## 2015-07-28 DIAGNOSIS — C50212 Malignant neoplasm of upper-inner quadrant of left female breast: Secondary | ICD-10-CM | POA: Insufficient documentation

## 2015-07-28 MED ORDER — PROMETHAZINE HCL 25 MG PO TABS
25.0000 mg | ORAL_TABLET | Freq: Once | ORAL | Status: AC
  Administered 2015-07-28: 25 mg via ORAL
  Filled 2015-07-28: qty 1

## 2015-07-28 NOTE — Progress Notes (Addendum)
Patient  In room 6, very sick/upset ,  Not treated as yet,, distressed ,wants to stop all treatments, declined vitals or assessment, offered ginger ale, water, refused 9:32 AM

## 2015-07-28 NOTE — Progress Notes (Signed)
Phenergan 25 mg po given as ordered by Dr. Pablo Ledger for nausea. She reports that she is nauseated daily and states "this is how I get with I am stressed".

## 2015-07-28 NOTE — Progress Notes (Signed)
Weekly Management Note Current Dose: 39.6 Gy  Projected Dose: 61 Gy   Narrative:  The patient presents for routine under treatment assessment.  CBCT/MVCT images/Port film x-rays were reviewed.  The chart was checked. Nausea and vomiting continues. "I took xanax but I threw it up. Ativan doesn't work for me. Zofram doesn't work for me."  Physical Findings: Weight:  . Early dry desquamation in inframammary fold. Breast is pink.  No moist desquamation.   Impression:  The patient is tolerating radiation.  Plan:  Continue treatment as planned. Continue radiaplex except to inframammary fold. Add neosporin if needed. Telfa for protection. Navigator in room. Pt given phenergan and agreed to proceed.   ------------------------------------------------  Thea Silversmith, MD

## 2015-07-29 ENCOUNTER — Ambulatory Visit
Admission: RE | Admit: 2015-07-29 | Discharge: 2015-07-29 | Disposition: A | Discharge status: Home / Self Care | Payer: 59 | Source: Ambulatory Visit | Attending: Radiation Oncology | Admitting: Radiation Oncology

## 2015-07-29 ENCOUNTER — Ambulatory Visit: Payer: 59

## 2015-07-30 ENCOUNTER — Ambulatory Visit: Payer: 59

## 2015-07-30 ENCOUNTER — Ambulatory Visit
Admission: RE | Admit: 2015-07-30 | Discharge: 2015-07-30 | Disposition: A | Discharge status: Home / Self Care | Payer: 59 | Source: Ambulatory Visit | Attending: Radiation Oncology | Admitting: Radiation Oncology

## 2015-07-31 ENCOUNTER — Ambulatory Visit
Admission: RE | Admit: 2015-07-31 | Discharge: 2015-07-31 | Disposition: A | Discharge status: Home / Self Care | Payer: 59 | Source: Ambulatory Visit | Attending: Radiation Oncology | Admitting: Radiation Oncology

## 2015-07-31 ENCOUNTER — Ambulatory Visit: Payer: 59

## 2015-08-03 ENCOUNTER — Ambulatory Visit: Payer: 59 | Admitting: Radiation Oncology

## 2015-08-03 ENCOUNTER — Ambulatory Visit
Admission: RE | Admit: 2015-08-03 | Discharge: 2015-08-03 | Disposition: A | Discharge status: Home / Self Care | Payer: 59 | Source: Ambulatory Visit | Attending: Radiation Oncology | Admitting: Radiation Oncology

## 2015-08-03 ENCOUNTER — Encounter: Payer: 59 | Admitting: Radiation Oncology

## 2015-08-04 ENCOUNTER — Encounter: Payer: Self-pay | Admitting: Radiation Oncology

## 2015-08-04 ENCOUNTER — Ambulatory Visit
Admission: RE | Admit: 2015-08-04 | Discharge: 2015-08-04 | Disposition: A | Discharge status: Home / Self Care | Payer: 59 | Source: Ambulatory Visit | Attending: Radiation Oncology | Admitting: Radiation Oncology

## 2015-08-04 ENCOUNTER — Telehealth: Payer: Self-pay | Admitting: *Deleted

## 2015-08-04 VITALS — BP 149/101 | HR 96 | Temp 98.3°F | Ht 65.0 in | Wt 235.9 lb

## 2015-08-04 DIAGNOSIS — C50212 Malignant neoplasm of upper-inner quadrant of left female breast: Secondary | ICD-10-CM

## 2015-08-04 NOTE — Progress Notes (Signed)
Weekly Management Note Current Dose: 51  Gy  Projected Dose: 61 Gy   Narrative:  The patient presents for routine under treatment assessment.  CBCT/MVCT images/Port film x-rays were reviewed.  The chart was checked. Better today. Cataract surgery this afternoon. Would like to move up med onc and survivorship appts due to insurance loss as of Dec 31. No breast pain.   Physical Findings: Weight: 235 lb 14.4 oz (107.004 kg). Breast is pink. No moist desquamation.   Impression:  The patient is tolerating radiation.  Plan:  Continue treatment as planned. Will discuss scheduling with survivorship and navigator. Continue radiaplex and neosporin as directed. Follow up in 1 month.

## 2015-08-04 NOTE — Telephone Encounter (Signed)
Left message for a return phone call to reschedule Dr. Ernestina Penna appt.

## 2015-08-04 NOTE — Progress Notes (Signed)
Ashlee Hill has received 28 fractions.  Appetite is good.  Reports that she fell on Sunday 08-02-15 no injury.  Skin to left breast with burned area and redness using Radiaplex and Neosporin ointment.  Energy level is low. BP 149/101 mmHg  Pulse 96  Temp(Src) 98.3 F (36.8 C) (Oral)  Ht 5\' 5"  (1.651 m)  Wt 235 lb 14.4 oz (107.004 kg)  BMI 39.26 kg/m2  SpO2 98%  Wt Readings from Last 3 Encounters:  08/04/15 235 lb 14.4 oz (107.004 kg)  07/21/15 234 lb 8 oz (106.369 kg)  07/14/15 236 lb 4.8 oz (107.185 kg)

## 2015-08-05 ENCOUNTER — Ambulatory Visit
Admission: RE | Admit: 2015-08-05 | Discharge: 2015-08-05 | Disposition: A | Discharge status: Home / Self Care | Payer: 59 | Source: Ambulatory Visit | Attending: Radiation Oncology | Admitting: Radiation Oncology

## 2015-08-05 ENCOUNTER — Ambulatory Visit: Payer: 59

## 2015-08-06 ENCOUNTER — Ambulatory Visit
Admission: RE | Admit: 2015-08-06 | Discharge: 2015-08-06 | Disposition: A | Discharge status: Home / Self Care | Payer: 59 | Source: Ambulatory Visit | Attending: Radiation Oncology | Admitting: Radiation Oncology

## 2015-08-06 ENCOUNTER — Encounter: Payer: Self-pay | Admitting: Hematology

## 2015-08-06 ENCOUNTER — Ambulatory Visit (HOSPITAL_BASED_OUTPATIENT_CLINIC_OR_DEPARTMENT_OTHER): Payer: 59 | Admitting: Hematology

## 2015-08-06 ENCOUNTER — Other Ambulatory Visit (HOSPITAL_BASED_OUTPATIENT_CLINIC_OR_DEPARTMENT_OTHER): Payer: 59

## 2015-08-06 VITALS — BP 128/83 | HR 99 | Temp 98.0°F | Resp 18 | Ht 65.0 in | Wt 237.0 lb

## 2015-08-06 DIAGNOSIS — E119 Type 2 diabetes mellitus without complications: Secondary | ICD-10-CM

## 2015-08-06 DIAGNOSIS — C50212 Malignant neoplasm of upper-inner quadrant of left female breast: Secondary | ICD-10-CM

## 2015-08-06 DIAGNOSIS — C773 Secondary and unspecified malignant neoplasm of axilla and upper limb lymph nodes: Secondary | ICD-10-CM

## 2015-08-06 DIAGNOSIS — Z171 Estrogen receptor negative status [ER-]: Secondary | ICD-10-CM

## 2015-08-06 LAB — CBC WITH DIFFERENTIAL/PLATELET
BASO%: 0.4 % (ref 0.0–2.0)
Basophils Absolute: 0 10*3/uL (ref 0.0–0.1)
EOS ABS: 0 10*3/uL (ref 0.0–0.5)
EOS%: 0.8 % (ref 0.0–7.0)
HCT: 40.8 % (ref 34.8–46.6)
HEMOGLOBIN: 14.1 g/dL (ref 11.6–15.9)
LYMPH%: 7.6 % — AB (ref 14.0–49.7)
MCH: 36.3 pg — AB (ref 25.1–34.0)
MCHC: 34.6 g/dL (ref 31.5–36.0)
MCV: 105.2 fL — AB (ref 79.5–101.0)
MONO#: 0.3 10*3/uL (ref 0.1–0.9)
MONO%: 6.8 % (ref 0.0–14.0)
NEUT%: 84.4 % — ABNORMAL HIGH (ref 38.4–76.8)
NEUTROS ABS: 4.1 10*3/uL (ref 1.5–6.5)
PLATELETS: 135 10*3/uL — AB (ref 145–400)
RBC: 3.88 10*6/uL (ref 3.70–5.45)
RDW: 13.8 % (ref 11.2–14.5)
WBC: 4.9 10*3/uL (ref 3.9–10.3)
lymph#: 0.4 10*3/uL — ABNORMAL LOW (ref 0.9–3.3)

## 2015-08-06 LAB — COMPREHENSIVE METABOLIC PANEL
ALBUMIN: 3.7 g/dL (ref 3.5–5.0)
ALK PHOS: 96 U/L (ref 40–150)
ALT: 43 U/L (ref 0–55)
ANION GAP: 11 meq/L (ref 3–11)
AST: 40 U/L — ABNORMAL HIGH (ref 5–34)
BILIRUBIN TOTAL: 1.2 mg/dL (ref 0.20–1.20)
BUN: 9 mg/dL (ref 7.0–26.0)
CO2: 21 mEq/L — ABNORMAL LOW (ref 22–29)
Calcium: 9.6 mg/dL (ref 8.4–10.4)
Chloride: 103 mEq/L (ref 98–109)
Creatinine: 1.2 mg/dL — ABNORMAL HIGH (ref 0.6–1.1)
EGFR: 52 mL/min/{1.73_m2} — AB (ref >90)
Glucose: 245 mg/dl — ABNORMAL HIGH (ref 70–140)
POTASSIUM: 4.2 meq/L (ref 3.5–5.1)
Sodium: 134 mEq/L — ABNORMAL LOW (ref 136–145)
TOTAL PROTEIN: 7.2 g/dL (ref 6.4–8.3)

## 2015-08-06 NOTE — Progress Notes (Signed)
[place]  [phone] [phone]  Clinic follow Up Note   Patient Care Team: Seward Carol, MD as PCP - General (Internal Medicine) Erroll Luna, MD as Consulting Physician (General Surgery) Truitt Merle, MD as Consulting Physician (Hematology) Thea Silversmith, MD as Consulting Physician (Radiation Oncology) Rockwell Germany, RN as Registered Nurse Mauro Kaufmann, RN as Registered Nurse 08/06/2015  CHIEF COMPLAINTS  Follow up breast cancer   Oncology History   Breast cancer of upper-inner quadrant of left female breast   Staging form: Breast, AJCC 7th Edition     Clinical stage from 10/08/2014: Stage IIB (T2, N1, M0) - Unsigned     Pathologic: Stage IIB (T2, N1, cM0) - Unsigned       Breast cancer of upper-inner quadrant of left female breast ([place])   09/26/2014 Breast US 2.5 cm irregular mass in the upper inner left breast with adjacent 1.1 cm satellite mass and enlarged left axillary lymph nodes, highly suspicious for left breast malignancy and left axillary lymph node metastases. Tissue sampling is recommended.    09/26/2014 Mammogram Spot compression views of the left breast and routine views of both breasts demonstrate a 2 x 2.5 cm irregular mass in the upper inner left breast.    09/29/2014 Initial Biopsy 1. Breast, left, needle core biopsy, mass, 11 o'clock - INVASIVE MAMMARY CARCINOMA, SEE COMMENT. - MAMMARY CARCINOMA IN SITU. 2. Lymph node, needle/core biopsy, left axillary - ONE LYMPH NODE, POSITIVE FOR METASTATIC MAMMARY CARCINOMA (1/1).   09/29/2014 Receptors her2 Estrogen Receptor: 0%, NEGATIVE Progesterone Receptor: 0%, NEGATIVE, HER2 negative,  Proliferation Marker Ki67: 93%   10/01/2014 Initial Diagnosis Breast cancer of upper-inner quadrant of left female breast   10/17/2014 Imaging CT and bone scan negative for distant metastasis    10/24/2014 - 03/18/2015 Adjuvant Chemotherapy ddACx4 then weekly Taxol X12   11/14/2014 Miscellaneous Genetic  testing BreastNext @ [facility] was negative except 2 variants with unknown significance: ATM p.[id number] and RAD51C p.I52L   04/01/2015 Imaging breast MRI showed complete resolution of the previously described left breast mass and left axillary adenopathy. No other new lesions.   05/01/2015 Surgery Left breast lumpectomy and sentinel lymph nodes biopsy at [facility] eye doctor Howard-McNatt. Margins were negative.   05/01/2015 Pathology Results Left breast lumpectomy showed no residual invasive carcinoma. 2 sentinel lymph nodes were negative.   06/24/2015 -  Radiation Therapy breast radiation with boost      HISTORY OF PRESENTING ILLNESS:  Ashlee Hill 54 y.o. female presents to our multidisciplinary breast clinic today to discuss the management of her newly diagnosed breast cancer  She noticed a left breast lump in August 2015, no tenderness, skin or nipple change. She otherwise felt well. She did not seek immediate medical attention due to lack of insurance. She finally got her insurance approved and saw her primary care physician recently. She was referred for mammogram which showed a 2.5 cm irregular mass in the upper inner left breast. She underwent left breast mass and axillary node biopsy on 09/29/2014 and both biopsy showed invasive ductal carcinoma, ER negative, PR negative, HER-2 negative.  She otherwise feels well, no pain or ther complains. She has good appetite and her weight is stable.  CURRENT THERAPY: breast radiation   INTERIM HISTORY; Ashlee Hill returns for follow-up. She is finishing breast radiation this week. She has had moderate radiation dermatitis, moderate fatigue, but otherwise tolerating well. She is looking forward to finish all planned breast cancer treatment, and will  start looking for a new job in January. She has been able to take care of herself and function well at home. Her hair has started growing back, it's still short, and partially gray.    MEDICAL  HISTORY:  Past Medical History  Diagnosis Date  . Breast cancer of upper-inner quadrant of left female breast ([place]) 10/01/2014  . Hypertension   . Family history of breast cancer   . Wears glasses     driving  . Diabetes mellitus without complication ([place]) 1/54/00    chemo caused diabetes per pt     SURGICAL HISTORY: Past Surgical History  Procedure Laterality Date  . Tonsillectomy    . Wisdom tooth extraction    . Portacath placement Right 10/21/2014    Procedure: INSERTION PORT-A-CATH;  Surgeon: Erroll Luna, MD;  Location: [place];  Service: General;  Laterality: Right;     GYN HISTORY  Menarchal: 11 LMP:  Contraceptive: no HRT: no  G0P0    SOCIAL HISTORY: Social History   Social History  . Marital Status: Single    Spouse Name: N/A  . Number of Children: N/A  . Years of Education: N/A   Occupational History  . Not on file.   Social History Main Topics  . Smoking status: Never Smoker   . Smokeless tobacco: Never Used  . Alcohol Use: 0.6 oz/week    1 Glasses of wine per week     Comment: socail drinker  . Drug Use: No  . Sexual Activity: Not on file   Other Topics Concern  . Not on file   Social History Narrative    FAMILY HISTORY: Family History  Problem Relation Age of Onset  . Prostate cancer Father 14    currently 74  . Breast cancer Paternal Aunt 70    deceased 79  . Lung cancer Maternal Grandfather 69    smoker; deceased  . Thyroid cancer Cousin 9    pat first cousin; currently 99     Father had prostate cancer at age of 73 Paternal aunt had breast caner in her 62's Paternal cousin had thyroid cancer at age of before 55 Maternal grandfather had lung cancer    ALLERGIES:  is allergic to tegaderm ag mesh.  MEDICATIONS:  Current Outpatient Prescriptions  Medication Sig Dispense Refill  . ALPRAZolam (XANAX) 1 MG tablet Take 1-2 mg by mouth 2 (two) times daily. Take 1 tablet (1 mg) in the morning & Take 2 tablets (2  mg) in the evening.    Marland Kitchen aspirin 81 MG tablet Take 81 mg by mouth daily.    Marland Kitchen atorvastatin (LIPITOR) 10 MG tablet Take 10 mg by mouth daily.    . B-D UF III MINI PEN NEEDLES 31G X 5 MM MISC   0  . calcium carbonate (OS-CAL) 600 MG TABS tablet Take 600 mg by mouth daily with breakfast.    . gabapentin (NEURONTIN) 600 MG tablet Take 600 mg by mouth at bedtime. As needed for sleep or anxiety    . hydrochlorothiazide (HYDRODIURIL) 25 MG tablet Take 25 mg by mouth daily.     Marland Kitchen HYDROcodone-acetaminophen (NORCO) 5-325 MG tablet Take 1 tablet by mouth every 6 (six) hours as needed.    . insulin lispro (HUMALOG) 100 UNIT/ML injection Inject into the skin 2 (two) times daily. Sliding scale >150-4 u, >200 8 units, >250-12 units, > 300 16 units,    . LANTUS SOLOSTAR 100 UNIT/ML Solostar Pen Inject 30 Units into the  skin at bedtime.   0  . metoprolol (LOPRESSOR) 100 MG tablet Take 100 mg by mouth daily.    . ONE TOUCH ULTRA TEST test strip TEST BLOOD SUGAR AS DIRECTED THREE TIMES A DAY  0  . ONETOUCH DELICA LANCETS 97W MISC use to STICK FINGER three times a day as directed  0  . venlafaxine XR (EFFEXOR-XR) 150 MG 24 hr capsule Take 150 mg by mouth daily with breakfast.    . zolpidem (AMBIEN) 10 MG tablet Take 10 mg by mouth at bedtime as needed for sleep.     No current facility-administered medications for this visit.    REVIEW OF SYSTEMS:   Constitutional: Denies fevers, chills or abnormal night sweats Eyes: Denies blurriness of vision, double vision or watery eyes Ears, nose, mouth, throat, and face: resolving mucositis and sore throat Respiratory: Denies cough, dyspnea or wheezes Cardiovascular: Denies palpitation, chest discomfort or lower extremity swelling Gastrointestinal:  Denies nausea, heartburn or change in bowel habits Skin: Denies abnormal skin rashes Lymphatics: Denies new lymphadenopathy or easy bruising Neurological:Denies numbness, tingling or new weaknesses Behavioral/Psych: Mood  is stable, no new changes  All other systems were reviewed with the patient and are negative.  PHYSICAL EXAMINATION: ECOG PERFORMANCE STATUS: 1  Filed Vitals:   08/06/15 1146  BP: 128/83  Pulse: 99  Temp: 98 F (36.7 C)  Resp: 18   Filed Weights   08/06/15 1146  Weight: 237 lb (107.502 kg)    GENERAL:alert, no distress and comfortable SKIN: skin color, texture, turgor are normal, no rashes or significant lesions. (+) Diffuse skin erythema and some pigmentation in the left breast, no blisters or skin ulcers.  EYES: normal, conjunctiva are pink and non-injected, sclera clear OROPHARYNX:no exudate, no erythema and lips, buccal mucosa, and tongue normal  NECK: supple, thyroid normal size, non-tender, without nodularity LYMPH:  no palpable lymphadenopathy in the cervical, axillary or inguinal LUNGS: clear to auscultation and percussion with normal breathing effort HEART: regular rate & rhythm and no murmurs and no lower extremity edema ABDOMEN:abdomen soft, non-tender and normal bowel sounds Musculoskeletal:no cyanosis of digits and no clubbing  PSYCH: alert & oriented x 3 with fluent speech NEURO: no focal motor/sensory deficits Breasts: Breast inspection showed them to be symmetrical with no nipple discharge. (+) Skin erythema of left breast, palpitation of both breasts and axilla reveals no palpable mass.  LABORATORY DATA:  I have reviewed the data as listed CBC Latest Ref Rng 08/06/2015 06/03/2015 04/03/2015  WBC 3.9 - 10.3 10e3/uL 4.9 5.9 4.5  Hemoglobin 11.6 - 15.9 g/dL 14.1 14.1 11.6  Hematocrit 34.8 - 46.6 % 40.8 41.4 34.5(Hill)  Platelets 145 - 400 10e3/uL 135(Hill) 161 197     Recent Labs  12/04/14 1134 12/04/14 1820 12/05/14 0520 12/06/14 0530  04/03/15 0918 06/03/15 1120 08/06/15 1024  NA 129*  --  138 138  < > 138 136 134*  K 3.6  --  3.3* 4.1  < > 4.6 4.9 4.2  CL 91*  --  103 106  --   --   --   --   CO2 25  --  27 24  < > 20* 24 21*  GLUCOSE 422*  --  227*  204*  < > 176* 167* 245*  BUN 10  --  7 <5*  < > 11.1 14.4 9.0  CREATININE 1.27*  --  1.03 0.79  < > 0.8 0.8 1.2*  CALCIUM 8.8  --  8.1* 7.7*  < >  9.1 9.5 9.6  GFRNONAA 48*  --  61* >90  --   --   --   --   GFRAA 55*  --  71* >90  --   --   --   --   PROT 7.5  --  5.9*  --   < > 6.6 7.4 7.2  ALBUMIN 4.5  --  3.3*  --   < > 3.5 4.0 3.7  AST 36  --  28  --   < > 20 40* 40*  ALT 28  --  22  --   < > 20 42 43  ALKPHOS 92  --  62  --   < > 101 113 96  BILITOT 1.3* 0.9 0.8  --   < > 0.46 0.71 1.20  BILIDIR  --  0.2  --   --   --   --   --   --   IBILI  --  0.7  --   --   --   --   --   --   < > = values in this interval not displayed.  PATHOLOGY REPORT 09/26/2014 Diagnosis 1. Breast, left, needle core biopsy, mass, 11 o'clock - INVASIVE MAMMARY CARCINOMA, SEE COMMENT. - MAMMARY CARCINOMA IN SITU. 2. Lymph node, needle/core biopsy, left axillary - ONE LYMPH NODE, POSITIVE FOR METASTATIC MAMMARY CARCINOMA (1/1). Microscopic Comment 1. Although grade of tumor is best assessed at resection, with these biopsies, both the in situ and invasive carcinoma are grade II. The invasive carcinoma demonstrates strong diffuse E-cadherin expression; supporting a ductal phenotype. 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  Estrogen Receptor: 0%, NEGATIVE Progesterone Receptor: 0%, NEGATIVE Proliferation Marker Ki67: 93% 1. A sample was sent to [facility] for HER-2 testing by FISH. The results are as follows: Negative.   ACCESSION NUMBER:[id number]  RECEIVED: 05/01/2015  ORDERING PHYSICIAN:MARISSA HOWARD-MCNATT , MD  PATIENT NAME:Ashlee Hill, Ashlee Hill  SURGICAL PATHOLOGY REPORT    FINAL PATHOLOGIC DIAGNOSIS  MICROSCOPIC EXAMINATION AND DIAGNOSIS    A.LEFT AXILLA SENTINEL LYMPH NODE #1, EXCISION:  No carcinoma identified in one lymph node  (0/1).    B.LEFT AXILLA SENTINEL LYMPH NODE #2, EXCISION:  No carcinoma identified in one lymph node (0/1).    C.LEFT BREAST, LUMPECTOMY:  No residual invasive carcinoma identified.  Fibrosis with chronic inflammation and foreign body giant cell  reaction at previous core needle biopsy site.  Fibrocystic changes.  See comment.    D.LEFT BREAST, DEEP MARGIN, EXCISION:  Benign breast adipose tissue.  Negative for malignancy.   RADIOGRAPHIC STUDIES: I have personally reviewed the radiological images as listed and agreed with the findings in the report.  No new scans    ASSESSMENT & PLAN:  54 year old pre-menopausal female with past medical history of hypertension, presented with a palpable left breast mass.  1. Left breast invasive ductal carcinoma, cT2N1M0, stage IIB, triple negative, ypT0N0 -I reviewed her surgical pathology findings, which showed complete pathological response. 2 sentinel lymph nodes were negative. -We discussed that complete pathology response predicts good outcome, although she still has small risk of cancer recurrence after surgery. -I do not recommend any additional adjuvant chemotherapy -She will not benefit from antiestrogen therapy given the negative ER/PR on her tumor cells  -She is finishing breast radiation this week. -We discussed breast cancer surveillance, with annual mammogram, physical exam and lab every 3-4 months for the  first 2 years, and every 6 months for additional 3 years. -Due to out of of pocket cost, she prefers to follow-up with her primary care physician for her breast cancer surveillance. She knows to call us if she has any questions or concerns. -I encouraged her to have healthy diet, exercise regularly, and try to lose some weight. -Due to the cost issue, she will not be seen at our survivorship clinic formally. Our survivorship nurse practitioner Nira Conn met her briefly  today, and will mail her the care plan.   2. Genetics  --Given her young age and triple negative disease, positive family history, she was referred to see genetic counselor for genetic testing, her genetic testing was negative.  3.Anxiety and coping -Better, she still takes Xanax as needed  4. Newly diagnosed type 2 diabetes -Continue Lantus and insulin sliding scale, follow-up with her primary care physician.   Plan -due to cost issue, she will follow up with her PCP Dr. Delfina Redwood for her breast cancer surveillance. -I'll only see her as needed in future.   All questions were answered. The patient knows to call the clinic with any problems, questions or concerns. I spent 20 minutes counseling the patient face to face. The total time spent in the appointment was 25 minutes and more than 50% was on counseling.     Truitt Merle, MD 08/06/2015 9:00 PM

## 2015-08-07 ENCOUNTER — Ambulatory Visit: Payer: 59

## 2015-08-07 ENCOUNTER — Ambulatory Visit
Admission: RE | Admit: 2015-08-07 | Discharge: 2015-08-07 | Disposition: A | Discharge status: Home / Self Care | Payer: 59 | Source: Ambulatory Visit | Attending: Radiation Oncology | Admitting: Radiation Oncology

## 2015-08-10 ENCOUNTER — Ambulatory Visit
Admission: RE | Admit: 2015-08-10 | Discharge: 2015-08-10 | Disposition: A | Discharge status: Home / Self Care | Payer: 59 | Source: Ambulatory Visit | Attending: Radiation Oncology | Admitting: Radiation Oncology

## 2015-08-10 ENCOUNTER — Ambulatory Visit: Payer: 59

## 2015-08-11 ENCOUNTER — Ambulatory Visit
Admission: RE | Admit: 2015-08-11 | Discharge: 2015-08-11 | Disposition: A | Discharge status: Home / Self Care | Payer: 59 | Source: Ambulatory Visit | Attending: Radiation Oncology | Admitting: Radiation Oncology

## 2015-08-11 ENCOUNTER — Encounter: Payer: Self-pay | Admitting: Radiation Oncology

## 2015-08-11 ENCOUNTER — Encounter: Payer: Self-pay | Admitting: *Deleted

## 2015-08-11 VITALS — BP 139/84 | HR 108 | Temp 97.7°F | Resp 20 | Wt 234.3 lb

## 2015-08-11 DIAGNOSIS — C50212 Malignant neoplasm of upper-inner quadrant of left female breast: Secondary | ICD-10-CM

## 2015-08-11 HISTORY — PX: OTHER SURGICAL HISTORY: SHX169

## 2015-08-11 NOTE — Progress Notes (Signed)
Weekly  Left breast   33/33 complete, dry desquamation on mid chest breast and under inframmary fold has peeled and is healing, hematoma on left breast again, sees Her Surgeon next week Dr. Elisha Headland at [facility] for port removal and willccheck that,  Using radiaplex  Bid, gave [facility] , Live strong flyers and discussed suvivorship ,  Patient going to have cataract surgery today left eye 8:37 AM BP 139/84 mmHg  Pulse 108  Temp(Src) 97.7 F (36.5 C) (Oral)  Resp 20  Wt 234 lb 4.8 oz (106.278 kg)  Wt Readings from Last 3 Encounters:  08/11/15 234 lb 4.8 oz (106.278 kg)  08/06/15 237 lb (107.502 kg)  08/04/15 235 lb 14.4 oz (107.004 kg)

## 2015-08-11 NOTE — Progress Notes (Signed)
Weekly Management Note:  Site: left breast Current Dose:   6100  cGy Projected Dose:  6100  cGy  Narrative: The patient is seen today for routine under treatment assessment. CBCT/MVCT images/port films were reviewed. The chart was reviewed.    She finishes her radiation therapy today. She does have left breast discomfort as expected.  She uses Radioplex gel.  She is scheduled for cataract surgery of her left eye today.  Physical Examination:  Filed Vitals:   08/11/15 0830  BP: 139/84  Pulse: 108  Temp: 97.7 F (36.5 C)  Resp: 20  .  Weight: 234 lb 4.8 oz (106.278 kg).  There is diffuse erythema along the left breast with  Almost complete reepithelialization of her recent moist desquamation along the inframammary region.  Impression:   Satisfactory progress with the expected degree of radiation dermatitis.  Radiation therapy completed. Plan:  Follow-up visit with Dr. Pablo Ledger in one month.

## 2015-08-18 HISTORY — PX: OTHER SURGICAL HISTORY: SHX169

## 2015-08-18 NOTE — Progress Notes (Signed)
Name: Ashlee Hill   MRN: [id number]  Date:  07/29/15  DOB: [birth year]  Status:outpatient    DIAGNOSIS: Breast cancer of upper-inner quadrant of left female breast [facility])   Staging form: Breast, AJCC 7th Edition     Clinical stage from 10/08/2014: Stage IIB (T2, N1, M0) - Unsigned     Pathologic stage from 05/01/2015: yT0, N0, cM0 - Signed by Truitt Merle, MD on 05/21/2015     Pathologic: No stage assigned - Unsigned   CONSENT VERIFIED: yes   SET UP: Patient is setup supine   IMMOBILIZATION:  The following immobilization was used:Custom Moldable Pillow, breast board.   NARRATIVE: Ashlee Hill underwent complex simulation and treatment planning for her boost treatment today.  Her tumor volume was outlined on the planning CT scan.  Due to the depth of her cavity, electrons could not be used and a photon plan was developed. The plan will be prescribed to the100%  isodose line.   I personally supervised and approved the creation of 3 unique MLCs comprising 3   treatment devices.

## 2015-08-18 NOTE — Progress Notes (Signed)
  Radiation Oncology         [phone]) [phone] ________________________________  Name: Ashlee Hill MRN: [id number]  Date: 08/11/2015  DOB: 03-Aug-1961  End of Treatment Note  Diagnosis:  Breast cancer of upper-inner quadrant of left female breast [facility])   Staging form: Breast, AJCC 7th Edition     Clinical stage from 10/08/2014: Stage IIB (T2, N1, M0) - Unsigned     Pathologic stage from 05/01/2015: yT0, N0, cM0 - Signed by Truitt Merle, MD on 05/21/2015     Pathologic: No stage assigned - Unsigned     Indication for treatment:  Curative      Radiation treatment dates:   06/25/15-08/11/15  Site/dose:   Left breast/ 45 Gy at 1.8 Gy per fraction x 25 fractions.  Left supraclavicular fossa and axilla/ 45 Gy at 1.8 Gy per fraction x 25 fractions Left breast boost/ 16 Gy at 2 Gy per fraction x 8 fractions  Beams/energy:  Opposed tangents with reduced fields and breath hold technique / 10 and 15 MV photons RAO/LPO6 and 10 MV photons Three field with 10 and 6 MV photons  Narrative: The patient tolerated radiation treatment relatively well.   She had tremendous anxiety during treatment and was followed closely by our breast cancer navigators.  She had moist desquamation in the inframammary fold which had almost healed at the end of her treatment.   Plan: The patient has completed radiation treatment. The patient will return to radiation oncology clinic for routine followup in one month. I advised them to call or return sooner if they have any questions or concerns related to their recovery or treatment.  ------------------------------------------------  Thea Silversmith, MD

## 2015-08-25 ENCOUNTER — Ambulatory Visit: Payer: 59 | Admitting: Hematology

## 2015-08-25 ENCOUNTER — Other Ambulatory Visit: Payer: 59

## 2015-08-31 ENCOUNTER — Encounter: Payer: Self-pay | Admitting: General Practice

## 2015-08-31 NOTE — Progress Notes (Signed)
Spiritual Care Note  Spoke with Ashlee Hill by phone to provide emotional support as she adjusts to post-treatment life.  Per pt, she is struggling with the lack of structure/focus in daily life and high distress about finding a job.  (Per pt, typical job-search distress is increased by her felt urgency to "stop depending on my parents financially," as well as "confidence issues" and questions about how to address ca/tx hx with prospective employers.)  Ashlee Hill is aware of support programs like [facility] (Finding Your New Normal), designed to help pts with this transition; per pt, she prefers to cope on her own (private person, concerned that others' distress would introduce more anxieties).  Used existence of [employer] as a tool to normalize the feelings and struggles she is experiencing.  Provided opportunity for her to share and process the conclusion of treatment and coping tools for looking ahead, reminding her of ongoing availability for 1:1 support, especially if more comfortable than a group setting.  Ashlee Hill verbalized gratitude for support, care, and encouragement along touchstone moments of her cancer experience.  [place], [place], [facility] Pager [phone] Voicemail  [phone]

## 2015-09-17 ENCOUNTER — Ambulatory Visit
Admission: RE | Admit: 2015-09-17 | Discharge: 2015-09-17 | Disposition: A | Discharge status: Home / Self Care | Payer: 59 | Source: Ambulatory Visit | Attending: Radiation Oncology | Admitting: Radiation Oncology

## 2015-09-17 ENCOUNTER — Encounter: Payer: Self-pay | Admitting: Radiation Oncology

## 2015-09-17 VITALS — BP 157/109 | HR 96 | Temp 98.6°F | Ht 65.0 in | Wt 234.2 lb

## 2015-09-17 DIAGNOSIS — C50212 Malignant neoplasm of upper-inner quadrant of left female breast: Secondary | ICD-10-CM

## 2015-09-17 HISTORY — DX: Malignant neoplasm of unspecified site of unspecified female breast: C50.919

## 2015-09-17 NOTE — Progress Notes (Addendum)
Skin status: Slight wheer the hematoma is located on the right breast Lotion being used: Radiaplx daily Have you seen your medical oncologist? No  appointment has been made.  "Patient stated that she has no insurance". ER+,have started AI or Tamoxifen? If not, why? ER- No appointment has been made by Dr. Ernestina Penna office yet. Discuss survivorship appointment:  Discusses at EOT 09-10-14 Information was given Offer referral to [employer] 09-10-14 Information was given Appetite:Good Pain:No Energy level:Good    BP 157/109 mmHg  Pulse 96  Temp(Src) 98.6 F (37 C) (Oral)  Ht 5\' 5"  (1.651 m)  Wt 234 lb 3.2 oz (106.232 kg)  BMI 38.97 kg/m2  SpO2 96% Wt Readings from Last 3 Encounters:  09/17/15 234 lb 3.2 oz (106.232 kg)  08/11/15 234 lb 4.8 oz (106.278 kg)  08/06/15 237 lb (107.502 kg)

## 2015-09-17 NOTE — Progress Notes (Signed)
   Department of Radiation Oncology  Phone:  [phone] Fax:        [phone]   Name: Ashlee Hill MRN: [id number]  DOB: 09/16/1960  Date: 09/17/2015  Follow Up Visit Note  Diagnosis: Breast cancer of upper-inner quadrant of left female breast [facility])   Staging form: Breast, AJCC 7th Edition     Clinical stage from 10/08/2014: Stage IIB (T2, N1, M0) - Unsigned     Pathologic stage from 05/01/2015: yT0, N0, cM0 - Signed by Truitt Merle, MD on 05/21/2015     Pathologic: No stage assigned - Unsigned  Summary and Interval since last radiation: 5 weeks  06/25/15-08/11/15 Site/dose:   Left breast/ 45 Gy at 1.8 Gy per fraction x 25 fractions.  Left supraclavicular fossa and axilla/ 45 Gy at 1.8 Gy per fraction x 25 fractions Left breast boost/ 16 Gy at 2 Gy per fraction x 8 fractions  Interval History: Ashlee Hill presents today for routine followup. She is in the process of looking for a job. She reports a hematoma in the right breast. She is using Radiaplex bid. She is not on anti estrogen therapy.  She is adjusting to her new lens'. She is depressed and having difficulty finding a job.   Physical Exam:  Filed Vitals:   09/17/15 1606  BP: 157/109  Pulse: 96  Temp: 98.6 F (37 C)  TempSrc: Oral  Height: 5\' 5"  (1.651 m)  Weight: 234 lb 3.2 oz (106.232 kg)  SpO2: 96%  Skin is well healed. She does have some dry skin over the medial aspect of the left breast and a palpable and visible hematoma over her left breast superior to her scar.  IMPRESSION: Ashlee Hill is a 55 y.o. female with stage IIB triple negative left breast cancer resolving from acute effects of radiation.  PLAN: She is doing well. We discussed the need for follow up every 4-6 months which she has scheduled.  We discussed the need for yearly mammograms which she can schedule with her OBGYN or with medical oncology. We discussed the need for sun protection in the treated area.  She can always call me with questions.  I  will follow up with her in 6 months. We will call her with that appointment.  She will undergo a mammogram in March at [facility] which she is awaiting scheduling.   Ashlee Silversmith, MD  This document serves as a record of services personally performed by Ashlee Silversmith, MD. It was created on her behalf by Darcus Austin, a trained medical scribe. The creation of this record is based on the scribe's personal observations and the provider's statements to them. This document has been checked and approved by the attending provider.

## 2015-11-12 ENCOUNTER — Encounter: Payer: Self-pay | Admitting: Nurse Practitioner

## 2015-11-12 DIAGNOSIS — C50212 Malignant neoplasm of upper-inner quadrant of left female breast: Secondary | ICD-10-CM

## 2015-11-12 NOTE — Progress Notes (Signed)
The Survivorship Care Plan was mailed to Ashlee Hill as she reported not being able to come in to the Survivorship Clinic for an in-person visit at this time. A letter was mailed to her outlining the purpose of the content of the care plan, as well as encouraging her to reach out to me with any questions or concerns.  My business card was included in the correspondence to the patient as well.  A copy of the care plan was also routed/faxed/mailed to Ashlee Hams, MD, the patient's PCP.  I will not be placing any follow-up appointments to the Survivorship Clinic for Ashlee Hill, but I am happy to see her at any time in the future for any survivorship concerns that may arise. Thank you for allowing me to participate in her care!  Kenn File, [place] [phone]

## 2016-01-21 ENCOUNTER — Other Ambulatory Visit: Payer: Self-pay | Admitting: *Deleted

## 2016-01-21 ENCOUNTER — Encounter: Payer: Self-pay | Admitting: *Deleted

## 2016-01-22 ENCOUNTER — Telehealth: Payer: Self-pay | Admitting: Student in an Organized Health Care Education/Training Program

## 2016-01-22 NOTE — Telephone Encounter (Signed)
lvm for pt regarding to nov appt

## 2016-03-03 ENCOUNTER — Ambulatory Visit: Payer: Self-pay | Admitting: Radiation Oncology

## 2016-03-10 ENCOUNTER — Ambulatory Visit: Payer: Self-pay | Admitting: Radiation Oncology

## 2016-03-31 ENCOUNTER — Ambulatory Visit: Payer: Self-pay | Admitting: Radiation Oncology

## 2016-04-21 ENCOUNTER — Ambulatory Visit: Payer: Self-pay | Admitting: Radiation Oncology

## 2016-05-31 ENCOUNTER — Telehealth: Payer: Self-pay | Admitting: *Deleted

## 2016-05-31 NOTE — Telephone Encounter (Signed)
"  I need to know when my appointment is?"  Next scheduled F/U 06-23-2016 at 2:00 pm.  Does she have anything later.  I work until 4:30 pm and can't leave work that early.  I can leave work an hour and a half early."  Will notify provider to determine appointment availability for scheduler.

## 2016-05-31 NOTE — Telephone Encounter (Signed)
I will get her in, I can see her at 4pm, but she needs to be here before 4pm for lab. I will send a message to scheduling.   Truitt Merle MD

## 2016-06-02 ENCOUNTER — Telehealth: Payer: Self-pay | Admitting: Hematology

## 2016-06-02 NOTE — Telephone Encounter (Signed)
lvm to inform pt of r/s appt to 11/6 at 2 pm per [facility] ([facility])

## 2016-06-23 ENCOUNTER — Ambulatory Visit: Payer: Self-pay | Admitting: Hematology

## 2016-06-26 DIAGNOSIS — E1169 Type 2 diabetes mellitus with other specified complication: Secondary | ICD-10-CM | POA: Insufficient documentation

## 2016-06-26 DIAGNOSIS — E669 Obesity, unspecified: Secondary | ICD-10-CM

## 2016-06-26 NOTE — Progress Notes (Signed)
Ashlee Hill  Telephone:([phone]) [phone] Fax:([phone]) [phone]  Clinic follow Up Note   Patient Care Team: Seward Carol, MD as PCP - General (Internal Medicine) Erroll Luna, MD as Consulting Physician (General Surgery) Truitt Merle, MD as Consulting Physician (Hematology) Thea Silversmith, MD as Consulting Physician (Radiation Oncology) Rockwell Germany, RN as Registered Nurse Mauro Kaufmann, RN as Registered Nurse 06/27/2016  CHIEF COMPLAINTS  Follow up breast cancer   Oncology History   Breast cancer of upper-inner quadrant of left female breast   Staging form: Breast, AJCC 7th Edition     Clinical stage from 10/08/2014: Stage IIB (T2, N1, M0) - Unsigned     Pathologic: Stage IIB (T2, N1, cM0) - Unsigned       Breast cancer of upper-inner quadrant of left female breast ([place])   09/26/2014 Mammogram    Spot compression views of the left breast and routine views of both breasts demonstrate a 2 x 2.5 cm irregular mass in the upper inner left breast.       09/26/2014 Breast US    Left breast: 2.5 cm irregular mass in the upper inner with adjacent 1.1 cm satellite mass and enlarged left axillary lymph nodes, highly suspicious for left breast malignancy and left axillary lymph node metastases. Tissue sampling is recommended.       09/29/2014 Initial Biopsy    1. Breast, left, needle core biopsy, mass, 11 o'clock:- INVASIVE MAMMARY CARCINOMA, SEE COMMENT. - MAMMARY CARCINOMA IN SITU. 2. Lymph node, needle/core biopsy, left axillary - ONE LYMPH NODE, POSITIVE FOR METASTATIC MAMMARY CARCINOMA (1/1).      09/29/2014 Receptors her2    Estrogen Receptor: 0%, NEGATIVE Progesterone Receptor: 0%, NEGATIVE, HER2 negative,  Proliferation Marker Ki67: 93%      10/08/2014 Clinical Stage    Stage IIB: t2 N1      10/17/2014 Imaging    CT and bone scan negative for distant metastasis       10/24/2014 - 03/18/2015 Adjuvant Chemotherapy    ddACx4 then weekly Taxol X12      11/14/2014  Miscellaneous    Genetic testing BreastNext @ [facility] 2 variants with unknown significance: ATM p.[id number] and RAD51C p.I52L; otherwise neg at ATM, BARD1, BRCA1, BRCA2, BRIP1, CDH1, CHEK2, MRE11A, MUTYH, NBN, NF1, PALB2, PTEN, RAD50, RAD51C, RAD51D, and TP53      04/01/2015 Breast MRI    Complete resolution of the previously described left breast mass and left axillary adenopathy. No other new lesions.      05/01/2015 Surgery    Left breast lumpectomy and sentinel lymph nodes biopsy at [facility]  No residual malignancy; margins were negative. 2 LN removed and negative.      05/01/2015 Pathologic Stage    Stage IIB: T2 N1      06/24/2015 - 08/11/2015 Radiation Therapy    Adjuvant RT: Left breast/ 45 Gy at 1.8 Gy per fraction x 25 fractions.  Left supraclavicular fossa and axilla/ 45 Gy at 1.8 Gy per fraction x 25 fractions Left breast boost/ 16 Gy at 2 Gy per fraction x 8 fractions        HISTORY OF PRESENTING ILLNESS:  Ashlee Hill 55 y.o. female presents to our multidisciplinary breast clinic today to discuss the management of her newly diagnosed breast cancer  She noticed a left breast lump in August 2015, no tenderness, skin or nipple change. She otherwise felt well. She did not seek immediate medical attention due to lack of insurance. She finally  got her insurance approved and saw her primary care physician recently. She was referred for mammogram which showed a 2.5 cm irregular mass in the upper inner left breast. She underwent left breast mass and axillary node biopsy on 09/29/2014 and both biopsy showed invasive ductal carcinoma, ER negative, PR negative, HER-2 negative.  She otherwise feels well, no pain or ther complains. She has good appetite and her weight is stable.  CURRENT THERAPY: Surveillance  INTERIM HISTORY; Ashlee Hill returns for follow-up. She was last seen by me in December 2016, and did not follow up afterwards due to lack of insurance. She  now has insurance again through her work, and returns for follow-up. She did see her surgeon at [facility] for follow-up in May 2017, and screening mammogram was negative. She developed a hematoma at the surgical site after surgery last year, which was aspirated once, and still has a residual lump, with mild tenderness. She otherwise feels well, her anxiety has improved overall, she has found a new job, and tolerating well. She does have mild to moderate fatigue, especially at the end of the day after work. She is not physically very active, does not exercise. She has gained 7 pounds since last visit.  MEDICAL HISTORY:  Past Medical History:  Diagnosis Date  . Breast cancer ([place])   . Breast cancer of upper-inner quadrant of left female breast ([place]) 10/01/2014  . Diabetes mellitus without complication ([place]) 8/92/11   chemo caused diabetes per pt   . Family history of breast cancer   . Hypertension   . Wears glasses    driving    SURGICAL HISTORY: Past Surgical History:  Procedure Laterality Date  . Catarct Extracts Bilateral  08-11-15   Right Eye  . Left Eye Cayract Extraction  08-18-15   Left Eye  . Port-A-Cath Removed  08-18-15  . PORTACATH PLACEMENT Right 10/21/2014   Procedure: INSERTION PORT-A-CATH;  Surgeon: Erroll Luna, MD;  Location: [facility];  Service: General;  Laterality: Right;  . TONSILLECTOMY    . WISDOM TOOTH EXTRACTION       GYN HISTORY  Menarchal: 11 LMP:  Contraceptive: no HRT: no  G0P0    SOCIAL HISTORY: Social History   Social History  . Marital status: Single    Spouse name: N/A  . Number of children: N/A  . Years of education: N/A   Occupational History  . Not on file.   Social History Main Topics  . Smoking status: Never Smoker  . Smokeless tobacco: Never Used  . Alcohol use 0.6 oz/week    1 Glasses of wine per week     Comment: socail drinker  . Drug use: No  . Sexual activity: Not on file   Other Topics Concern  . Not  on file   Social History Narrative  . No narrative on file    FAMILY HISTORY: Family History  Problem Relation Age of Onset  . Prostate cancer Father 37    currently 35  . Breast cancer Paternal Aunt 34    deceased 80  . Lung cancer Maternal Grandfather 80    smoker; deceased  . Thyroid cancer Cousin 62    pat first cousin; currently 54     Father had prostate cancer at age of 51 Paternal aunt had breast caner in her 22's Paternal cousin had thyroid cancer at age of before 52 Maternal grandfather had lung cancer    ALLERGIES:  is allergic to tegaderm ag mesh [silver].  MEDICATIONS:  Current Outpatient Prescriptions  Medication Sig Dispense Refill  . ALPRAZolam (XANAX) 1 MG tablet Take 1-2 mg by mouth 2 (two) times daily. Take 1 tablet (1 mg) in the morning & Take 2 tablets (2 mg) in the evening.    Marland Kitchen aspirin 81 MG tablet Take 81 mg by mouth daily. Reported on 08/06/2015    . atorvastatin (LIPITOR) 10 MG tablet Take 10 mg by mouth daily.    . B-D UF III MINI PEN NEEDLES 31G X 5 MM MISC   0  . calcium carbonate (OS-CAL) 600 MG TABS tablet Take 600 mg by mouth daily with breakfast.    . gabapentin (NEURONTIN) 600 MG tablet Take 600 mg by mouth at bedtime. As needed for sleep or anxiety    . hydrochlorothiazide (HYDRODIURIL) 25 MG tablet Take 25 mg by mouth daily.     . insulin lispro (HUMALOG) 100 UNIT/ML injection Inject into the skin 2 (two) times daily. Sliding scale >150-4 u, >200 8 units, >250-12 units, > 300 16 units,    . LANTUS SOLOSTAR 100 UNIT/ML Solostar Pen Inject 30 Units into the skin at bedtime.   0  . metoprolol (LOPRESSOR) 100 MG tablet Take 100 mg by mouth daily.    . ONE TOUCH ULTRA TEST test strip TEST BLOOD SUGAR AS DIRECTED THREE TIMES A DAY  0  . ONETOUCH DELICA LANCETS 33G MISC use to STICK FINGER three times a day as directed  0  . venlafaxine XR (EFFEXOR-XR) 150 MG 24 hr capsule Take 150 mg by mouth daily with breakfast.    . zolpidem (AMBIEN) 10 MG  tablet Take 10 mg by mouth at bedtime as needed for sleep. Reported on 09/17/2015     No current facility-administered medications for this visit.     REVIEW OF SYSTEMS:   Constitutional: Denies fevers, chills or abnormal night sweats Eyes: Denies blurriness of vision, double vision or watery eyes Ears, nose, mouth, throat, and face: resolving mucositis and sore throat Respiratory: Denies cough, dyspnea or wheezes Cardiovascular: Denies palpitation, chest discomfort or lower extremity swelling Gastrointestinal:  Denies nausea, heartburn or change in bowel habits Skin: Denies abnormal skin rashes Lymphatics: Denies new lymphadenopathy or easy bruising Neurological:Denies numbness, tingling or new weaknesses Behavioral/Psych: Mood is stable, no new changes  All other systems were reviewed with the patient and are negative.  PHYSICAL EXAMINATION: ECOG PERFORMANCE STATUS: 1  Vitals:   06/27/16 1500  BP: (!) 156/85  Pulse: 89  Resp: 18  Temp: 98.6 F (37 C)   Filed Weights   06/27/16 1500  Weight: 244 lb 11.2 oz (111 kg)    GENERAL:alert, no distress and comfortable SKIN: skin color, texture, turgor are normal, no rashes or significant lesions. (+) Diffuse skin erythema and some pigmentation in the left breast, no blisters or skin ulcers.  EYES: normal, conjunctiva are pink and non-injected, sclera clear OROPHARYNX:no exudate, no erythema and lips, buccal mucosa, and tongue normal  NECK: supple, thyroid normal size, non-tender, without nodularity LYMPH:  no palpable lymphadenopathy in the cervical, axillary or inguinal LUNGS: clear to auscultation and percussion with normal breathing effort HEART: regular rate & rhythm and no murmurs and no lower extremity edema ABDOMEN:abdomen soft, non-tender and normal bowel sounds Musculoskeletal:no cyanosis of digits and no clubbing  PSYCH: alert & oriented x 3 with fluent speech NEURO: no focal motor/sensory deficits Breasts: Breast  inspection showed them to be symmetrical with no nipple discharge. There is a 4 x 5 cm lump at  the left breasts surgical incision, likely the residual hematoma/scar, no other palpable breast mass or axillary adenopathy.  LABORATORY DATA:  I have reviewed the data as listed CBC Latest Ref Rng & Units 08/06/2015 06/03/2015 04/03/2015  WBC 3.9 - 10.3 10e3/uL 4.9 5.9 4.5  Hemoglobin 11.6 - 15.9 g/dL 14.1 14.1 11.6  Hematocrit 34.8 - 46.6 % 40.8 41.4 34.5(Hill)  Platelets 145 - 400 10e3/uL 135(Hill) 161 197   CMP Latest Ref Rng & Units 08/06/2015 06/03/2015 04/03/2015  Glucose 70 - 140 mg/dl 245(H) 167(H) 176(H)  BUN 7.0 - 26.0 mg/dL 9.0 14.4 11.1  Creatinine 0.6 - 1.1 mg/dL 1.2(H) 0.8 0.8  Sodium 136 - 145 mEq/Hill 134(Hill) 136 138  Potassium 3.5 - 5.1 mEq/Hill 4.2 4.9 4.6  Chloride 96 - 112 mmol/Hill - - -  CO2 22 - 29 mEq/Hill 21(Hill) 24 20(Hill)  Calcium 8.4 - 10.4 mg/dL 9.6 9.5 9.1  Total Protein 6.4 - 8.3 g/dL 7.2 7.4 6.6  Total Bilirubin 0.20 - 1.20 mg/dL 1.20 0.71 0.46  Alkaline Phos 40 - 150 U/Hill 96 113 101  AST 5 - 34 U/Hill 40(H) 40(H) 20  ALT 0 - 55 U/Hill 43 42 20     PATHOLOGY REPORT 09/26/2014 Diagnosis 1. Breast, left, needle core biopsy, mass, 11 o'clock - INVASIVE MAMMARY CARCINOMA, SEE COMMENT. - MAMMARY CARCINOMA IN SITU. 2. Lymph node, needle/core biopsy, left axillary - ONE LYMPH NODE, POSITIVE FOR METASTATIC MAMMARY CARCINOMA (1/1). Microscopic Comment 1. Although grade of tumor is best assessed at resection, with these biopsies, both the in situ and invasive carcinoma are grade II. The invasive carcinoma demonstrates strong diffuse E-cadherin expression; supporting a ductal phenotype. 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  Estrogen Receptor: 0%, NEGATIVE Progesterone Receptor: 0%, NEGATIVE Proliferation Marker Ki67: 93% 1. A sample was sent to  [facility] for HER-2 testing by FISH. The results are as follows: Negative.   ACCESSION NUMBER:[id number]  RECEIVED: 05/01/2015  ORDERING PHYSICIAN:MARISSA HOWARD-MCNATT , MD  PATIENT NAME:Ashlee Hill, Ashlee Hill  SURGICAL PATHOLOGY REPORT    FINAL PATHOLOGIC DIAGNOSIS  MICROSCOPIC EXAMINATION AND DIAGNOSIS    A.LEFT AXILLA SENTINEL LYMPH NODE #1, EXCISION:  No carcinoma identified in one lymph node (0/1).    B.LEFT AXILLA SENTINEL LYMPH NODE #2, EXCISION:  No carcinoma identified in one lymph node (0/1).    C.LEFT BREAST, LUMPECTOMY:  No residual invasive carcinoma identified.  Fibrosis with chronic inflammation and foreign body giant cell  reaction at previous core needle biopsy site.  Fibrocystic changes.  See comment.    D.LEFT BREAST, DEEP MARGIN, EXCISION:  Benign breast adipose tissue.  Negative for malignancy.   RADIOGRAPHIC STUDIES: I have personally reviewed the radiological images as listed and agreed with the findings in the report.  No new scans    ASSESSMENT & PLAN:  55 year old pre-menopausal female with past medical history of hypertension, presented with a palpable left breast mass.  1. Left breast invasive ductal carcinoma, cT2N1M0, stage IIB, triple negative, ypT0N0 -I previously reviewed her surgical pathology findings, which showed complete pathological response. 2 sentinel lymph nodes were negative. -We discussed that complete pathology response predicts good outcome, although she still has small risk of cancer recurrence after surgery. -I do not recommend any additional adjuvant chemotherapy -She will not benefit from antiestrogen therapy given the negative ER/PR on her tumor cells  -We discussed breast cancer surveillance, with annual mammogram, physical exam and  lab every 3-4 months for the first 2 years, and every 6 months for additional 3 years. -She is  clinically doing well, physical exam was unremarkable. She recently had lab test with her primary care physician, normal per patient, I'll obtain a copy from her primary care physician Dr. polite. -I encouraged her to have healthy diet, exercise regularly, and try to lose some weight. -She is due for mammogram in May 2018, she prefers to do it at [facility], instead of [facility]  --Given her young age and triple negative disease, positive family history, she was referred to see genetic counselor for genetic testing, her genetic testing was negative.  3.Anxiety and coping -much better, she still takes Xanax as needed  4. Newly diagnosed type 2 diabetes -Continue Lantus and insulin sliding scale, follow-up with her primary care physician.   Plan -Lab and follow-up in 4 months, she will be due for screening mammogram in May 2018 -I'll obtain a copy of her recent labs from her primary care physician Dr. Lina Sar office   All questions were answered. The patient knows to call the clinic with any problems, questions or concerns. I spent 20 minutes counseling the patient face to face. The total time spent in the appointment was 25 minutes and more than 50% was on counseling.     Truitt Merle, MD 06/27/2016

## 2016-06-27 ENCOUNTER — Ambulatory Visit (HOSPITAL_BASED_OUTPATIENT_CLINIC_OR_DEPARTMENT_OTHER): Payer: BLUE CROSS/BLUE SHIELD | Admitting: Hematology

## 2016-06-27 ENCOUNTER — Encounter: Payer: Self-pay | Admitting: Hematology

## 2016-06-27 VITALS — BP 156/85 | HR 89 | Temp 98.6°F | Resp 18 | Ht 65.0 in | Wt 244.7 lb

## 2016-06-27 DIAGNOSIS — E1169 Type 2 diabetes mellitus with other specified complication: Secondary | ICD-10-CM

## 2016-06-27 DIAGNOSIS — E669 Obesity, unspecified: Secondary | ICD-10-CM

## 2016-06-27 DIAGNOSIS — Z853 Personal history of malignant neoplasm of breast: Secondary | ICD-10-CM | POA: Diagnosis not present

## 2016-06-27 DIAGNOSIS — Z171 Estrogen receptor negative status [ER-]: Principal | ICD-10-CM

## 2016-06-27 DIAGNOSIS — E119 Type 2 diabetes mellitus without complications: Secondary | ICD-10-CM

## 2016-06-27 DIAGNOSIS — C50212 Malignant neoplasm of upper-inner quadrant of left female breast: Secondary | ICD-10-CM

## 2016-07-06 ENCOUNTER — Telehealth: Payer: Self-pay | Admitting: Hematology

## 2016-07-06 NOTE — Telephone Encounter (Signed)
Left message for patient re lab/fu March 2018. Schedule mailed.

## 2016-07-13 IMAGING — MG MM DIGITAL DIAGNOSTIC BILAT CAD
6 series · 6 of 6 positions shown · non-contrast
Comparison: None

CLINICAL DATA: 53-year-old female with palpable mass in the upper
inner left breast discovered on self-examination. Also for annual
bilateral mammograms.

EXAM:
DIGITAL DIAGNOSTIC BILATERAL MAMMOGRAM WITH CAD
ULTRASOUND LEFT BREAST

[R CC]
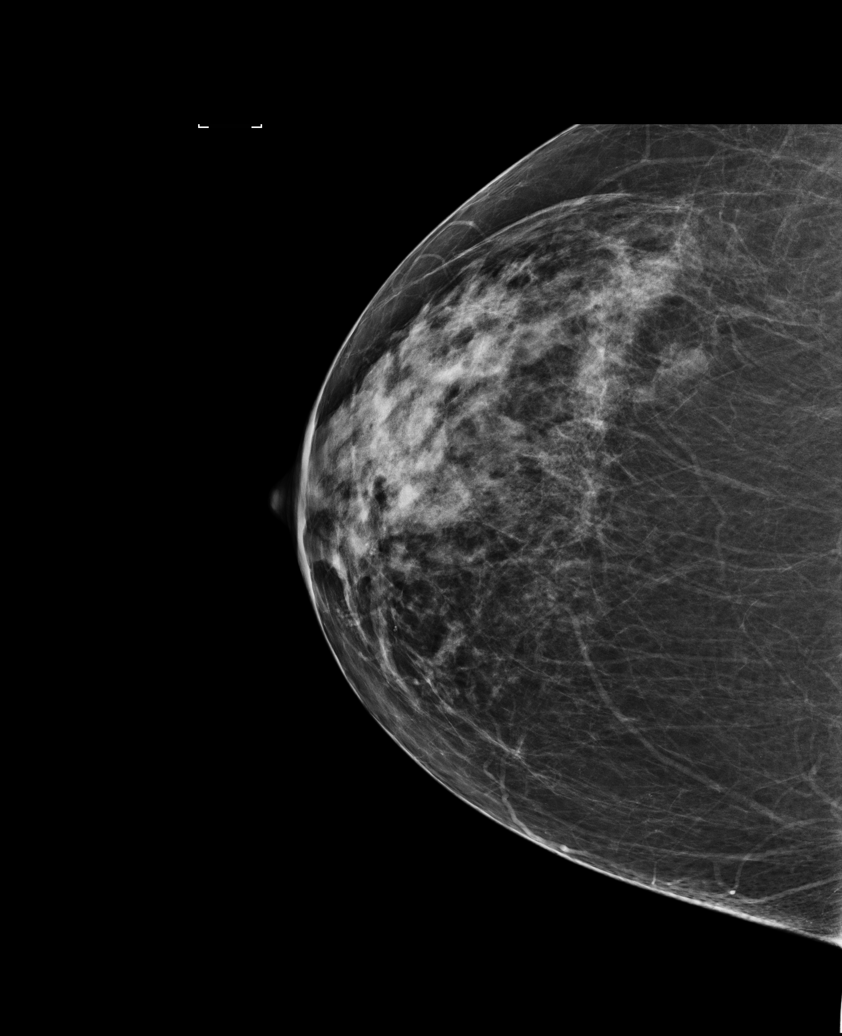

[L TAN (1 of 2)]
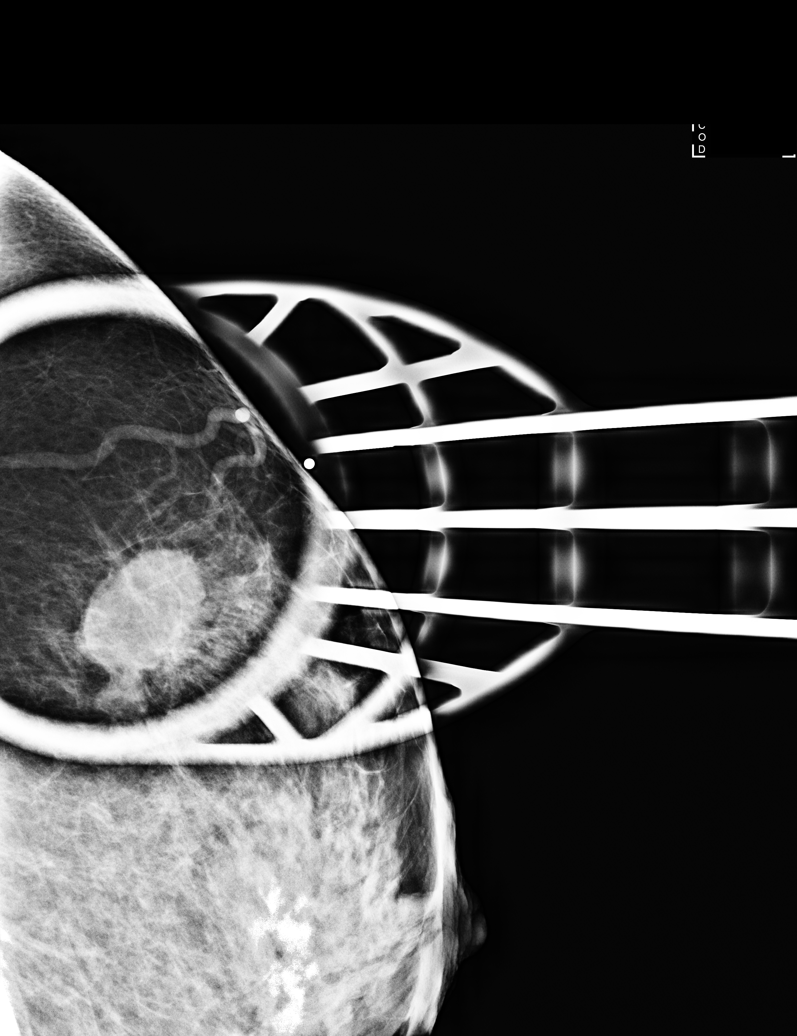

[R MLO]
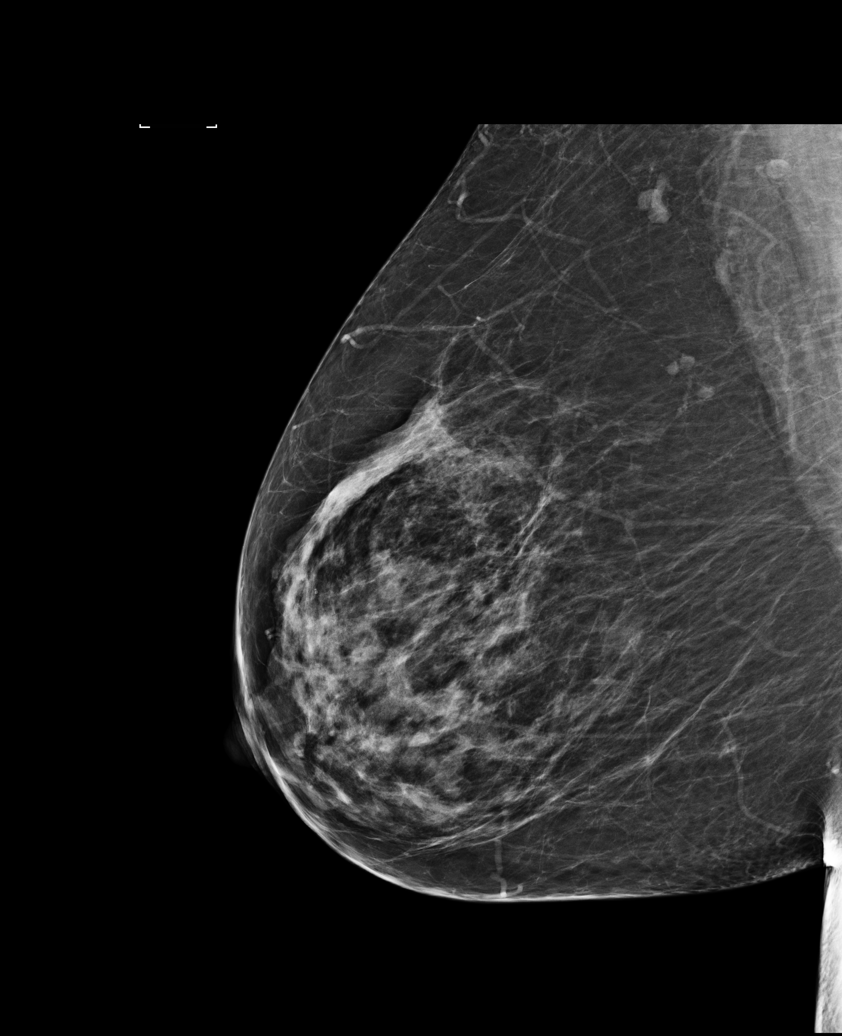

[L CC]
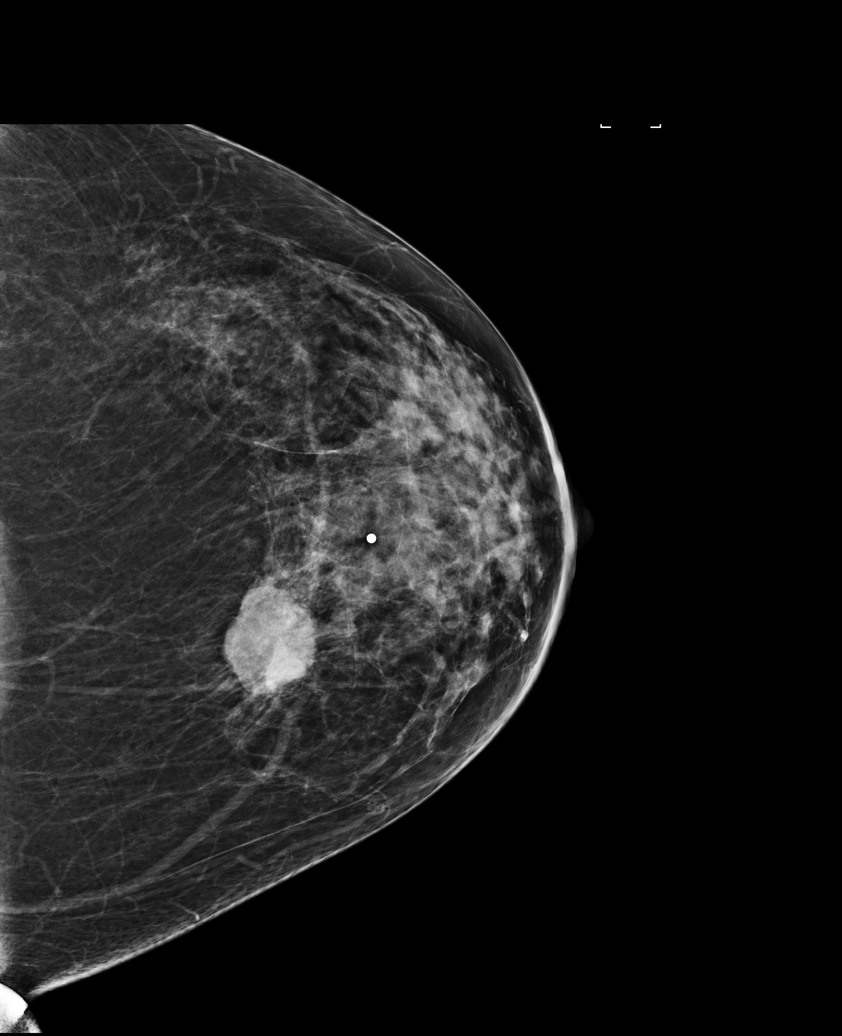

[L TAN (2 of 2)]
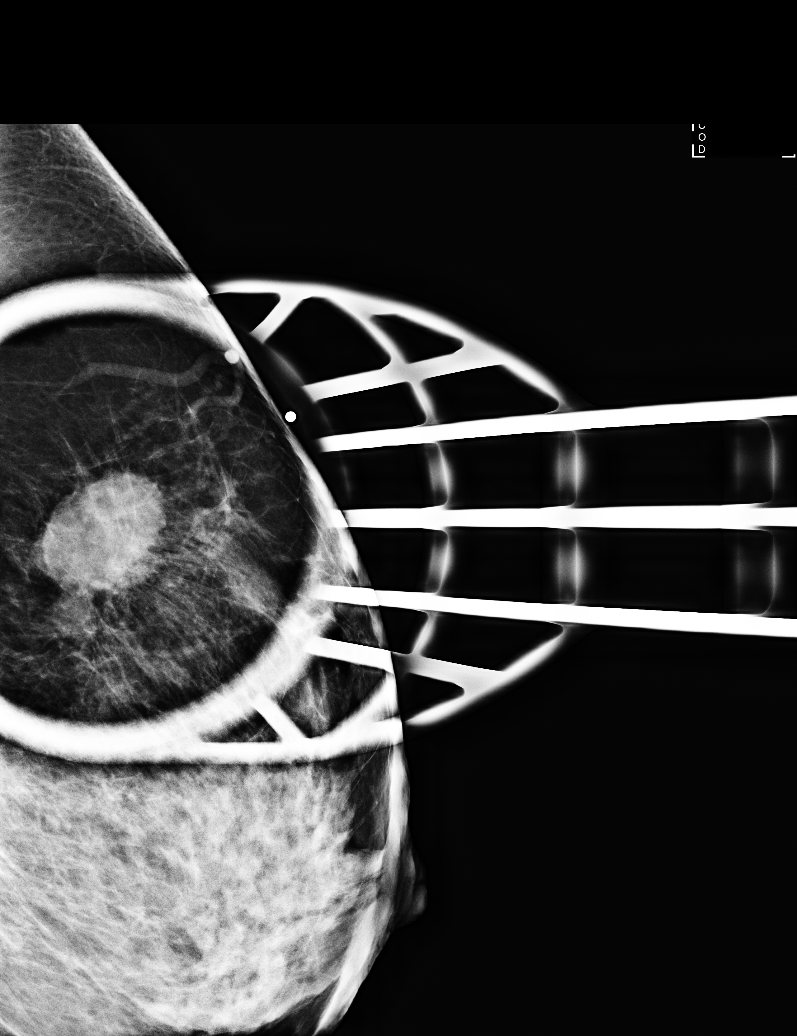

[L MLO]
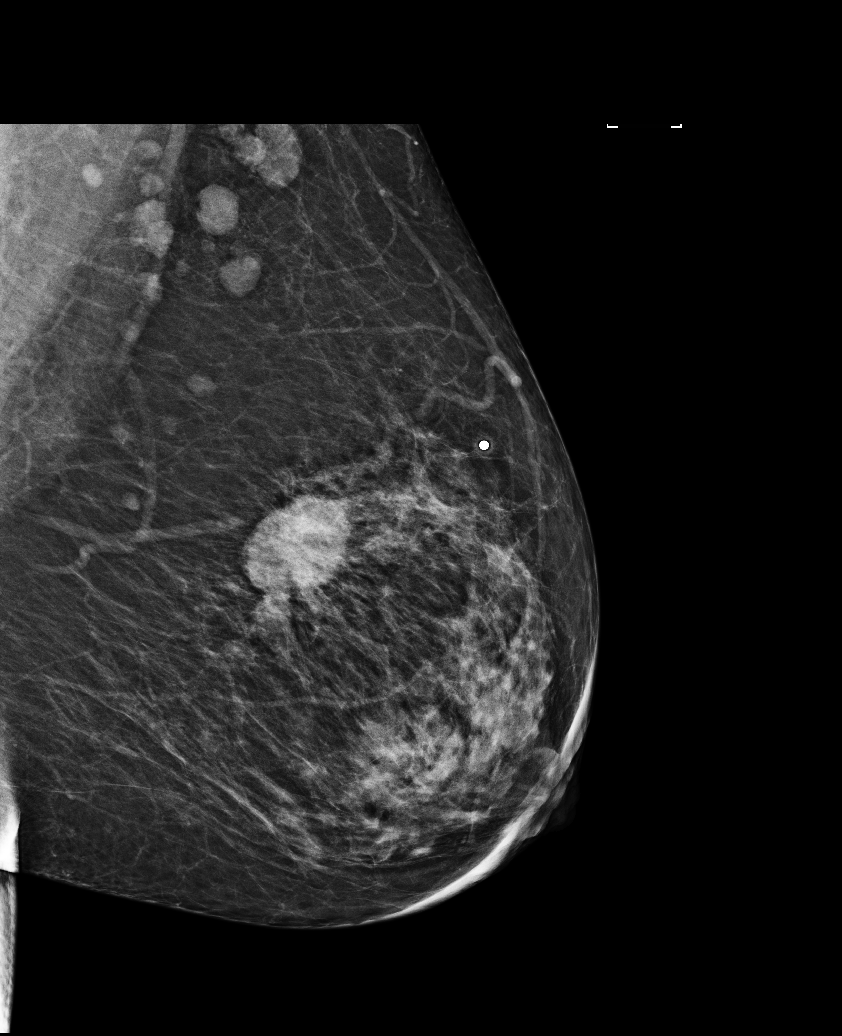

[6 of 6 positions shown; findings below may reference images not displayed]

ACR Breast Density Category b: There are scattered areas of
fibroglandular density.
FINDINGS: Spot compression views of the left breast and routine views of both
breasts demonstrate a 2 x 2.5 cm irregular mass in the upper inner
left breast.

Mildly prominent left axillary lymph nodes are noted.

No suspicious findings in the right breast identified.

Mammographic images were processed with CAD.

On physical exam, a firm fixed area of thickening is identified at
the 11 o'clock position of the left breast 6 cm from the nipple.

Targeted ultrasound is performed, showing a 1.9 x 2.5 x 2.3 cm
irregular hypoechoic mass at the 11 o'clock position of the left
breast 6 cm from the nipple. An adjacent 1.1 x 0.5 x 0.4 cm
irregular hypoechoic mass is noted, 4 mm anterior and lateral to the
larger irregular mass. Both of these masses and compress an area
measuring 3 cm.

Enlarged level 1 left axillary lymph nodes are identified.
IMPRESSION: 2.5 cm irregular mass in the upper inner left breast with adjacent
1.1 cm satellite mass and enlarged left axillary lymph nodes, highly
suspicious for left breast malignancy and left axillary lymph node
metastases. Tissue sampling is recommended.

No mammographic evidence of right breast malignancy.

RECOMMENDATION:
Ultrasound-guided biopsies of dominant upper inner left breast mass
and an enlarged left axillary lymph node. These procedures will be
performed today but dictated in a separate report.

I have discussed the findings and recommendations with the patient.
Results were also provided in writing at the conclusion of the
visit. If applicable, a reminder letter will be sent to the patient
regarding the next appointment.

BI-RADS CATEGORY  5: Highly suggestive of malignancy.

## 2016-07-13 IMAGING — MG MM DIGITAL DIAGNOSTIC UNILAT*L*
2 series · 2 of 2 positions shown · non-contrast
Comparison: Previous exams

CLINICAL DATA: Evaluate clip placement following ultrasound-guided
left breast biopsy.

EXAM:
DIAGNOSTIC LEFT MAMMOGRAM POST ULTRASOUND BIOPSY

[L ML]
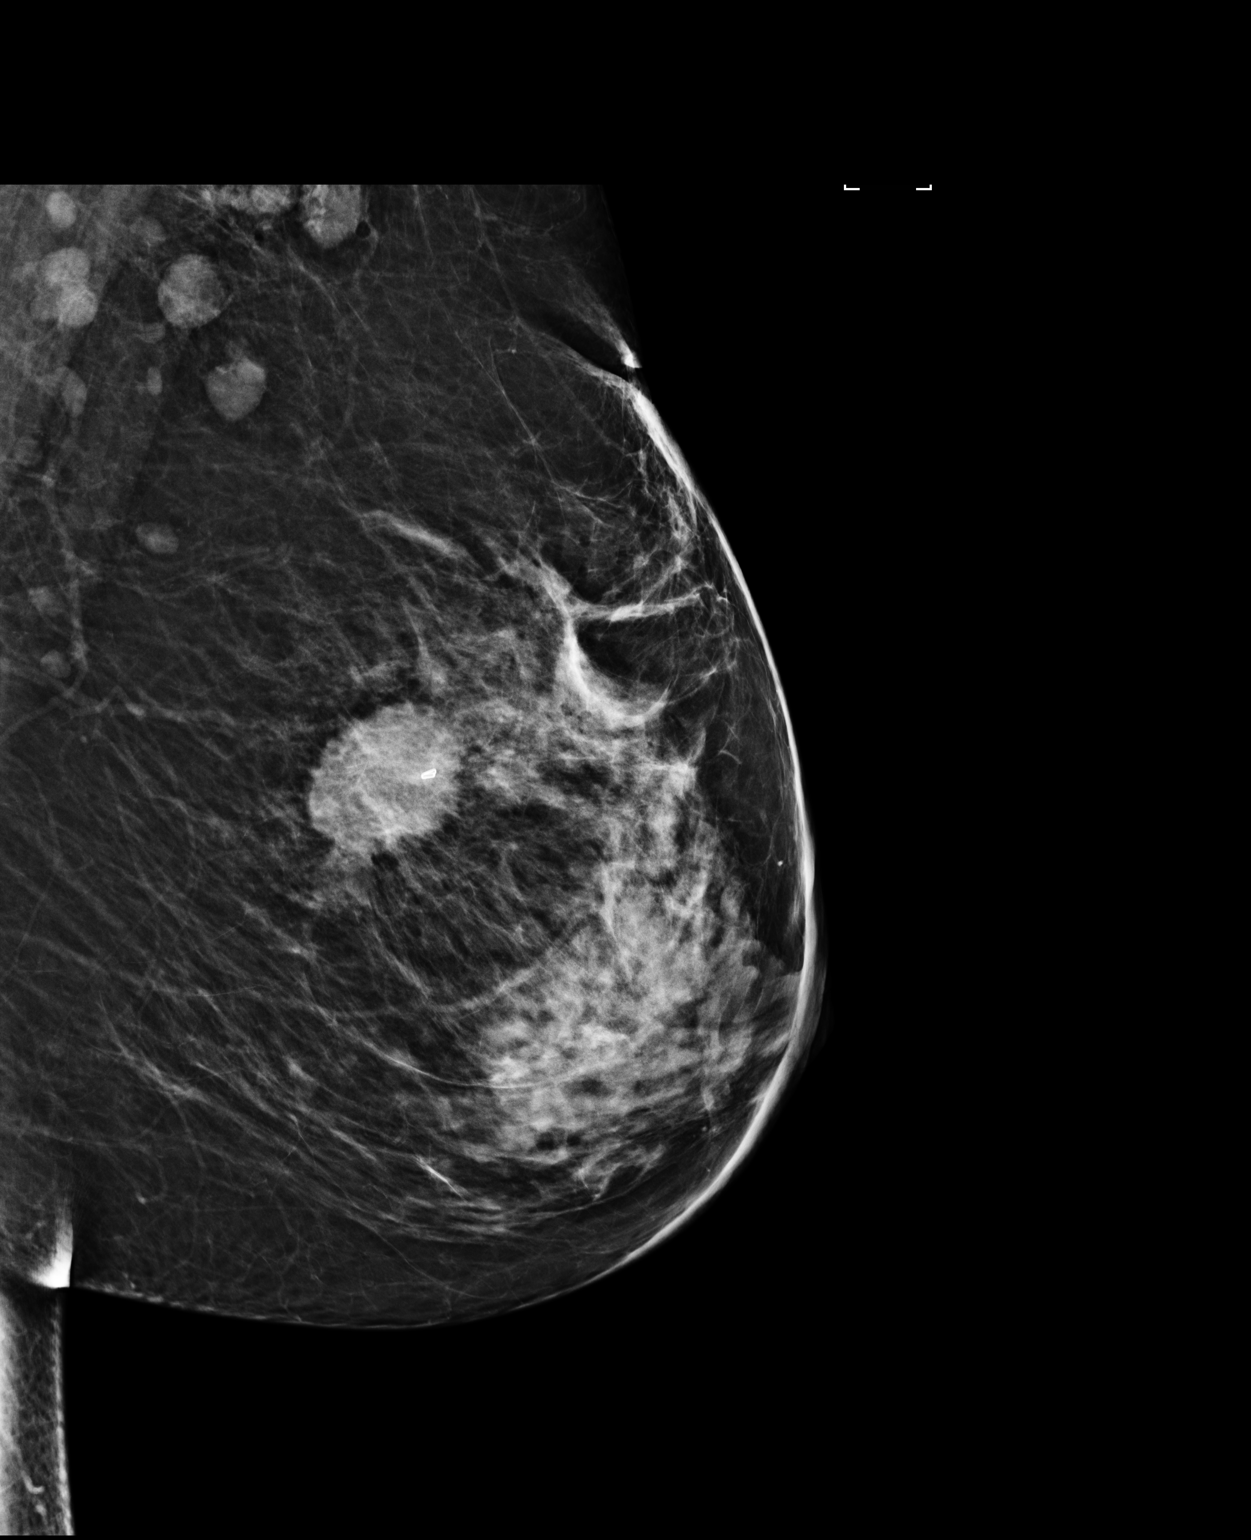

[L CC]
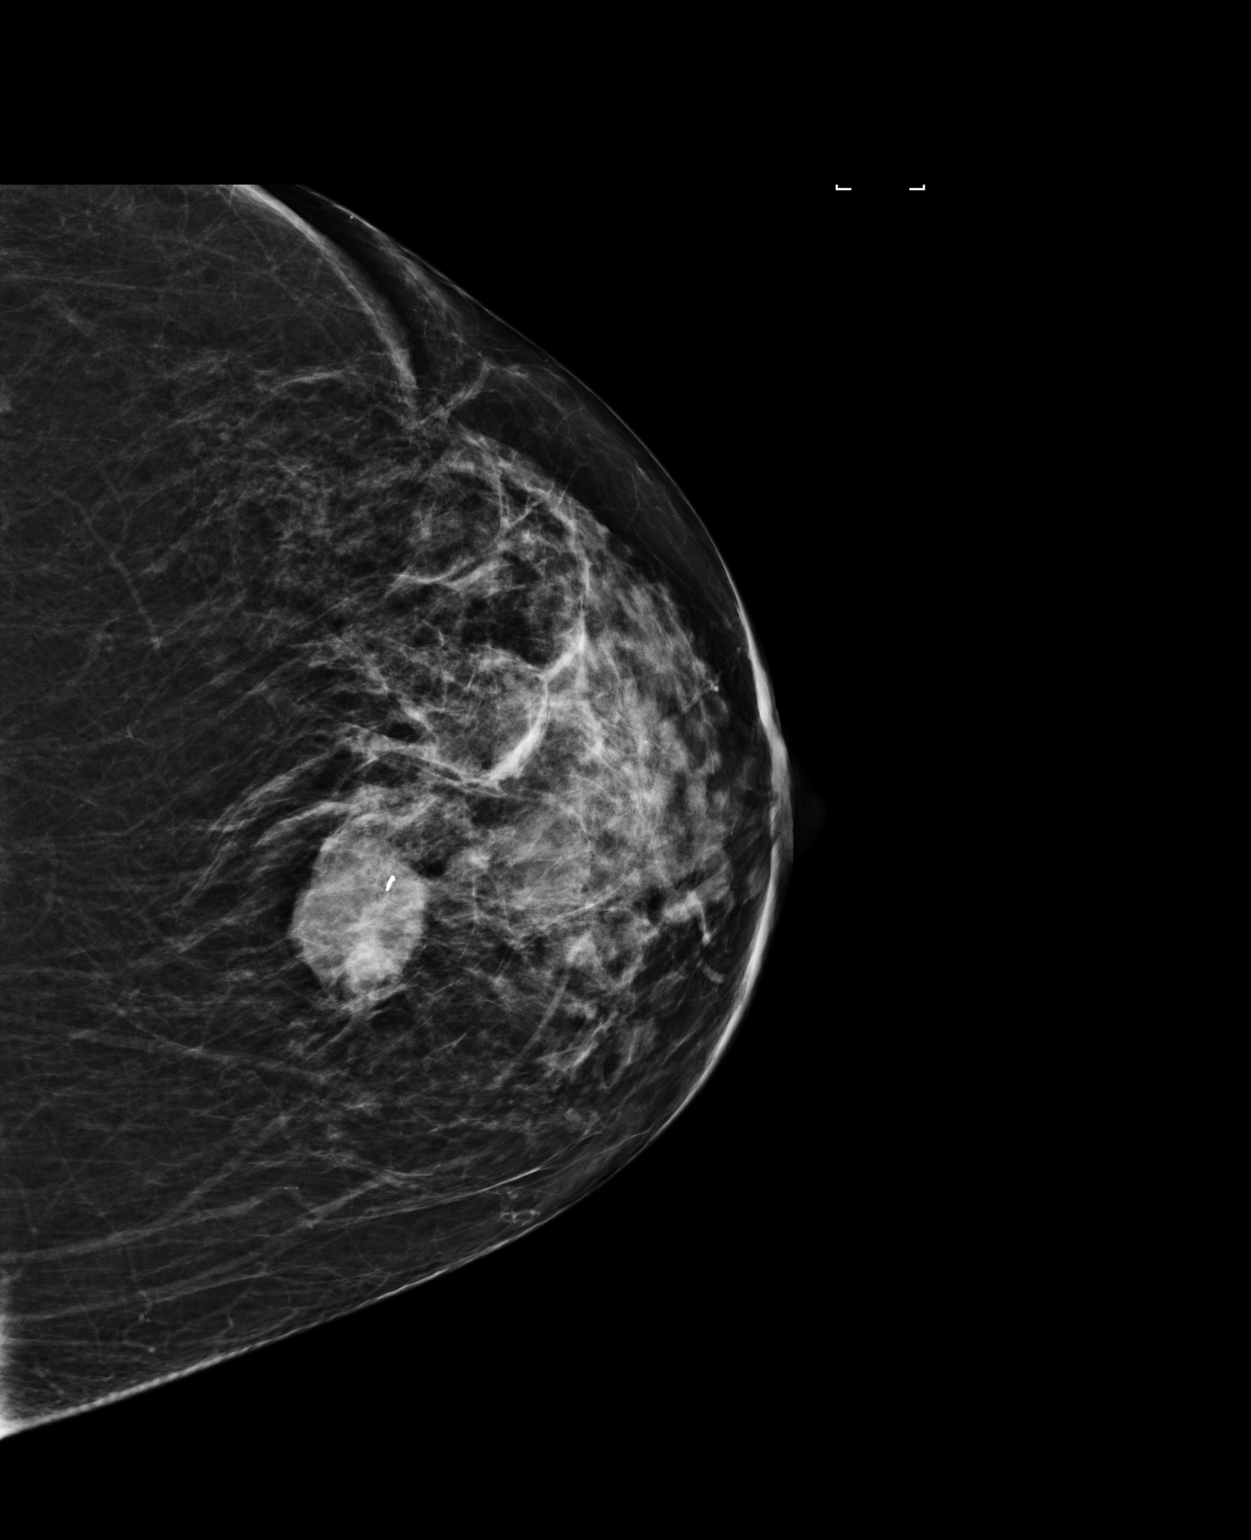

[2 of 2 positions shown; findings below may reference images not displayed]

FINDINGS: Mammographic images were obtained following ultrasound guided biopsy
of the 2.5 cm mass at the 11 o'clock position of the left breast.

The paper clip heart shaped tissue marker is in satisfactory
position.

No immediate complications identified.
IMPRESSION: Satisfactory clip placement following ultrasound-guided left breast
biopsy.

Final Assessment: Post Procedure Mammograms for Marker Placement

## 2016-08-16 ENCOUNTER — Other Ambulatory Visit: Payer: Self-pay | Admitting: Nurse Practitioner

## 2016-10-31 ENCOUNTER — Other Ambulatory Visit: Payer: BLUE CROSS/BLUE SHIELD

## 2016-10-31 ENCOUNTER — Ambulatory Visit: Payer: BLUE CROSS/BLUE SHIELD | Admitting: Hematology

## 2017-03-20 ENCOUNTER — Other Ambulatory Visit (HOSPITAL_BASED_OUTPATIENT_CLINIC_OR_DEPARTMENT_OTHER): Payer: BLUE CROSS/BLUE SHIELD

## 2017-03-20 ENCOUNTER — Ambulatory Visit (HOSPITAL_BASED_OUTPATIENT_CLINIC_OR_DEPARTMENT_OTHER): Payer: BLUE CROSS/BLUE SHIELD | Admitting: Hematology

## 2017-03-20 ENCOUNTER — Encounter: Payer: Self-pay | Admitting: Hematology

## 2017-03-20 VITALS — BP 151/87 | HR 96 | Temp 98.2°F | Resp 18 | Ht 65.0 in | Wt 239.6 lb

## 2017-03-20 DIAGNOSIS — C50212 Malignant neoplasm of upper-inner quadrant of left female breast: Secondary | ICD-10-CM

## 2017-03-20 DIAGNOSIS — E119 Type 2 diabetes mellitus without complications: Secondary | ICD-10-CM

## 2017-03-20 DIAGNOSIS — Z171 Estrogen receptor negative status [ER-]: Secondary | ICD-10-CM

## 2017-03-20 DIAGNOSIS — E1169 Type 2 diabetes mellitus with other specified complication: Secondary | ICD-10-CM

## 2017-03-20 DIAGNOSIS — E669 Obesity, unspecified: Secondary | ICD-10-CM

## 2017-03-20 DIAGNOSIS — C773 Secondary and unspecified malignant neoplasm of axilla and upper limb lymph nodes: Secondary | ICD-10-CM

## 2017-03-20 LAB — CBC WITH DIFFERENTIAL/PLATELET
BASO%: 0.2 % (ref 0.0–2.0)
BASOS ABS: 0 10*3/uL (ref 0.0–0.1)
EOS%: 1.3 % (ref 0.0–7.0)
Eosinophils Absolute: 0.1 10*3/uL (ref 0.0–0.5)
HEMATOCRIT: 40.4 % (ref 34.8–46.6)
HGB: 14.2 g/dL (ref 11.6–15.9)
LYMPH%: 20.6 % (ref 14.0–49.7)
MCH: 34.2 pg — AB (ref 25.1–34.0)
MCHC: 35.1 g/dL (ref 31.5–36.0)
MCV: 97.3 fL (ref 79.5–101.0)
MONO#: 0.5 10*3/uL (ref 0.1–0.9)
MONO%: 7.8 % (ref 0.0–14.0)
NEUT#: 4.4 10*3/uL (ref 1.5–6.5)
NEUT%: 70.1 % (ref 38.4–76.8)
PLATELETS: 171 10*3/uL (ref 145–400)
RBC: 4.15 10*6/uL (ref 3.70–5.45)
RDW: 12.6 % (ref 11.2–14.5)
WBC: 6.3 10*3/uL (ref 3.9–10.3)
lymph#: 1.3 10*3/uL (ref 0.9–3.3)

## 2017-03-20 LAB — COMPREHENSIVE METABOLIC PANEL
ALT: 19 U/L (ref 0–55)
ANION GAP: 10 meq/L (ref 3–11)
AST: 15 U/L (ref 5–34)
Albumin: 3.9 g/dL (ref 3.5–5.0)
Alkaline Phosphatase: 155 U/L — ABNORMAL HIGH (ref 40–150)
BUN: 15.1 mg/dL (ref 7.0–26.0)
CALCIUM: 9.8 mg/dL (ref 8.4–10.4)
CHLORIDE: 99 meq/L (ref 98–109)
CO2: 27 meq/L (ref 22–29)
CREATININE: 1 mg/dL (ref 0.6–1.1)
EGFR: 62 mL/min/{1.73_m2} — AB (ref >90)
Glucose: 278 mg/dl — ABNORMAL HIGH (ref 70–140)
POTASSIUM: 4.2 meq/L (ref 3.5–5.1)
Sodium: 136 mEq/L (ref 136–145)
Total Bilirubin: 0.49 mg/dL (ref 0.20–1.20)
Total Protein: 7.5 g/dL (ref 6.4–8.3)

## 2017-03-20 NOTE — Progress Notes (Signed)
[place]  [phone] [phone]  Clinic follow Up Note   Patient Care Team: Seward Carol, MD as PCP - General (Internal Medicine) Erroll Luna, MD as Consulting Physician (General Surgery) Truitt Merle, MD as Consulting Physician (Hematology) Thea Silversmith, MD (Inactive) as Consulting Physician (Radiation Oncology) Rockwell Germany, RN as Registered Nurse Mauro Kaufmann, RN as Registered Nurse 03/20/2017  CHIEF COMPLAINTS  Follow up breast cancer   Oncology History   Breast cancer of upper-inner quadrant of left female breast   Staging form: Breast, AJCC 7th Edition     Clinical stage from 10/08/2014: Stage IIB (T2, N1, M0) - Unsigned     Pathologic: Stage IIB (T2, N1, cM0) - Unsigned       Breast cancer of upper-inner quadrant of left female breast ([place])   09/26/2014 Mammogram    Spot compression views of the left breast and routine views of both breasts demonstrate a 2 x 2.5 cm irregular mass in the upper inner left breast.       09/26/2014 Breast US    Left breast: 2.5 cm irregular mass in the upper inner with adjacent 1.1 cm satellite mass and enlarged left axillary lymph nodes, highly suspicious for left breast malignancy and left axillary lymph node metastases. Tissue sampling is recommended.       09/29/2014 Initial Biopsy    1. Breast, left, needle core biopsy, mass, 11 o'clock:- INVASIVE MAMMARY CARCINOMA, SEE COMMENT. - MAMMARY CARCINOMA IN SITU. 2. Lymph node, needle/core biopsy, left axillary - ONE LYMPH NODE, POSITIVE FOR METASTATIC MAMMARY CARCINOMA (1/1).      09/29/2014 Receptors her2    Estrogen Receptor: 0%, NEGATIVE Progesterone Receptor: 0%, NEGATIVE, HER2 negative,  Proliferation Marker Ki67: 93%      10/08/2014 Clinical Stage    Stage IIB: t2 N1      10/17/2014 Imaging    CT and bone scan negative for distant metastasis       10/24/2014 - 03/18/2015 Adjuvant Chemotherapy    ddACx4 then weekly Taxol X12        11/14/2014 Miscellaneous    Genetic testing BreastNext @ [facility] 2 variants with unknown significance: ATM p.[id number] and RAD51C p.I52L; otherwise neg at ATM, BARD1, BRCA1, BRCA2, BRIP1, CDH1, CHEK2, MRE11A, MUTYH, NBN, NF1, PALB2, PTEN, RAD50, RAD51C, RAD51D, and TP53      04/01/2015 Breast MRI    Complete resolution of the previously described left breast mass and left axillary adenopathy. No other new lesions.      05/01/2015 Surgery    Left breast lumpectomy and sentinel lymph nodes biopsy at [facility]  No residual malignancy; margins were negative. 2 LN removed and negative.      05/01/2015 Pathologic Stage    Stage IIB: T2 N1      06/24/2015 - 08/11/2015 Radiation Therapy    Adjuvant RT: Left breast/ 45 Gy at 1.8 Gy per fraction x 25 fractions.  Left supraclavicular fossa and axilla/ 45 Gy at 1.8 Gy per fraction x 25 fractions Left breast boost/ 16 Gy at 2 Gy per fraction x 8 fractions        HISTORY OF PRESENTING ILLNESS:  Ashlee Hill 56 y.o. female presents to our multidisciplinary breast clinic today to discuss the management of her newly diagnosed breast cancer  She noticed a left breast lump in August 2015, no tenderness, skin or nipple change. She otherwise felt well. She did not seek immediate medical attention due to lack of insurance.  She finally got her insurance approved and saw her primary care physician recently. She was referred for mammogram which showed a 2.5 cm irregular mass in the upper inner left breast. She underwent left breast mass and axillary node biopsy on 09/29/2014 and both biopsy showed invasive ductal carcinoma, ER negative, PR negative, HER-2 negative.  She otherwise feels well, no pain or ther complains. She has good appetite and her weight is stable.  CURRENT THERAPY: Surveillance  INTERIM HISTORY Ashlee Hill returns for follow-up. She presents to the clinic today reporting she has cleared up some issues with her insurance  to get her lantis medication to help stabilize her sugar. Her sugar recently has been high. She reports to being stressed with her parents health and she is also back at work with customer service.  She has no concerns but she will have the hematoma throb occasionally. 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  She recently got a new puppy.  For her hematoma if she does not touch it mostly goes unnoticed.    MEDICAL HISTORY:  Past Medical History:  Diagnosis Date  . Breast cancer ([place])   . Breast cancer of upper-inner quadrant of left female breast ([place]) 10/01/2014  . Diabetes mellitus without complication ([place]) 2/50/03   chemo caused diabetes per pt   . Family history of breast cancer   . Hypertension   . Wears glasses    driving    SURGICAL HISTORY: Past Surgical History:  Procedure Laterality Date  . Catarct Extracts Bilateral  08-11-15   Right Eye  . Left Eye Cayract Extraction  08-18-15   Left Eye  . Port-A-Cath Removed  08-18-15  . PORTACATH PLACEMENT Right 10/21/2014   Procedure: INSERTION PORT-A-CATH;  Surgeon: Erroll Luna, MD;  Location: [facility];  Service: General;  Laterality: Right;  . TONSILLECTOMY    . WISDOM TOOTH EXTRACTION       GYN HISTORY  Menarchal: 12 LMP:  Contraceptive: no HRT: no  G0P0    SOCIAL HISTORY: Social History   Social History  . Marital status: Single    Spouse name: N/A  . Number of children: N/A  . Years of education: N/A   Occupational History  . Not on file.   Social History Main Topics  . Smoking status: Never Smoker  . Smokeless tobacco: Never Used  . Alcohol use 0.6 oz/week    1 Glasses of wine per week     Comment: socail drinker  . Drug use: No  . Sexual activity: Not on file   Other Topics Concern  . Not on file   Social History Narrative  . No narrative on file    FAMILY HISTORY: Family History  Problem Relation Age of Onset  . Prostate cancer Father 37       currently 60   . Breast cancer Paternal Aunt 48       deceased 59  . Lung cancer Maternal Grandfather 75       smoker; deceased  . Thyroid cancer Cousin 66       pat first cousin; currently 81     Father had prostate cancer at age of 109 Paternal aunt had breast caner in her 24's Paternal cousin had thyroid cancer at age of before 69 Maternal grandfather had lung cancer    ALLERGIES:  is allergic to tegaderm ag mesh [silver].  MEDICATIONS:  Current Outpatient Prescriptions  Medication Sig Dispense Refill  . ALPRAZolam (XANAX) 1 MG tablet  Take 1-2 mg by mouth 2 (two) times daily. Take 1 tablet (1 mg) in the morning & Take 2 tablets (2 mg) in the evening.    Marland Kitchen aspirin 81 MG tablet Take 81 mg by mouth daily. Reported on 08/06/2015    . atorvastatin (LIPITOR) 10 MG tablet Take 10 mg by mouth daily.    . B-D UF III MINI PEN NEEDLES 31G X 5 MM MISC   0  . calcium carbonate (OS-CAL) 600 MG TABS tablet Take 600 mg by mouth daily with breakfast.    . gabapentin (NEURONTIN) 600 MG tablet Take 600 mg by mouth at bedtime. As needed for sleep or anxiety    . hydrochlorothiazide (HYDRODIURIL) 25 MG tablet Take 25 mg by mouth daily.     . insulin lispro (HUMALOG) 100 UNIT/ML injection Inject into the skin 2 (two) times daily. Sliding scale >150-4 u, >200 8 units, >250-12 units, > 300 16 units,    . LANTUS SOLOSTAR 100 UNIT/ML Solostar Pen Inject 30 Units into the skin at bedtime.   0  . metoprolol (LOPRESSOR) 100 MG tablet Take 100 mg by mouth daily.    . ONE TOUCH ULTRA TEST test strip TEST BLOOD SUGAR AS DIRECTED THREE TIMES A DAY  0  . ONETOUCH DELICA LANCETS 29U MISC use to STICK FINGER three times a day as directed  0  . venlafaxine XR (EFFEXOR-XR) 150 MG 24 hr capsule Take 150 mg by mouth daily with breakfast.    . zolpidem (AMBIEN) 10 MG tablet Take 10 mg by mouth at bedtime as needed for sleep. Reported on 09/17/2015     No current facility-administered medications for this visit.     REVIEW OF  SYSTEMS:   Constitutional: Denies fevers, chills or abnormal night sweats Eyes: Denies blurriness of vision, double vision or watery eyes Ears, nose, mouth, throat, and face: resolving mucositis and sore throat Respiratory: Denies cough, dyspnea or wheezes Cardiovascular: Denies palpitation, chest discomfort or lower extremity swelling Gastrointestinal:  Denies nausea, heartburn or change in bowel habits Skin: Denies abnormal skin rashes Lymphatics: Denies new lymphadenopathy or easy bruising Neurological:Denies numbness, tingling or new weaknesses MSK: (+) arthritis in hands mostly   Behavioral/Psych: Mood is stable, no new changes  Breast: (+) occasional pain due to hematoma from incision  All other systems were reviewed with the patient and are negative.  PHYSICAL EXAMINATION: ECOG PERFORMANCE STATUS: 1  Vitals:   03/20/17 1537  BP: (!) 151/87  Pulse: 96  Resp: 18  Temp: 98.2 F (36.8 C)   Filed Weights   03/20/17 1537  Weight: 239 lb 9.6 oz (108.7 kg)    GENERAL:alert, no distress and comfortable SKIN: skin color, texture, turgor are normal, no rashes or significant lesions. (+) Diffuse skin erythema and some pigmentation in the left breast, no blisters or skin ulcers.  EYES: normal, conjunctiva are pink and non-injected, sclera clear OROPHARYNX:no exudate, no erythema and lips, buccal mucosa, and tongue normal  NECK: supple, thyroid normal size, non-tender, without nodularity LYMPH:  no palpable lymphadenopathy in the cervical, axillary or inguinal LUNGS: clear to auscultation and percussion with normal breathing effort HEART: regular rate & rhythm and no murmurs and no lower extremity edema ABDOMEN:abdomen soft, non-tender and normal bowel sounds Musculoskeletal:no cyanosis of digits and no clubbing  PSYCH: alert & oriented x 3 with fluent speech NEURO: no focal motor/sensory deficits Breasts: Breast inspection showed them to be symmetrical with no nipple discharge.  There is a 4 x 5  cm lump at the left breasts surgical incision, likely the residual hematoma/scar, no other palpable breast mass or axillary adenopathy.  LABORATORY DATA:  I have reviewed the data as listed CBC Latest Ref Rng & Units 03/20/2017 08/06/2015 06/03/2015  WBC 3.9 - 10.3 10e3/uL 6.3 4.9 5.9  Hemoglobin 11.6 - 15.9 g/dL 14.2 14.1 14.1  Hematocrit 34.8 - 46.6 % 40.4 40.8 41.4  Platelets 145 - 400 10e3/uL 171 135(L) 161   CMP Latest Ref Rng & Units 03/20/2017 08/06/2015 06/03/2015  Glucose 70 - 140 mg/dl 278(H) 245(H) 167(H)  BUN 7.0 - 26.0 mg/dL 15.1 9.0 14.4  Creatinine 0.6 - 1.1 mg/dL 1.0 1.2(H) 0.8  Sodium 136 - 145 mEq/L 136 134(L) 136  Potassium 3.5 - 5.1 mEq/L 4.2 4.2 4.9  Chloride 96 - 112 mmol/L - - -  CO2 22 - 29 mEq/L 27 21(L) 24  Calcium 8.4 - 10.4 mg/dL 9.8 9.6 9.5  Total Protein 6.4 - 8.3 g/dL 7.5 7.2 7.4  Total Bilirubin 0.20 - 1.20 mg/dL 0.49 1.20 0.71  Alkaline Phos 40 - 150 U/L 155(H) 96 113  AST 5 - 34 U/L 15 40(H) 40(H)  ALT 0 - 55 U/L 19 43 42     PATHOLOGY REPORT 09/26/2014 Diagnosis 1. Breast, left, needle core biopsy, mass, 11 o'clock - INVASIVE MAMMARY CARCINOMA, SEE COMMENT. - MAMMARY CARCINOMA IN SITU. 2. Lymph node, needle/core biopsy, left axillary - ONE LYMPH NODE, POSITIVE FOR METASTATIC MAMMARY CARCINOMA (1/1). Microscopic Comment 1. Although grade of tumor is best assessed at resection, with these biopsies, both the in situ and invasive carcinoma are grade II. The invasive carcinoma demonstrates strong diffuse E-cadherin expression; supporting a ductal phenotype. 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  Estrogen Receptor: 0%, NEGATIVE Progesterone Receptor: 0%, NEGATIVE Proliferation Marker Ki67: 93% 1. A sample was sent to [facility] for HER-2 testing by FISH. The results are as  follows: Negative.   ACCESSION NUMBER:[id number]  RECEIVED: 05/01/2015  ORDERING PHYSICIAN:MARISSA HOWARD-MCNATT , MD  PATIENT NAME:Hill, Ashlee L  SURGICAL PATHOLOGY REPORT    FINAL PATHOLOGIC DIAGNOSIS  MICROSCOPIC EXAMINATION AND DIAGNOSIS    A.LEFT AXILLA SENTINEL LYMPH NODE #1, EXCISION:  No carcinoma identified in one lymph node (0/1).    B.LEFT AXILLA SENTINEL LYMPH NODE #2, EXCISION:  No carcinoma identified in one lymph node (0/1).    C.LEFT BREAST, LUMPECTOMY:  No residual invasive carcinoma identified.  Fibrosis with chronic inflammation and foreign body giant cell  reaction at previous core needle biopsy site.  Fibrocystic changes.  See comment.    D.LEFT BREAST, DEEP MARGIN, EXCISION:  Benign breast adipose tissue.  Negative for malignancy.   RADIOGRAPHIC STUDIES:  Diagnostic mammogram bilateral 01/11/2017 at [facility]  No mammographic evidence of malignancy.  Breast composition: B - Scattered fibroglandular density.  BI-RADS Category: 2 - Benign findings. Recommend routine follow-up.  RECOMMENDATIONS: M - Follow-up Mammogram in 1 Year   ASSESSMENT & PLAN:  56 y.o.  pre-menopausal female with past medical history of hypertension, presented with a palpable left breast mass.  BILATERAL DIAGNOSTIC MAMMOGRAM, 01/11/2017  FINDINGS:  No suspicious mass or calcifications are seen in either breast. Again noted is a postoperative seroma at the left lumpectomy site which has diminished slightly in size. No evidence of recurrence. The contralateral right breast remains negative.  1. Left breast invasive ductal carcinoma, cT2N1M0, stage IIB, triple negative, ypT0N0 -I previously reviewed  her surgical pathology findings, which showed complete pathological response. 2 sentinel lymph nodes were negative. -We discussed that complete pathology response predicts  good outcome, although she still has small risk of cancer recurrence after surgery. -I do not recommend any additional adjuvant chemotherapy -She will not benefit from antiestrogen therapy given the negative ER/PR on her tumor cells  -We discussed breast cancer surveillance, with annual mammogram, physical exam and lab every 3-4 months for the first 2 years, and every 6 months for additional 3 years. -I encouraged her to have healthy diet, exercise regularly, and try to lose some weight. -She is clinically doing well, physical exam was unremarkable. Lab reviewed, her sugar is high but otherwise normal. Mammogram from 12/2016 was normal.  -She is now 2.5 years from diagnosis, we discussed is that her risk of recurrence has decreased significantly. I will f/u with her in 6 months, for a total of 5 years   -I recommend to stay up on all her cacner screenings with her physicians and get her sugar under control.   2. Genetics  -Given her young age and triple negative disease, positive family history, she was referred to see genetic counselor for genetic testing, her genetic testing was negative.  3.Anxiety and coping -much better, she still takes Xanax as needed  4. Uncontrolled type 2 diabetes -Continue Lantus and insulin sliding scale, follow-up with her primary care physician Dr. Delfina Redwood -She ran out of Lantus 2 weeks ago, she is going to refill today. I strongly encouraged her to stay on treatment.   Plan -Copy note to Dr. Delfina Redwood, she will refill lantus today  -Lab and follow-up in 6 months, she will be due for screening mammogram in May 2019  All questions were answered. The patient knows to call the clinic with any problems, questions or concerns. I spent 20 minutes counseling the patient face to face. The total time spent in the appointment was 25 minutes and more than 50% was on counseling.  This document serves as a record of services personally performed by Truitt Merle, MD. It was created  on her behalf by Joslyn Devon, a trained medical scribe. The creation of this record is based on the scribe's personal observations and the provider's statements to them. This document has been checked and approved by the attending provider.     Truitt Merle, MD 03/20/2017

## 2017-03-21 ENCOUNTER — Telehealth: Payer: Self-pay | Admitting: Hematology

## 2017-03-21 NOTE — Telephone Encounter (Signed)
Called and left voicemail for the patient regarding her upcoming appointments in January.

## 2017-09-01 ENCOUNTER — Telehealth: Payer: Self-pay | Admitting: *Deleted

## 2017-09-01 NOTE — Telephone Encounter (Signed)
Pt called and left message requesting to cancel office visit appt.  Pt is scheduled for  09/21/17.   Spoke with pt, and was informed that her father is in the hospital, and will be discharged to rehab center.  Stated she would not have time to think about herself now.  Reinforced that pt still needs to follow up with Dr. Burr Medico.  Offered to reschedule pt; however, pt declined.  Encouraged pt to reschedule appts when she is ready.  Pt voiced understanding.  Appts cancelled as per request. Pt's   Phone     [phone].

## 2017-09-21 ENCOUNTER — Other Ambulatory Visit: Payer: BLUE CROSS/BLUE SHIELD

## 2017-09-21 ENCOUNTER — Ambulatory Visit: Payer: BLUE CROSS/BLUE SHIELD | Admitting: Hematology

## 2019-07-22 ENCOUNTER — Other Ambulatory Visit (HOSPITAL_COMMUNITY): Payer: Self-pay | Admitting: Internal Medicine

## 2019-07-22 ENCOUNTER — Other Ambulatory Visit: Payer: Self-pay | Admitting: Internal Medicine

## 2019-07-22 DIAGNOSIS — R42 Dizziness and giddiness: Secondary | ICD-10-CM

## 2019-07-23 ENCOUNTER — Ambulatory Visit (HOSPITAL_COMMUNITY)
Admission: RE | Admit: 2019-07-23 | Discharge: 2019-07-23 | Disposition: A | Discharge status: Home / Self Care | Payer: BC Managed Care – PPO | Source: Ambulatory Visit | Attending: Internal Medicine | Admitting: Internal Medicine

## 2019-07-23 ENCOUNTER — Other Ambulatory Visit: Payer: Self-pay

## 2019-07-23 DIAGNOSIS — R42 Dizziness and giddiness: Secondary | ICD-10-CM | POA: Insufficient documentation

## 2019-07-26 ENCOUNTER — Other Ambulatory Visit: Payer: Self-pay | Admitting: Internal Medicine

## 2019-07-26 ENCOUNTER — Other Ambulatory Visit (HOSPITAL_COMMUNITY): Payer: Self-pay | Admitting: Internal Medicine

## 2019-07-26 DIAGNOSIS — I639 Cerebral infarction, unspecified: Secondary | ICD-10-CM

## 2019-07-30 ENCOUNTER — Other Ambulatory Visit: Payer: Self-pay

## 2019-07-30 ENCOUNTER — Ambulatory Visit
Admission: RE | Admit: 2019-07-30 | Discharge: 2019-07-30 | Disposition: A | Discharge status: Home / Self Care | Payer: BC Managed Care – PPO | Source: Ambulatory Visit | Attending: Internal Medicine | Admitting: Internal Medicine

## 2019-07-30 DIAGNOSIS — I639 Cerebral infarction, unspecified: Secondary | ICD-10-CM

## 2019-08-01 ENCOUNTER — Other Ambulatory Visit: Payer: Self-pay

## 2019-08-01 ENCOUNTER — Ambulatory Visit (HOSPITAL_COMMUNITY): Payer: BC Managed Care – PPO | Attending: Cardiology

## 2019-08-01 DIAGNOSIS — I639 Cerebral infarction, unspecified: Secondary | ICD-10-CM | POA: Insufficient documentation

## 2019-08-02 ENCOUNTER — Ambulatory Visit
Admission: RE | Admit: 2019-08-02 | Discharge: 2019-08-02 | Disposition: A | Discharge status: Home / Self Care | Payer: BC Managed Care – PPO | Source: Ambulatory Visit | Attending: Internal Medicine | Admitting: Internal Medicine

## 2019-08-02 DIAGNOSIS — I639 Cerebral infarction, unspecified: Secondary | ICD-10-CM

## 2019-08-28 ENCOUNTER — Encounter (HOSPITAL_COMMUNITY): Payer: Self-pay | Admitting: Internal Medicine

## 2019-08-28 NOTE — Progress Notes (Signed)
NEUROLOGY CONSULTATION NOTE  Ashlee Hill MRN: [id number] DOB: Jul 10, 1961  Referring provider: Seward Carol, MD Primary care provider: Seward Carol, MD  Reason for consult:  CVA  HISTORY OF PRESENT ILLNESS: Ashlee Hill is a 59 year old right-handed white female with hypertension, diabetes and history of breast cancer who presents for recent CVA.  History supplemented by referring provider note.  She woke up one morning in mid-November with severe vertigo.  She was extremely unsteady on her feet.  It lasted about 2 to 3 days and started to resolve.  No associated double vision, slurred speech, or unilateral numbness or weakness.  MRI of brain without contrast from 07/23/2019 was personally reviewed and demonstrated subacute infarct in the right paramedian pons, as well as chronic small vessel ischemic changes within the pons and cerebral hemispheres.  Follow up MRA of head on 07/30/2019 was personally reviewed and demonstrated significant intracranial atherosclerotic disease with multifocal stenoses, including high-grade focal stenosis proximal M1 and moderate stenosis distal M1 left MCA, moderate stenosis proximal right PICA, and high-grade stenosis mid P2 right PCA.  Incidentally, possible 1-2 mm aneurysm may be visualized arising from left cavernous ICA.  Carotid ultrasound on 08/02/2019 demonstrated mild atherosclerosis without hemodynamically significant bilateral ICA stenosis and with antegrade vertebral flow bilaterally. Echocardiogram with bubble study showed EF 65-70% with no cardiac source of embolus.  Hgb A1c was 8.  Cholesterol was reportedly high.  She was on ASA, which was switched to Plavix.  Atorvastatin 10mg  (higher doses caused leg pain and weakness) was changed to Crestor.   She does not smoke.  She does not exercise.  She has 1 to 2 glasses of wine a day.  Current medications:  Plavix; Crestor 20mg , metoprolol, HCTZ, metformin  PAST MEDICAL HISTORY: Past Medical  History:  Diagnosis Date  . Breast cancer ([place])   . Breast cancer of upper-inner quadrant of left female breast ([place]) 10/01/2014  . Diabetes mellitus without complication ([place]) 99991111   chemo caused diabetes per pt   . Family history of breast cancer   . Hypertension   . Wears glasses    driving    PAST SURGICAL HISTORY: Past Surgical History:  Procedure Laterality Date  . Catarct Extracts Bilateral  08-11-15   Right Eye  . Left Eye Cayract Extraction  08-18-15   Left Eye  . Port-A-Cath Removed  08-18-15  . PORTACATH PLACEMENT Right 10/21/2014   Procedure: INSERTION PORT-A-CATH;  Surgeon: Erroll Luna, MD;  Location: [facility];  Service: General;  Laterality: Right;  . TONSILLECTOMY    . WISDOM TOOTH EXTRACTION      MEDICATIONS: Current Outpatient Medications on File Prior to Visit  Medication Sig Dispense Refill  . ALPRAZolam (XANAX) 1 MG tablet Take 1-2 mg by mouth 2 (two) times daily. Take 1 tablet (1 mg) in the morning & Take 2 tablets (2 mg) in the evening.    Marland Kitchen aspirin 81 MG tablet Take 81 mg by mouth daily. Reported on 08/06/2015    . atorvastatin (LIPITOR) 10 MG tablet Take 10 mg by mouth daily.    . B-D UF III MINI PEN NEEDLES 31G X 5 MM MISC   0  . calcium carbonate (OS-CAL) 600 MG TABS tablet Take 600 mg by mouth daily with breakfast.    . gabapentin (NEURONTIN) 600 MG tablet Take 600 mg by mouth at bedtime. As needed for sleep or anxiety    . hydrochlorothiazide (HYDRODIURIL) 25 MG tablet Take 25 mg  by mouth daily.     . insulin lispro (HUMALOG) 100 UNIT/ML injection Inject into the skin 2 (two) times daily. Sliding scale >150-4 u, >200 8 units, >250-12 units, > 300 16 units,    . LANTUS SOLOSTAR 100 UNIT/ML Solostar Pen Inject 30 Units into the skin at bedtime.   0  . metoprolol (LOPRESSOR) 100 MG tablet Take 100 mg by mouth daily.    . ONE TOUCH ULTRA TEST test strip TEST BLOOD SUGAR AS DIRECTED THREE TIMES A DAY  0  . ONETOUCH DELICA LANCETS 99991111  MISC use to STICK FINGER three times a day as directed  0  . venlafaxine XR (EFFEXOR-XR) 150 MG 24 hr capsule Take 150 mg by mouth daily with breakfast.    . zolpidem (AMBIEN) 10 MG tablet Take 10 mg by mouth at bedtime as needed for sleep. Reported on 09/17/2015     No current facility-administered medications on file prior to visit.    ALLERGIES: Allergies  Allergen Reactions  . Tegaderm Ag Mesh [Silver] Dermatitis    tegaderm film    FAMILY HISTORY: Family History  Problem Relation Age of Onset  . Prostate cancer Father 67       currently 47  . Breast cancer Paternal Aunt 98       deceased 60  . Lung cancer Maternal Grandfather 63       smoker; deceased  . Thyroid cancer Cousin 25       pat first cousin; currently 33   SOCIAL HISTORY: Social History   Socioeconomic History  . Marital status: Single    Spouse name: Not on file  . Number of children: Not on file  . Years of education: Not on file  . Highest education level: Not on file  Occupational History  . Not on file  Tobacco Use  . Smoking status: Never Smoker  . Smokeless tobacco: Never Used  Substance and Sexual Activity  . Alcohol use: Yes    Alcohol/week: 1.0 standard drinks    Types: 1 Glasses of wine per week    Comment: socail drinker  . Drug use: No  . Sexual activity: Not on file  Other Topics Concern  . Not on file  Social History Narrative  . Not on file   Social Determinants of Health   Financial Resource Strain:   . Difficulty of Paying Living Expenses: Not on file  Food Insecurity:   . Worried About Charity fundraiser in the Last Year: Not on file  . Ran Out of Food in the Last Year: Not on file  Transportation Needs:   . Lack of Transportation (Medical): Not on file  . Lack of Transportation (Non-Medical): Not on file  Physical Activity:   . Days of Exercise per Week: Not on file  . Minutes of Exercise per Session: Not on file  Stress:   . Feeling of Stress : Not on file  Social  Connections:   . Frequency of Communication with Friends and Family: Not on file  . Frequency of Social Gatherings with Friends and Family: Not on file  . Attends Religious Services: Not on file  . Active Member of Clubs or Organizations: Not on file  . Attends Archivist Meetings: Not on file  . Marital Status: Not on file  Intimate Partner Violence:   . Fear of Current or Ex-Partner: Not on file  . Emotionally Abused: Not on file  . Physically Abused: Not on file  .  Sexually Abused: Not on file    REVIEW OF SYSTEMS: Constitutional: No fevers, chills, or sweats, no generalized fatigue, change in appetite Eyes: No visual changes, double vision, eye pain Ear, nose and throat: No hearing loss, ear pain, nasal congestion, sore throat Cardiovascular: No chest pain, palpitations Respiratory:  No shortness of breath at rest or with exertion, wheezes GastrointestinaI: No nausea, vomiting, diarrhea, abdominal pain, fecal incontinence Genitourinary:  No dysuria, urinary retention or frequency Musculoskeletal:  No neck pain, back pain Integumentary: No rash, pruritus, skin lesions Neurological: as above Psychiatric: No depression, insomnia, anxiety Endocrine: No palpitations, fatigue, diaphoresis, mood swings, change in appetite, change in weight, increased thirst Hematologic/Lymphatic:  No purpura, petechiae. Allergic/Immunologic: no itchy/runny eyes, nasal congestion, recent allergic reactions, rashes  PHYSICAL EXAM: Blood pressure 107/76, pulse 90, height 5\' 5"  (1.651 m), weight 224 lb (101.6 kg), SpO2 99 %. General: No acute distress.  Patient appears well-groomed.  Head:  Normocephalic/atraumatic Eyes:  fundi examined but not visualized Neck: supple, no paraspinal tenderness, full range of motion Back: No paraspinal tenderness Heart: regular rate and rhythm Lungs: Clear to auscultation bilaterally. Vascular: No carotid bruits. Neurological Exam: Mental status: alert and  oriented to person, place, and time, recent and remote memory intact, fund of knowledge intact, attention and concentration intact, speech fluent and not dysarthric, language intact. Cranial nerves: CN I: not tested CN II: pupils equal, round and reactive to light, visual fields intact CN III, IV, VI:  full range of motion, no nystagmus, no ptosis CN V: facial sensation intact CN VII: upper and lower face symmetric CN VIII: hearing intact CN IX, X: gag intact, uvula midline CN XI: sternocleidomastoid and trapezius muscles intact CN XII: tongue midline Bulk & Tone: normal, no fasciculations. Motor:  5/5 throughout  Sensation:  Pinprick and vibration sensation intact. Deep Tendon Reflexes:  2+ throughout, toes downgoing.  Finger to nose testing:  Without dysmetria.  Heel to shin:  Without dysmetria.  Gait:  Slightly side-bent to right.  Able to turn. Romberg with mild sway.  IMPRESSION: 1.  Pontine infarct, likely secondary to small vessel disease. 2.  Intracranial arterial stenosis/atherosclerotic disease 3.  Hypertension 4.  Hyperlipidemia 5.  Type 2 diabetes daily 6.  Alcohol use   PLAN: 1.  Plavix 75mg  daily for secondary stroke prevention 2.  Crestor 20mg  daily (LDL goal less than 70.  Repeat lipid panel in 3 months). 3.  Optimize blood pressure and glycemic control 4.  Mediterranean diet 5.  Cardiovascular exercise 6.  Limit alcohol to one drink a day 7.  Follow up in 3 to 4 months.  Thank you for allowing me to take part in the care of this patient.  Metta Clines, DO  CC: Seward Carol, MD

## 2019-08-29 ENCOUNTER — Other Ambulatory Visit: Payer: Self-pay

## 2019-08-29 ENCOUNTER — Ambulatory Visit (INDEPENDENT_AMBULATORY_CARE_PROVIDER_SITE_OTHER): Payer: BC Managed Care – PPO | Admitting: Neurology

## 2019-08-29 ENCOUNTER — Encounter: Payer: Self-pay | Admitting: Neurology

## 2019-08-29 VITALS — BP 107/76 | HR 90 | Ht 65.0 in | Wt 224.0 lb

## 2019-08-29 DIAGNOSIS — E669 Obesity, unspecified: Secondary | ICD-10-CM

## 2019-08-29 DIAGNOSIS — E785 Hyperlipidemia, unspecified: Secondary | ICD-10-CM | POA: Diagnosis not present

## 2019-08-29 DIAGNOSIS — E1169 Type 2 diabetes mellitus with other specified complication: Secondary | ICD-10-CM | POA: Diagnosis not present

## 2019-08-29 DIAGNOSIS — I6350 Cerebral infarction due to unspecified occlusion or stenosis of unspecified cerebral artery: Secondary | ICD-10-CM

## 2019-08-29 DIAGNOSIS — I10 Essential (primary) hypertension: Secondary | ICD-10-CM

## 2019-08-29 NOTE — Patient Instructions (Signed)
1.  Continue Plavix 75mg  daily 2.  Continue Crestor 3.  Continue blood pressure and diabetes medication 4.  Follow up Mediterranean diet (see below) 5.  Increase cardiovascular exercise 6.  Limit alcohol to no more than 1 drink a day 7.  Follow up in 3 to 4 months.   Mediterranean Diet A Mediterranean diet refers to food and lifestyle choices that are based on the traditions of countries located on the [employer]. This way of eating has been shown to help prevent certain conditions and improve outcomes for people who have chronic diseases, like kidney disease and heart disease. What are tips for following this plan? Lifestyle  Cook and eat meals together with your family, when possible.  Drink enough fluid to keep your urine clear or pale yellow.  Be physically active every day. This includes: ? Aerobic exercise like running or swimming. ? Leisure activities like gardening, walking, or housework.  Get 7-8 hours of sleep each night.  If recommended by your health care provider, drink red wine in moderation. This means 1 glass a day for nonpregnant women and 2 glasses a day for men. A glass of wine equals 5 oz (150 mL). Reading food labels   Check the serving size of packaged foods. For foods such as rice and pasta, the serving size refers to the amount of cooked product, not dry.  Check the total fat in packaged foods. Avoid foods that have saturated fat or trans fats.  Check the ingredients list for added sugars, such as corn syrup. Shopping  At the grocery store, buy most of your food from the areas near the walls of the store. This includes: ? Fresh fruits and vegetables (produce). ? Grains, beans, nuts, and seeds. Some of these may be available in unpackaged forms or large amounts (in bulk). ? Fresh seafood. ? Poultry and eggs. ? Low-fat dairy products.  Buy whole ingredients instead of prepackaged foods.  Buy fresh fruits and vegetables in-season from local  farmers markets.  Buy frozen fruits and vegetables in resealable bags.  If you do not have access to quality fresh seafood, buy precooked frozen shrimp or canned fish, such as tuna, salmon, or sardines.  Buy small amounts of raw or cooked vegetables, salads, or olives from the deli or salad bar at your store.  Stock your pantry so you always have certain foods on hand, such as olive oil, canned tuna, canned tomatoes, rice, pasta, and beans. Cooking  Cook foods with extra-virgin olive oil instead of using butter or other vegetable oils.  Have meat as a side dish, and have vegetables or grains as your main dish. This means having meat in small portions or adding small amounts of meat to foods like pasta or stew.  Use beans or vegetables instead of meat in common dishes like chili or lasagna.  Experiment with different cooking methods. Try roasting or broiling vegetables instead of steaming or sauteing them.  Add frozen vegetables to soups, stews, pasta, or rice.  Add nuts or seeds for added healthy fat at each meal. You can add these to yogurt, salads, or vegetable dishes.  Marinate fish or vegetables using olive oil, lemon juice, garlic, and fresh herbs. Meal planning   Plan to eat 1 vegetarian meal one day each week. Try to work up to 2 vegetarian meals, if possible.  Eat seafood 2 or more times a week.  Have healthy snacks readily available, such as: ? Vegetable sticks with hummus. ? [place] yogurt. ?  Fruit and nut trail mix.  Eat balanced meals throughout the week. This includes: ? Fruit: 2-3 servings a day ? Vegetables: 4-5 servings a day ? Low-fat dairy: 2 servings a day ? Fish, poultry, or lean meat: 1 serving a day ? Beans and legumes: 2 or more servings a week ? Nuts and seeds: 1-2 servings a day ? Whole grains: 6-8 servings a day ? Extra-virgin olive oil: 3-4 servings a day  Limit red meat and sweets to only a few servings a month What are my food  choices?  Mediterranean diet ? Recommended  Grains: Whole-grain pasta. Brown rice. Bulgar wheat. Polenta. Couscous. Whole-wheat bread. [place].  Vegetables: Artichokes. Beets. Broccoli. Cabbage. Carrots. Eggplant. Green beans. Chard. Kale. Spinach. Onions. Leeks. Peas. Squash. Tomatoes. Peppers. Radishes.  Fruits: Apples. Apricots. Avocado. Berries. Bananas. Cherries. Dates. Figs. Grapes. Lemons. Melon. Oranges. Peaches. Plums. Pomegranate.  Meats and other protein foods: Beans. Almonds. Sunflower seeds. Pine nuts. Peanuts. [place]. Salmon. Scallops. Shrimp. [place]. Tilapia. Clams. Oysters. Eggs.  Dairy: Low-fat milk. Cheese. Greek yogurt.  Beverages: Water. Red wine. Herbal tea.  Fats and oils: Extra virgin olive oil. Avocado oil. Grape seed oil.  Sweets and desserts: [place] yogurt with honey. Baked apples. Poached pears. Trail mix.  Seasoning and other foods: Basil. Cilantro. Coriander. Cumin. Mint. Parsley. Sage. Rosemary. Tarragon. Garlic. Oregano. Thyme. Pepper. Balsalmic vinegar. Tahini. Hummus. Tomato sauce. Olives. Mushrooms. ? Limit these  Grains: Prepackaged pasta or rice dishes. Prepackaged cereal with added sugar.  Vegetables: Deep fried potatoes (french fries).  Fruits: Fruit canned in syrup.  Meats and other protein foods: Beef. Pork. Lamb. Poultry with skin. Hot dogs. Bernie[place] Salines.  Dairy: Ice cream. Sour cream. Whole milk.  Beverages: Juice. Sugar-sweetened soft drinks. Beer. Liquor and spirits.  Fats and oils: Butter. Canola oil. Vegetable oil. Beef fat (tallow). Lard.  Sweets and desserts: Cookies. Cakes. Pies. Candy.  Seasoning and other foods: Mayonnaise. Premade sauces and marinades. The items listed may not be a complete list. Talk with your dietitian about what dietary choices are right for you. Summary  The Mediterranean diet includes both food and lifestyle choices.  Eat a variety of fresh fruits and vegetables, beans, nuts, seeds, and whole  grains.  Limit the amount of red meat and sweets that you eat.  Talk with your health care provider about whether it is safe for you to drink red wine in moderation. This means 1 glass a day for nonpregnant women and 2 glasses a day for men. A glass of wine equals 5 oz (150 mL). This information is not intended to replace advice given to you by your health care provider. Make sure you discuss any questions you have with your health care provider. Document Revised: 04/07/2016 Document Reviewed: 03/31/2016 Elsevier Patient Education  [place].

## 2019-12-19 NOTE — Progress Notes (Deleted)
NEUROLOGY FOLLOW UP OFFICE NOTE  Ashlee Hill [id number]  HISTORY OF PRESENT ILLNESS: Ashlee Hill is a 59 year old right-handed white female with hypertension, diabetes and history of breast cancer who follows up for stroke.  UPDATE: Current medications:  Plavix 75mg ; Crestor 20mg ; ***  ***  HISTORY: She woke up one morning in mid-November with severe vertigo.  She was extremely unsteady on her feet.  It lasted about 2 to 3 days and started to resolve.  No associated double vision, slurred speech, or unilateral numbness or weakness.  MRI of brain without contrast from 07/23/2019 was personally reviewed and demonstrated subacute infarct in the right paramedian pons, as well as chronic small vessel ischemic changes within the pons and cerebral hemispheres.  Follow up MRA of head on 07/30/2019 was personally reviewed and demonstrated significant intracranial atherosclerotic disease with multifocal stenoses, including high-grade focal stenosis proximal M1 and moderate stenosis distal M1 left MCA, moderate stenosis proximal right PICA, and high-grade stenosis mid P2 right PCA.  Incidentally, possible 1-2 mm aneurysm may be visualized arising from left cavernous ICA.  Carotid ultrasound on 08/02/2019 demonstrated mild atherosclerosis without hemodynamically significant bilateral ICA stenosis and with antegrade vertebral flow bilaterally. Echocardiogram with bubble study showed EF 65-70% with no cardiac source of embolus.  Hgb A1c was 8.  Cholesterol was reportedly high.  She was on ASA, which was switched to Plavix.  Atorvastatin 10mg  (higher doses caused leg pain and weakness) was changed to Crestor.   She does not smoke.  She does not exercise.  She has 1 to 2 glasses of wine a day.  Current medications:  Plavix; Crestor 20mg , metoprolol, HCTZ, metformin  PAST MEDICAL HISTORY: Past Medical History:  Diagnosis Date  . Breast cancer ([place])   . Breast cancer of upper-inner quadrant of  left female breast ([place]) 10/01/2014  . Diabetes mellitus without complication ([place]) 99991111   chemo caused diabetes per pt   . Family history of breast cancer   . Hypertension   . Wears glasses    driving    MEDICATIONS: Current Outpatient Medications on File Prior to Visit  Medication Sig Dispense Refill  . ALPRAZolam (XANAX) 1 MG tablet Take 1-2 mg by mouth 2 (two) times daily. Take 1 tablet (1 mg) in the morning & Take 2 tablets (2 mg) in the evening.    Marland Kitchen aspirin 81 MG tablet Take 81 mg by mouth daily. Reported on 08/06/2015    . atorvastatin (LIPITOR) 10 MG tablet Take 10 mg by mouth daily.    . B-D UF III MINI PEN NEEDLES 31G X 5 MM MISC   0  . calcium carbonate (OS-CAL) 600 MG TABS tablet Take 600 mg by mouth daily with breakfast.    . clopidogrel (PLAVIX) 75 MG tablet Take 75 mg by mouth daily.    Marland Kitchen gabapentin (NEURONTIN) 600 MG tablet Take 600 mg by mouth at bedtime. As needed for sleep or anxiety    . hydrochlorothiazide (HYDRODIURIL) 25 MG tablet Take 25 mg by mouth daily.     . insulin lispro (HUMALOG) 100 UNIT/ML injection Inject into the skin 2 (two) times daily. Sliding scale >150-4 u, >200 8 units, >250-12 units, > 300 16 units,    . LANTUS SOLOSTAR 100 UNIT/ML Solostar Pen Inject 30 Units into the skin at bedtime.   0  . metoprolol (LOPRESSOR) 100 MG tablet Take 100 mg by mouth daily.    . ONE TOUCH ULTRA TEST test strip TEST  BLOOD SUGAR AS DIRECTED THREE TIMES A DAY  0  . ONETOUCH DELICA LANCETS 99991111 MISC use to STICK FINGER three times a day as directed  0  . rosuvastatin (CRESTOR) 40 MG tablet Take 40 mg by mouth daily.    Marland Kitchen venlafaxine XR (EFFEXOR-XR) 150 MG 24 hr capsule Take 150 mg by mouth daily with breakfast.    . zolpidem (AMBIEN) 10 MG tablet Take 10 mg by mouth at bedtime as needed for sleep. Reported on 09/17/2015     No current facility-administered medications on file prior to visit.    ALLERGIES: Allergies  Allergen Reactions  . Tegaderm Ag Mesh  [Silver] Dermatitis    tegaderm film    FAMILY HISTORY: Family History  Problem Relation Age of Onset  . Prostate cancer Father 68       currently 75  . Breast cancer Paternal Aunt 78       deceased 71  . Lung cancer Maternal Grandfather 7       smoker; deceased  . Thyroid cancer Cousin 25       pat first cousin; currently 17   SOCIAL HISTORY: Social History   Socioeconomic History  . Marital status: Single    Spouse name: Not on file  . Number of children: Not on file  . Years of education: Not on file  . Highest education level: Not on file  Occupational History  . Occupation: retired  Tobacco Use  . Smoking status: Never Smoker  . Smokeless tobacco: Never Used  Substance and Sexual Activity  . Alcohol use: Yes    Alcohol/week: 1.0 standard drinks    Types: 1 Glasses of wine per week    Comment: socail drinker  . Drug use: No  . Sexual activity: Not on file  Other Topics Concern  . Not on file  Social History Narrative   Right handed   One story apartment   No caffeine   Social Determinants of Health   Financial Resource Strain:   . Difficulty of Paying Living Expenses:   Food Insecurity:   . Worried About Charity fundraiser in the Last Year:   . Arboriculturist in the Last Year:   Transportation Needs:   . Film/video editor (Medical):   Marland Kitchen Lack of Transportation (Non-Medical):   Physical Activity:   . Days of Exercise per Week:   . Minutes of Exercise per Session:   Stress:   . Feeling of Stress :   Social Connections:   . Frequency of Communication with Friends and Family:   . Frequency of Social Gatherings with Friends and Family:   . Attends Religious Services:   . Active Member of Clubs or Organizations:   . Attends Archivist Meetings:   Marland Kitchen Marital Status:   Intimate Partner Violence:   . Fear of Current or Ex-Partner:   . Emotionally Abused:   Marland Kitchen Physically Abused:   . Sexually Abused:     REVIEW OF  SYSTEMS: Constitutional: No fevers, chills, or sweats, no generalized fatigue, change in appetite Eyes: No visual changes, double vision, eye pain Ear, nose and throat: No hearing loss, ear pain, nasal congestion, sore throat Cardiovascular: No chest pain, palpitations Respiratory:  No shortness of breath at rest or with exertion, wheezes GastrointestinaI: No nausea, vomiting, diarrhea, abdominal pain, fecal incontinence Genitourinary:  No dysuria, urinary retention or frequency Musculoskeletal:  No neck pain, back pain Integumentary: No rash, pruritus, skin lesions Neurological: as above  Psychiatric: No depression, insomnia, anxiety Endocrine: No palpitations, fatigue, diaphoresis, mood swings, change in appetite, change in weight, increased thirst Hematologic/Lymphatic:  No purpura, petechiae. Allergic/Immunologic: no itchy/runny eyes, nasal congestion, recent allergic reactions, rashes  PHYSICAL EXAM: *** General: No acute distress.  Patient appears well-groomed.   Head:  Normocephalic/atraumatic Eyes:  Fundi examined but not visualized Neck: supple, no paraspinal tenderness, full range of motion Heart:  Regular rate and rhythm Lungs:  Clear to auscultation bilaterally Back: No paraspinal tenderness Neurological Exam: alert and oriented to person, place, and time. Attention span and concentration intact, recent and remote memory intact, fund of knowledge intact.  Speech fluent and not dysarthric, language intact.  CN II-XII intact. Bulk and tone normal, muscle strength 5/5 throughout.  Sensation to light touch, temperature and vibration intact.  Deep tendon reflexes 2+ throughout, toes downgoing.  Finger to nose and heel to shin testing intact.  Gait slightly side-bent to right. Romberg negative.  IMPRESSION: 1. Pontine infarct secondary to small vessel disease 2.  Intracranial atherosclerosis/stenosis 3.  Hypertension 4.  Hyperlipidemia 5.  Type 2 diabetes mellitus 6.  Alcohol  use  PLAN: 1.  Plavix 75mg  daily for secondary stroke prevention. 2.  ***  Metta Clines, DO  CC: ***

## 2019-12-20 ENCOUNTER — Ambulatory Visit: Payer: BC Managed Care – PPO | Admitting: Neurology

## 2020-01-02 ENCOUNTER — Other Ambulatory Visit: Payer: Self-pay | Admitting: Internal Medicine

## 2020-01-02 ENCOUNTER — Ambulatory Visit
Admission: RE | Admit: 2020-01-02 | Discharge: 2020-01-02 | Disposition: A | Discharge status: Home / Self Care | Payer: BC Managed Care – PPO | Source: Ambulatory Visit | Attending: Internal Medicine | Admitting: Internal Medicine

## 2020-01-02 DIAGNOSIS — M25511 Pain in right shoulder: Secondary | ICD-10-CM

## 2020-01-22 ENCOUNTER — Other Ambulatory Visit: Payer: Self-pay

## 2020-01-22 ENCOUNTER — Emergency Department (HOSPITAL_COMMUNITY): Admission: EM | Admit: 2020-01-22 | Discharge: 2020-01-22 | Payer: BC Managed Care – PPO

## 2020-03-04 ENCOUNTER — Ambulatory Visit: Payer: BC Managed Care – PPO | Admitting: Neurology

## 2021-05-08 ENCOUNTER — Inpatient Hospital Stay (HOSPITAL_COMMUNITY)
Admission: EM | Admit: 2021-05-08 | Discharge: 2021-05-10 | DRG: 684 | Disposition: A | Discharge status: Home / Self Care | Payer: 59 | Attending: Internal Medicine | Admitting: Internal Medicine

## 2021-05-08 ENCOUNTER — Encounter (HOSPITAL_COMMUNITY): Payer: Self-pay

## 2021-05-08 ENCOUNTER — Inpatient Hospital Stay (HOSPITAL_COMMUNITY): Payer: 59

## 2021-05-08 ENCOUNTER — Other Ambulatory Visit: Payer: Self-pay

## 2021-05-08 DIAGNOSIS — A084 Viral intestinal infection, unspecified: Secondary | ICD-10-CM | POA: Diagnosis present

## 2021-05-08 DIAGNOSIS — E876 Hypokalemia: Secondary | ICD-10-CM | POA: Diagnosis present

## 2021-05-08 DIAGNOSIS — Z7902 Long term (current) use of antithrombotics/antiplatelets: Secondary | ICD-10-CM

## 2021-05-08 DIAGNOSIS — Z803 Family history of malignant neoplasm of breast: Secondary | ICD-10-CM

## 2021-05-08 DIAGNOSIS — N179 Acute kidney failure, unspecified: Principal | ICD-10-CM | POA: Diagnosis present

## 2021-05-08 DIAGNOSIS — Z853 Personal history of malignant neoplasm of breast: Secondary | ICD-10-CM | POA: Diagnosis not present

## 2021-05-08 DIAGNOSIS — E785 Hyperlipidemia, unspecified: Secondary | ICD-10-CM | POA: Diagnosis present

## 2021-05-08 DIAGNOSIS — E119 Type 2 diabetes mellitus without complications: Secondary | ICD-10-CM | POA: Diagnosis present

## 2021-05-08 DIAGNOSIS — I10 Essential (primary) hypertension: Secondary | ICD-10-CM | POA: Diagnosis present

## 2021-05-08 DIAGNOSIS — Z66 Do not resuscitate: Secondary | ICD-10-CM | POA: Diagnosis present

## 2021-05-08 DIAGNOSIS — I959 Hypotension, unspecified: Secondary | ICD-10-CM | POA: Diagnosis present

## 2021-05-08 DIAGNOSIS — F419 Anxiety disorder, unspecified: Secondary | ICD-10-CM | POA: Diagnosis present

## 2021-05-08 DIAGNOSIS — Z7982 Long term (current) use of aspirin: Secondary | ICD-10-CM

## 2021-05-08 DIAGNOSIS — R197 Diarrhea, unspecified: Secondary | ICD-10-CM

## 2021-05-08 DIAGNOSIS — Z8673 Personal history of transient ischemic attack (TIA), and cerebral infarction without residual deficits: Secondary | ICD-10-CM | POA: Diagnosis not present

## 2021-05-08 DIAGNOSIS — Z20822 Contact with and (suspected) exposure to covid-19: Secondary | ICD-10-CM | POA: Diagnosis present

## 2021-05-08 DIAGNOSIS — Z794 Long term (current) use of insulin: Secondary | ICD-10-CM

## 2021-05-08 DIAGNOSIS — Z79899 Other long term (current) drug therapy: Secondary | ICD-10-CM | POA: Diagnosis not present

## 2021-05-08 DIAGNOSIS — D6489 Other specified anemias: Secondary | ICD-10-CM | POA: Diagnosis present

## 2021-05-08 DIAGNOSIS — Z801 Family history of malignant neoplasm of trachea, bronchus and lung: Secondary | ICD-10-CM | POA: Diagnosis not present

## 2021-05-08 DIAGNOSIS — Z808 Family history of malignant neoplasm of other organs or systems: Secondary | ICD-10-CM | POA: Diagnosis not present

## 2021-05-08 DIAGNOSIS — D649 Anemia, unspecified: Secondary | ICD-10-CM

## 2021-05-08 DIAGNOSIS — Z23 Encounter for immunization: Secondary | ICD-10-CM

## 2021-05-08 LAB — CBC WITH DIFFERENTIAL/PLATELET
Abs Immature Granulocytes: 0.04 10*3/uL (ref 0.00–0.07)
Basophils Absolute: 0 10*3/uL (ref 0.0–0.1)
Basophils Relative: 0 %
Eosinophils Absolute: 0.2 10*3/uL (ref 0.0–0.5)
Eosinophils Relative: 2 %
HCT: 29.4 % — ABNORMAL LOW (ref 36.0–46.0)
Hemoglobin: 10 g/dL — ABNORMAL LOW (ref 12.0–15.0)
Immature Granulocytes: 0 %
Lymphocytes Relative: 16 %
Lymphs Abs: 1.4 10*3/uL (ref 0.7–4.0)
MCH: 32.3 pg (ref 26.0–34.0)
MCHC: 34 g/dL (ref 30.0–36.0)
MCV: 94.8 fL (ref 80.0–100.0)
Monocytes Absolute: 1 10*3/uL (ref 0.1–1.0)
Monocytes Relative: 11 %
Neutro Abs: 6.3 10*3/uL (ref 1.7–7.7)
Neutrophils Relative %: 71 %
Platelets: 227 10*3/uL (ref 150–400)
RBC: 3.1 MIL/uL — ABNORMAL LOW (ref 3.87–5.11)
RDW: 13 % (ref 11.5–15.5)
WBC: 9 10*3/uL (ref 4.0–10.5)
nRBC: 0 % (ref 0.0–0.2)

## 2021-05-08 LAB — URINALYSIS, ROUTINE W REFLEX MICROSCOPIC
Bilirubin Urine: NEGATIVE
Glucose, UA: 50 mg/dL — AB
Ketones, ur: NEGATIVE mg/dL
Nitrite: NEGATIVE
Protein, ur: NEGATIVE mg/dL
Specific Gravity, Urine: 1.006 (ref 1.005–1.030)
pH: 5 (ref 5.0–8.0)

## 2021-05-08 LAB — HEMOGLOBIN A1C
Hgb A1c MFr Bld: 10.5 % — ABNORMAL HIGH (ref 4.8–5.6)
Mean Plasma Glucose: 254.65 mg/dL

## 2021-05-08 LAB — RESP PANEL BY RT-PCR (FLU A&B, COVID) ARPGX2
Influenza A by PCR: NEGATIVE
Influenza B by PCR: NEGATIVE
SARS Coronavirus 2 by RT PCR: NEGATIVE

## 2021-05-08 LAB — CBG MONITORING, ED
Glucose-Capillary: 190 mg/dL — ABNORMAL HIGH (ref 70–99)
Glucose-Capillary: 147 mg/dL — ABNORMAL HIGH (ref 70–99)

## 2021-05-08 LAB — COMPREHENSIVE METABOLIC PANEL
ALT: 16 U/L (ref 0–44)
AST: 15 U/L (ref 15–41)
Albumin: 2.9 g/dL — ABNORMAL LOW (ref 3.5–5.0)
Alkaline Phosphatase: 102 U/L (ref 38–126)
Anion gap: 11 (ref 5–15)
BUN: 44 mg/dL — ABNORMAL HIGH (ref 6–20)
CO2: 25 mmol/L (ref 22–32)
Calcium: 8.3 mg/dL — ABNORMAL LOW (ref 8.9–10.3)
Chloride: 99 mmol/L (ref 98–111)
Creatinine, Ser: 2.61 mg/dL — ABNORMAL HIGH (ref 0.44–1.00)
GFR, Estimated: 21 mL/min — ABNORMAL LOW (ref >60)
Glucose, Bld: 201 mg/dL — ABNORMAL HIGH (ref 70–99)
Potassium: 3.3 mmol/L — ABNORMAL LOW (ref 3.5–5.1)
Sodium: 135 mmol/L (ref 135–145)
Total Bilirubin: 0.6 mg/dL (ref 0.3–1.2)
Total Protein: 6.1 g/dL — ABNORMAL LOW (ref 6.5–8.1)

## 2021-05-08 LAB — MAGNESIUM: Magnesium: 2.1 mg/dL (ref 1.7–2.4)

## 2021-05-08 LAB — GLUCOSE, CAPILLARY: Glucose-Capillary: 166 mg/dL — ABNORMAL HIGH (ref 70–99)

## 2021-05-08 MED ORDER — ALPRAZOLAM 0.5 MG PO TABS: 1.0000 mg | ORAL_TABLET | Freq: Two times a day (BID) | ORAL | Status: DC

## 2021-05-08 MED ORDER — LACTATED RINGERS IV BOLUS
1000.0000 mL | Freq: Once | INTRAVENOUS | Status: AC
  Administered 2021-05-08: 1000 mL via INTRAVENOUS

## 2021-05-08 MED ORDER — INSULIN ASPART 100 UNIT/ML IJ SOLN
0.0000 [IU] | Freq: Every day | INTRAMUSCULAR | Status: DC
  Administered 2021-05-09: 4 [IU] via SUBCUTANEOUS
  Filled 2021-05-08: qty 0.05

## 2021-05-08 MED ORDER — INSULIN ASPART 100 UNIT/ML IJ SOLN
0.0000 [IU] | Freq: Three times a day (TID) | INTRAMUSCULAR | Status: DC
  Administered 2021-05-08: 2 [IU] via SUBCUTANEOUS
  Administered 2021-05-09: 3 [IU] via SUBCUTANEOUS
  Administered 2021-05-09: 8 [IU] via SUBCUTANEOUS
  Administered 2021-05-09: 5 [IU] via SUBCUTANEOUS
  Administered 2021-05-10: 8 [IU] via SUBCUTANEOUS
  Administered 2021-05-10: 5 [IU] via SUBCUTANEOUS
  Filled 2021-05-08: qty 0.15

## 2021-05-08 MED ORDER — ROSUVASTATIN CALCIUM 20 MG PO TABS
40.0000 mg | ORAL_TABLET | Freq: Every day | ORAL | Status: DC
  Administered 2021-05-09 – 2021-05-10 (×2): 40 mg via ORAL
  Filled 2021-05-08 (×2): qty 2

## 2021-05-08 MED ORDER — LACTATED RINGERS IV SOLN: INTRAVENOUS | Status: DC

## 2021-05-08 MED ORDER — TIZANIDINE HCL 4 MG PO TABS
8.0000 mg | ORAL_TABLET | Freq: Every day | ORAL | Status: DC
  Administered 2021-05-08 – 2021-05-09 (×2): 8 mg via ORAL
  Filled 2021-05-08 (×2): qty 2

## 2021-05-08 MED ORDER — VENLAFAXINE HCL ER 75 MG PO CP24
150.0000 mg | ORAL_CAPSULE | Freq: Every day | ORAL | Status: DC
  Administered 2021-05-09 – 2021-05-10 (×2): 150 mg via ORAL
  Filled 2021-05-08 (×2): qty 2

## 2021-05-08 MED ORDER — POTASSIUM CHLORIDE CRYS ER 20 MEQ PO TBCR
40.0000 meq | EXTENDED_RELEASE_TABLET | Freq: Every day | ORAL | Status: DC
  Administered 2021-05-08 – 2021-05-10 (×3): 40 meq via ORAL
  Filled 2021-05-08 (×3): qty 2

## 2021-05-08 MED ORDER — CLOPIDOGREL BISULFATE 75 MG PO TABS
75.0000 mg | ORAL_TABLET | Freq: Every day | ORAL | Status: DC
  Administered 2021-05-09 – 2021-05-10 (×2): 75 mg via ORAL
  Filled 2021-05-08 (×2): qty 1

## 2021-05-08 MED ORDER — ONDANSETRON HCL 4 MG PO TABS: 4.0000 mg | ORAL_TABLET | Freq: Four times a day (QID) | ORAL | Status: DC | PRN

## 2021-05-08 MED ORDER — ALPRAZOLAM 1 MG PO TABS
1.0000 mg | ORAL_TABLET | Freq: Every day | ORAL | Status: DC
  Administered 2021-05-09 – 2021-05-10 (×2): 1 mg via ORAL
  Filled 2021-05-08 (×2): qty 1

## 2021-05-08 MED ORDER — ONDANSETRON HCL 4 MG/2ML IJ SOLN: 4.0000 mg | Freq: Four times a day (QID) | INTRAMUSCULAR | Status: DC | PRN

## 2021-05-08 MED ORDER — ALPRAZOLAM 1 MG PO TABS
2.0000 mg | ORAL_TABLET | Freq: Every day | ORAL | Status: DC
  Administered 2021-05-08 – 2021-05-09 (×2): 2 mg via ORAL
  Filled 2021-05-08 (×2): qty 2

## 2021-05-08 NOTE — ED Provider Notes (Signed)
[facility] DEPT Provider Note   CSN: [id number] Arrival date & time: 05/08/21  1136     History Chief Complaint  Patient presents with   Near Syncope   Hypotension    Ashlee Hill is a 60 y.o. female.  60 year old female presents with syncopal episode as well as low blood pressure.  States that she has had diarrhea for about 10 days which she describes as watery and nonbloody or dark.  No abdominal pain no fever.  No emesis.  Attempted drive her car but felt weak and called 911.  EMS was notified and BP was 63/34.  Given 1 L bolus saline.  Patient does take metoprolol and losartan and did take those doses today.      Past Medical History:  Diagnosis Date   Breast cancer ([place])    Breast cancer of upper-inner quadrant of left female breast ([place]) 10/01/2014   Diabetes mellitus without complication ([place]) 99991111   chemo caused diabetes per pt    Family history of breast cancer    Hypertension    Wears glasses    driving    Patient Active Problem List   Diagnosis Date Noted   Diabetes mellitus type 2 in obese ([place]) 06/26/2016   Dehydration 12/04/2014   Nausea with vomiting 12/04/2014   Anorexia 12/04/2014   Hyperglycemia 12/04/2014   Renal insufficiency 12/04/2014   Hyperbilirubinemia 12/04/2014   Antineoplastic chemotherapy induced anemia 12/04/2014   Skin lesion 12/04/2014   Genetic testing 11/14/2014   Family history of breast cancer    Breast cancer of upper-inner quadrant of left female breast ([place]) 10/01/2014    Past Surgical History:  Procedure Laterality Date   Catarct Extracts Bilateral  08-11-15   Right Eye   Left Eye Cayract Extraction  08-18-15   Left Eye   Port-A-Cath Removed  08-18-15   PORTACATH PLACEMENT Right 10/21/2014   Procedure: INSERTION PORT-A-CATH;  Surgeon: Erroll Luna, MD;  Location: [facility];  Service: General;  Laterality: Right;   TONSILLECTOMY     WISDOM TOOTH EXTRACTION        OB History   No obstetric history on file.     Family History  Problem Relation Age of Onset   Prostate cancer Father 79       currently 24   Breast cancer Paternal Aunt 75       deceased 42   Lung cancer Maternal Grandfather 39       smoker; deceased   Thyroid cancer Cousin 10       pat first cousin; currently 9    Social History   Tobacco Use   Smoking status: Never   Smokeless tobacco: Never  Substance Use Topics   Alcohol use: Yes    Alcohol/week: 1.0 standard drink    Types: 1 Glasses of wine per week    Comment: socail drinker   Drug use: No    Home Medications Prior to Admission medications   Medication Sig Start Date End Date Taking? Authorizing Provider  ALPRAZolam Duanne Moron) 1 MG tablet Take 1-2 mg by mouth 2 (two) times daily. Take 1 tablet (1 mg) in the morning & Take 2 tablets (2 mg) in the evening.    [provider]  aspirin 81 MG tablet Take 81 mg by mouth daily. Reported on 08/06/2015    [provider]  atorvastatin (LIPITOR) 10 MG tablet Take 10 mg by mouth daily.    [provider]  B-D  UF III MINI PEN NEEDLES 31G X 5 MM MISC  01/27/15   [provider]  calcium carbonate (OS-CAL) 600 MG TABS tablet Take 600 mg by mouth daily with breakfast.    [provider]  clopidogrel (PLAVIX) 75 MG tablet Take 75 mg by mouth daily. 08/19/19   [provider]  gabapentin (NEURONTIN) 600 MG tablet Take 600 mg by mouth at bedtime. As needed for sleep or anxiety    [provider]  hydrochlorothiazide (HYDRODIURIL) 25 MG tablet Take 25 mg by mouth daily.     [provider]  insulin lispro (HUMALOG) 100 UNIT/ML injection Inject into the skin 2 (two) times daily. Sliding scale >150-4 u, >200 8 units, >250-12 units, > 300 16 units,    [provider]  LANTUS SOLOSTAR 100 UNIT/ML Solostar Pen Inject 30 Units into the skin at bedtime.  12/30/14   [provider]  metoprolol  (LOPRESSOR) 100 MG tablet Take 100 mg by mouth daily.    [provider]  ONE TOUCH ULTRA TEST test strip TEST BLOOD SUGAR AS DIRECTED THREE TIMES A DAY 01/19/15   [provider]  [facility] DELICA LANCETS 99991111 MISC use to [place] three times a day as directed 01/28/15   [provider]  rosuvastatin (CRESTOR) 40 MG tablet Take 40 mg by mouth daily.    [provider]  venlafaxine XR (EFFEXOR-XR) 150 MG 24 hr capsule Take 150 mg by mouth daily with breakfast.    [provider]  zolpidem (AMBIEN) 10 MG tablet Take 10 mg by mouth at bedtime as needed for sleep. Reported on 09/17/2015    [provider]    Allergies    Tegaderm ag mesh [silver]  Review of Systems   Review of Systems  All other systems reviewed and are negative.  Physical Exam Updated Vital Signs BP (!) 87/56 (BP Location: Left Arm)   Pulse 71   Temp 97.7 F (36.5 C) (Oral)   Resp 18   Ht 1.651 m ('5\' 5"'$ )   Wt 99.8 kg   SpO2 96%   BMI 36.61 kg/m   Physical Exam Vitals and nursing note reviewed.  Constitutional:      General: She is not in acute distress.    Appearance: Normal appearance. She is well-developed. She is not toxic-appearing.  HENT:     Head: Normocephalic and atraumatic.  Eyes:     General: Lids are normal.     Conjunctiva/sclera: Conjunctivae normal.     Pupils: Pupils are equal, round, and reactive to light.  Neck:     Thyroid: No thyroid mass.     Trachea: No tracheal deviation.  Cardiovascular:     Rate and Rhythm: Normal rate and regular rhythm.     Heart sounds: Normal heart sounds. No murmur heard.   No gallop.  Pulmonary:     Effort: Pulmonary effort is normal. No respiratory distress.     Breath sounds: Normal breath sounds. No stridor. No decreased breath sounds, wheezing, rhonchi or rales.  Abdominal:     General: There is no distension.     Palpations: Abdomen is soft.     Tenderness: There is no abdominal tenderness. There  is no rebound.  Musculoskeletal:        General: No tenderness. Normal range of motion.     Cervical back: Normal range of motion and neck supple.  Skin:    General: Skin is warm and dry.  Findings: No abrasion or rash.  Neurological:     Mental Status: She is alert and oriented to person, place, and time. Mental status is at baseline.     GCS: GCS eye subscore is 4. GCS verbal subscore is 5. GCS motor subscore is 6.     Cranial Nerves: Cranial nerves are intact. No cranial nerve deficit.     Sensory: No sensory deficit.     Motor: Motor function is intact.  Psychiatric:        Attention and Perception: Attention normal.        Speech: Speech normal.        Behavior: Behavior normal.    ED Results / Procedures / Treatments   Labs (all labs ordered are listed, but only abnormal results are displayed) Labs Reviewed  CBC WITH DIFFERENTIAL/PLATELET  COMPREHENSIVE METABOLIC PANEL  URINALYSIS, ROUTINE W REFLEX MICROSCOPIC  CBG MONITORING, ED    EKG None  Radiology No results found.  Procedures Procedures   Medications Ordered in ED Medications  lactated ringers bolus 1,000 mL (has no administration in time range)  lactated ringers infusion (has no administration in time range)    ED Course  I have reviewed the triage vital signs and the nursing notes.  Pertinent labs & imaging results that were available during my care of the patient were reviewed by me and considered in my medical decision making (see chart for details).    MDM Rules/Calculators/A&P                           Patient hypotensive.  Given IV fluids here.  Found to have evidence of acute kidney injury.  Given IV fluids and blood pressure improved.  Will admit to the hospitalist Final Clinical Impression(s) / ED Diagnoses Final diagnoses:  None    Rx / DC Orders ED Discharge Orders     None        Lacretia Leigh, MD 05/17/21 [id number]

## 2021-05-08 NOTE — ED Triage Notes (Signed)
Pt arrives via [facility] for syncopal episode and hypotension. Pt has had diarrhea ongoing for approximately one week, no abx use or fevers. Pt drove home and felt weak, calling 911. Fire dept reports pt had syncopal episode upon standing. EMS initial BP 63/34. Improved to 80/40 with 1000 mL BOLUS. Pt on metoprolol 100 mg, losartan 25 mg, last doses this morning.   #20 L hand by EMS  EMS last VS - 80/40, HR 67, CBG 114,

## 2021-05-08 NOTE — H&P (Addendum)
History and Physical    Ashlee Hill [id number] DOB: November 29, 1960 DOA: 05/08/2021  PCP: Seward Carol, MD  Patient coming from: home  Chief Complaint: vomiting, diarrhea, weakness  HPI: Ashlee Hill is a 60 y.o. female with medical history significant of DM2, CVA, breast CA, HTN, HLD. Presenting with near syncopal episode. She says that her symptoms started with a 3 day run of nausea and vomiting about 10 days ago. There was no blood in her emesis. She did not associate it with eating, but does note that her appetite had suffered. She took dramamine, which helped. However, her symptoms transitioned in to diarrhea for the last several days. Again, no blood in her stool. She did not try any meds for her diarrhea. She says she was feeling a little better this morning, so she decided to go run some errands. When she returned home, she had difficulty getting out of her car. She was able to open her door and then slid to the grass. There was no LOC or head injury. A neighbor saw her and called for 911.    ED Course: She was found to be hypotensive. Her Scr was elevated at 2.61. She was given fluids. TRH was called for admission.    Review of Systems:  Review of systems is otherwise negative for all not mentioned in HPI.   PMHx Past Medical History:  Diagnosis Date   Breast cancer ([place])    Breast cancer of upper-inner quadrant of left female breast ([place]) 10/01/2014   Diabetes mellitus without complication ([place]) 99991111   chemo caused diabetes per pt    Family history of breast cancer    Hypertension    Wears glasses    driving    PSHx Past Surgical History:  Procedure Laterality Date   Catarct Extracts Bilateral  08-11-15   Right Eye   Left Eye Cayract Extraction  08-18-15   Left Eye   Port-A-Cath Removed  08-18-15   PORTACATH PLACEMENT Right 10/21/2014   Procedure: INSERTION PORT-A-CATH;  Surgeon: Erroll Luna, MD;  Location: [facility];  Service: General;   Laterality: Right;   TONSILLECTOMY     WISDOM TOOTH EXTRACTION      SocHx  reports that she has never smoked. She has never used smokeless tobacco. She reports current alcohol use of about 1.0 standard drink per week. She reports that she does not use drugs.  Allergies  Allergen Reactions   Tegaderm Ag Mesh [Silver] Dermatitis    tegaderm film    FamHx Family History  Problem Relation Age of Onset   Prostate cancer Father 90       currently 54   Breast cancer Paternal Aunt 57       deceased 97   Lung cancer Maternal Grandfather 42       smoker; deceased   Thyroid cancer Cousin 25       pat first cousin; currently 74    Prior to Admission medications   Medication Sig Start Date End Date Taking? Authorizing Provider  ALPRAZolam Duanne Moron) 1 MG tablet Take 1-2 mg by mouth 2 (two) times daily. Take 1 tablet (1 mg) in the morning & Take 2 tablets (2 mg) in the evening.   Yes [provider]  atorvastatin (LIPITOR) 10 MG tablet Take 10 mg by mouth daily.    [provider]  B-D UF III MINI PEN NEEDLES 31G X 5 MM MISC  01/27/15   [provider]  calcium carbonate (  OS-CAL) 600 MG TABS tablet Take 600 mg by mouth daily with breakfast.    [provider]  clopidogrel (PLAVIX) 75 MG tablet Take 75 mg by mouth daily. 08/19/19   [provider]  gabapentin (NEURONTIN) 600 MG tablet Take 600 mg by mouth at bedtime. As needed for sleep or anxiety    [provider]  hydrochlorothiazide (HYDRODIURIL) 25 MG tablet Take 25 mg by mouth daily.     [provider]  insulin lispro (HUMALOG) 100 UNIT/ML injection Inject into the skin 2 (two) times daily. Sliding scale >150-4 u, >200 8 units, >250-12 units, > 300 16 units,    [provider]  LANTUS SOLOSTAR 100 UNIT/ML Solostar Pen Inject 30 Units into the skin at bedtime.  12/30/14   [provider]  metoprolol (LOPRESSOR) 100 MG tablet Take 100 mg by mouth daily.     [provider]  ONE TOUCH ULTRA TEST test strip TEST BLOOD SUGAR AS DIRECTED THREE TIMES A DAY 01/19/15   [provider]  [facility] DELICA LANCETS [id number] MISC use to [place] three times a day as directed 01/28/15   [provider]  rosuvastatin (CRESTOR) 40 MG tablet Take 40 mg by mouth daily.    [provider]  venlafaxine XR (EFFEXOR-XR) 150 MG 24 hr capsule Take 150 mg by mouth daily with breakfast.    [provider]  zolpidem (AMBIEN) 10 MG tablet Take 10 mg by mouth at bedtime as needed for sleep. Reported on 09/17/2015    [provider]    Physical Exam: Vitals:   05/08/21 1148 05/08/21 1200 05/08/21 1215 05/08/21 1230  BP: (!) 87/56 (!) 80/56 111/79 111/80  Pulse: 71 72 71 71  Resp: '18 20 17 17  '$ Temp: 97.7 F (36.5 C)     TempSrc: Oral     SpO2: 96% 96% 97% 96%  Weight: 99.8 kg     Height: '5\' 5"'$  (1.651 m)       General: 60 y.o. female resting in bed in NAD Eyes: PERRL, normal sclera ENMT: Nares patent w/o discharge, orophaynx clear, dentition normal, ears w/o discharge/lesions/ulcers Neck: Supple, trachea midline Cardiovascular: RRR, +S1, S2, no m/g/r, equal pulses throughout Respiratory: CTABL, no w/r/r, normal WOB GI: BS+, NDNT, no masses noted, no organomegaly noted MSK: No c/c; trace b/l pedal edema Skin: No rashes, bruises, ulcerations noted Neuro: A&O x 3, no focal deficits Psyc: Appropriate interaction and affect, calm/cooperative  Labs on Admission: I have personally reviewed following labs and imaging studies  CBC: Recent Labs  Lab 05/08/21 1208  WBC 9.0  NEUTROABS 6.3  HGB 10.0*  HCT 29.4*  MCV 94.8  PLT [id number]   Basic Metabolic Panel: Recent Labs  Lab 05/08/21 1208  NA 135  K 3.3*  CL 99  CO2 25  GLUCOSE 201*  BUN 44*  CREATININE 2.61*  CALCIUM 8.3*   GFR: Estimated Creatinine Clearance: 27.5 mL/min (A) (by C-G formula based on SCr of 2.61 mg/dL (H)). Liver Function Tests: Recent Labs   Lab 05/08/21 1208  AST 15  ALT 16  ALKPHOS 102  BILITOT 0.6  PROT 6.1*  ALBUMIN 2.9*   No results for input(s): LIPASE, AMYLASE in the last 168 hours. No results for input(s): AMMONIA in the last 168 hours. Coagulation Profile: No results for input(s): INR, PROTIME in the last 168 hours. Cardiac Enzymes: No results for input(s): CKTOTAL, CKMB, CKMBINDEX, TROPONINI in the last 168 hours. BNP (last 3 results) No results  for input(s): PROBNP in the last 8760 hours. HbA1C: No results for input(s): HGBA1C in the last 72 hours. CBG: Recent Labs  Lab 05/08/21 1206  GLUCAP 190*   Lipid Profile: No results for input(s): CHOL, HDL, LDLCALC, TRIG, CHOLHDL, LDLDIRECT in the last 72 hours. Thyroid Function Tests: No results for input(s): TSH, T4TOTAL, FREET4, T3FREE, THYROIDAB in the last 72 hours. Anemia Panel: No results for input(s): VITAMINB12, FOLATE, FERRITIN, TIBC, IRON, RETICCTPCT in the last 72 hours. Urine analysis:    Component Value Date/Time   COLORURINE YELLOW 12/04/2014 2240   APPEARANCEUR CLEAR 12/04/2014 2240   LABSPEC 1.003 (L) 12/04/2014 2240   PHURINE 7.0 12/04/2014 2240   GLUCOSEU NEGATIVE 12/04/2014 2240   HGBUR NEGATIVE 12/04/2014 2240   BILIRUBINUR NEGATIVE 12/04/2014 2240   KETONESUR NEGATIVE 12/04/2014 2240   PROTEINUR NEGATIVE 12/04/2014 2240   UROBILINOGEN 1.0 12/04/2014 2240   NITRITE NEGATIVE 12/04/2014 2240   LEUKOCYTESUR NEGATIVE 12/04/2014 2240    Radiological Exams on Admission: No results found.  EKG: None obtained in ED.  Assessment/Plan AKI     - admit to inpt, tele     - secondary to N/V/D; but will check renal US     - fluids, watch nephrotoxins     - hold BP regimen as she is hypotensive  Nausea/Vomiting Diarrhea     - check c diff, GI PCR     - fluids, anti-emetics     - if c diff, GI PCR negative, can add anti-diarrheals   Hx of HTN     - she is now hypotensive; hold home BP regimen for now  Fatigue Near syncope      - likely secondary to dehydration/above     - fluids, follow     - check EKG  Hypokalemia     - replace K+, check Mg2+  Normocytic anemia     - no evidence of bleed, follow  DM2     - A1c, DM diet, SSI, glucose checks  Anxiety     - continue home regimen  History of CVA HLD     - continue plavix, crestor  DVT prophylaxis: SCDs  Code Status: DNR  Family Communication: None at bedside  Consults called: None   Status is: Inpatient  Remains inpatient appropriate because:Inpatient level of care appropriate due to severity of illness  Dispo: The patient is from: Home              Anticipated d/c is to: Home              Patient currently is not medically stable to d/c.   Difficult to place patient No  Jonnie Finner DO [facility]  If 7PM-7AM, please contact night-coverage [email or web address]  05/08/2021, 1:56 PM

## 2021-05-09 ENCOUNTER — Encounter (HOSPITAL_COMMUNITY): Payer: Self-pay | Admitting: Internal Medicine

## 2021-05-09 DIAGNOSIS — Z8673 Personal history of transient ischemic attack (TIA), and cerebral infarction without residual deficits: Secondary | ICD-10-CM

## 2021-05-09 DIAGNOSIS — F419 Anxiety disorder, unspecified: Secondary | ICD-10-CM

## 2021-05-09 DIAGNOSIS — R197 Diarrhea, unspecified: Secondary | ICD-10-CM

## 2021-05-09 DIAGNOSIS — I10 Essential (primary) hypertension: Secondary | ICD-10-CM

## 2021-05-09 DIAGNOSIS — N179 Acute kidney failure, unspecified: Secondary | ICD-10-CM | POA: Diagnosis not present

## 2021-05-09 DIAGNOSIS — D649 Anemia, unspecified: Secondary | ICD-10-CM

## 2021-05-09 DIAGNOSIS — E876 Hypokalemia: Secondary | ICD-10-CM

## 2021-05-09 LAB — CBC
HCT: 27.9 % — ABNORMAL LOW (ref 36.0–46.0)
Hemoglobin: 9.7 g/dL — ABNORMAL LOW (ref 12.0–15.0)
MCH: 33.1 pg (ref 26.0–34.0)
MCHC: 34.8 g/dL (ref 30.0–36.0)
MCV: 95.2 fL (ref 80.0–100.0)
Platelets: 210 10*3/uL (ref 150–400)
RBC: 2.93 MIL/uL — ABNORMAL LOW (ref 3.87–5.11)
RDW: 13 % (ref 11.5–15.5)
WBC: 7 10*3/uL (ref 4.0–10.5)
nRBC: 0 % (ref 0.0–0.2)

## 2021-05-09 LAB — COMPREHENSIVE METABOLIC PANEL
ALT: 15 U/L (ref 0–44)
AST: 13 U/L — ABNORMAL LOW (ref 15–41)
Albumin: 2.9 g/dL — ABNORMAL LOW (ref 3.5–5.0)
Alkaline Phosphatase: 91 U/L (ref 38–126)
Anion gap: 8 (ref 5–15)
BUN: 38 mg/dL — ABNORMAL HIGH (ref 6–20)
CO2: 28 mmol/L (ref 22–32)
Calcium: 9 mg/dL (ref 8.9–10.3)
Chloride: 102 mmol/L (ref 98–111)
Creatinine, Ser: 2.02 mg/dL — ABNORMAL HIGH (ref 0.44–1.00)
GFR, Estimated: 28 mL/min — ABNORMAL LOW (ref >60)
Glucose, Bld: 231 mg/dL — ABNORMAL HIGH (ref 70–99)
Potassium: 4.1 mmol/L (ref 3.5–5.1)
Sodium: 138 mmol/L (ref 135–145)
Total Bilirubin: 0.5 mg/dL (ref 0.3–1.2)
Total Protein: 6.1 g/dL — ABNORMAL LOW (ref 6.5–8.1)

## 2021-05-09 LAB — GLUCOSE, CAPILLARY
Glucose-Capillary: 196 mg/dL — ABNORMAL HIGH (ref 70–99)
Glucose-Capillary: 247 mg/dL — ABNORMAL HIGH (ref 70–99)
Glucose-Capillary: 296 mg/dL — ABNORMAL HIGH (ref 70–99)
Glucose-Capillary: 305 mg/dL — ABNORMAL HIGH (ref 70–99)

## 2021-05-09 LAB — HIV ANTIBODY (ROUTINE TESTING W REFLEX): HIV Screen 4th Generation wRfx: NONREACTIVE

## 2021-05-09 MED ORDER — ACETAMINOPHEN 325 MG PO TABS
650.0000 mg | ORAL_TABLET | ORAL | Status: DC | PRN
  Administered 2021-05-09 – 2021-05-10 (×4): 650 mg via ORAL
  Filled 2021-05-09 (×4): qty 2

## 2021-05-09 MED ORDER — INFLUENZA VAC SPLIT QUAD 0.5 ML IM SUSY
0.5000 mL | PREFILLED_SYRINGE | INTRAMUSCULAR | Status: AC
  Administered 2021-05-10: 0.5 mL via INTRAMUSCULAR
  Filled 2021-05-09: qty 0.5

## 2021-05-09 NOTE — Assessment & Plan Note (Addendum)
-   No reports of bleeding.  No recent labs since 2018 but Hgb 14 at that time - currently ~10 g/dL - Hgb stable

## 2021-05-09 NOTE — Assessment & Plan Note (Signed)
Continue Plavix.  ?

## 2021-05-09 NOTE — Assessment & Plan Note (Addendum)
Repleted. °

## 2021-05-09 NOTE — Assessment & Plan Note (Addendum)
-   baseline creatinine ~ 1 - patient presents with increase in creat >0.3 mg/dL above baseline, creat increase >1.5x baseline presumed to have occurred within past 7 days PTA -Creatinine 2.61 on admission - Etiology presumed prerenal from GI losses with nausea, vomiting, diarrhea on admission -Creatinine improved with fluids, 1.59 at discharge

## 2021-05-09 NOTE — Progress Notes (Signed)
Progress Note    Ashlee Hill   [id number]  DOB: [birth year]  DOA: 05/08/2021     1  PCP: Seward Carol, MD  Initial CC: N/V/D  Hospital Course: Ashlee Hill is a 60 yo female with PMH DMII, CVA, breast CA, HTN, HLD who presented with a near-syncopal episode at home.  She endorsed that she was having approximately 10 days of nausea, vomiting, diarrhea.  She was unable to keep anything down the first couple days but then started drinking diet sodas mostly.  She denied any blood in her vomit or stools. The day before admission she felt dizzy and almost passed out and she was brought to the ER via EMS.  On work-up she was found to be hypotensive with elevated creatinine, 2.61.  She was started on fluids and admitted for further work-up.  Interval History:  Feeling better this morning.  Appetite starting to improve.  No further diarrhea.  She understands we will test stools if recurrent diarrhea but for now we will continue fluids and supportive care.  ROS: Constitutional: negative for chills and fevers, Respiratory: negative for cough, Cardiovascular: negative for chest pain, and Gastrointestinal: negative for abdominal pain  Assessment & Plan: * AKI (acute kidney injury) ([facility]) - baseline creatinine ~ 1 - patient presents with increase in creat >0.3 mg/dL above baseline, creat increase >1.5x baseline presumed to have occurred within past 7 days PTA -Creatinine 2.61 on admission - Etiology presumed prerenal from GI losses with nausea, vomiting, diarrhea on admission - Some improvement in creatinine with fluids overnight, continue fluids.  Oral intake still recovering to maintain adequate nutrition   Diarrhea - Per patient description with nausea, vomiting, diarrhea, likely a gastroenteritis most likely viral as well.  Currently has had no further diarrhea since admission - If no further diarrhea by this afternoon, can cancel stool studies and precautions.  If she does have  another episode, will send for C. difficile and GI pathogen panel - Continue supportive care  Normocytic anemia - No reports of bleeding.  No recent labs since 2018 but Hgb 14 at that time - currently ~10 g/dL - continue trending; some hemodilution expected   History of CVA (cerebrovascular accident) - Continue Plavix  Anxiety - Continue Xanax  Hypokalemia - Replete and recheck as needed  Hypertension - holding home meds while BP recovers   Diabetes mellitus without complication (HCC) - 123456 10.5% on 05/08/2021 - Continue SSI and CBG monitoring -Holding Lantus for now but will resume as appetite recovers    Old records reviewed in assessment of this patient  Antimicrobials:   DVT prophylaxis: SCDs Start: 05/08/21 1506   Code Status:   Code Status: Full Code Family Communication:   Disposition Plan: Status is: Inpatient  Remains inpatient appropriate because:IV treatments appropriate due to intensity of illness or inability to take PO and Inpatient level of care appropriate due to severity of illness  Dispo: The patient is from: Home              Anticipated d/c is to: Home              Patient currently is not medically stable to d/c.   Difficult to place patient No  Risk of unplanned readmission score: Unplanned Admission- Pilot do not use: 10.2   Objective: Blood pressure (!) 102/55, pulse 80, temperature 98.2 F (36.8 C), resp. rate 20, height '5\' 5"'$  (1.651 m), weight 99.8 kg, SpO2 96 %.  Examination: General appearance: alert, cooperative,  and no distress Head: Normocephalic, without obvious abnormality, atraumatic Eyes:  EOMI Lungs: clear to auscultation bilaterally Heart: regular rate and rhythm and S1, S2 normal Abdomen: normal findings: bowel sounds normal and soft, non-tender Extremities:  trace LE edema Skin: mobility and turgor normal and no edema Neurologic: Grossly normal  Consultants:    Procedures:    Data Reviewed: I have personally  reviewed following labs and imaging studies Results for orders placed or performed during the hospital encounter of 05/08/21 (from the past 24 hour(s))  POC CBG, ED     Status: Abnormal   Collection Time: 05/08/21 12:06 PM  Result Value Ref Range   Glucose-Capillary 190 (H) 70 - 99 mg/dL  CBC with Differential/Platelet     Status: Abnormal   Collection Time: 05/08/21 12:08 PM  Result Value Ref Range   WBC 9.0 4.0 - 10.5 K/uL   RBC 3.10 (L) 3.87 - 5.11 MIL/uL   Hemoglobin 10.0 (L) 12.0 - 15.0 g/dL   HCT 29.4 (L) 36.0 - 46.0 %   MCV 94.8 80.0 - 100.0 fL   MCH 32.3 26.0 - 34.0 pg   MCHC 34.0 30.0 - 36.0 g/dL   RDW 13.0 11.5 - 15.5 %   Platelets 227 150 - 400 K/uL   nRBC 0.0 0.0 - 0.2 %   Neutrophils Relative % 71 %   Neutro Abs 6.3 1.7 - 7.7 K/uL   Lymphocytes Relative 16 %   Lymphs Abs 1.4 0.7 - 4.0 K/uL   Monocytes Relative 11 %   Monocytes Absolute 1.0 0.1 - 1.0 K/uL   Eosinophils Relative 2 %   Eosinophils Absolute 0.2 0.0 - 0.5 K/uL   Basophils Relative 0 %   Basophils Absolute 0.0 0.0 - 0.1 K/uL   Immature Granulocytes 0 %   Abs Immature Granulocytes 0.04 0.00 - 0.07 K/uL  Comprehensive metabolic panel     Status: Abnormal   Collection Time: 05/08/21 12:08 PM  Result Value Ref Range   Sodium 135 135 - 145 mmol/L   Potassium 3.3 (L) 3.5 - 5.1 mmol/L   Chloride 99 98 - 111 mmol/L   CO2 25 22 - 32 mmol/L   Glucose, Bld 201 (H) 70 - 99 mg/dL   BUN 44 (H) 6 - 20 mg/dL   Creatinine, Ser 2.61 (H) 0.44 - 1.00 mg/dL   Calcium 8.3 (L) 8.9 - 10.3 mg/dL   Total Protein 6.1 (L) 6.5 - 8.1 g/dL   Albumin 2.9 (L) 3.5 - 5.0 g/dL   AST 15 15 - 41 U/L   ALT 16 0 - 44 U/L   Alkaline Phosphatase 102 38 - 126 U/L   Total Bilirubin 0.6 0.3 - 1.2 mg/dL   GFR, Estimated 21 (L) >60 mL/min   Anion gap 11 5 - 15  Hemoglobin A1c     Status: Abnormal   Collection Time: 05/08/21 12:08 PM  Result Value Ref Range   Hgb A1c MFr Bld 10.5 (H) 4.8 - 5.6 %   Mean Plasma Glucose 254.65 mg/dL   Magnesium     Status: None   Collection Time: 05/08/21 12:08 PM  Result Value Ref Range   Magnesium 2.1 1.7 - 2.4 mg/dL  Resp Panel by RT-PCR (Flu A&B, Covid) Nasopharyngeal Swab     Status: None   Collection Time: 05/08/21  1:43 PM   Specimen: Nasopharyngeal Swab; Nasopharyngeal(NP) swabs in vial transport medium  Result Value Ref Range   SARS Coronavirus 2 by RT PCR NEGATIVE NEGATIVE  Influenza A by PCR NEGATIVE NEGATIVE   Influenza B by PCR NEGATIVE NEGATIVE  Urinalysis, Routine w reflex microscopic     Status: Abnormal   Collection Time: 05/08/21  5:00 PM  Result Value Ref Range   Color, Urine YELLOW YELLOW   APPearance HAZY (A) CLEAR   Specific Gravity, Urine 1.006 1.005 - 1.030   pH 5.0 5.0 - 8.0   Glucose, UA 50 (A) NEGATIVE mg/dL   Hgb urine dipstick MODERATE (A) NEGATIVE   Bilirubin Urine NEGATIVE NEGATIVE   Ketones, ur NEGATIVE NEGATIVE mg/dL   Protein, ur NEGATIVE NEGATIVE mg/dL   Nitrite NEGATIVE NEGATIVE   Leukocytes,Ua MODERATE (A) NEGATIVE   RBC / HPF 0-5 0 - 5 RBC/hpf   WBC, UA 6-10 0 - 5 WBC/hpf   Bacteria, UA MANY (A) NONE SEEN   Squamous Epithelial / LPF 0-5 0 - 5   Mucus PRESENT   CBG monitoring, ED     Status: Abnormal   Collection Time: 05/08/21  5:18 PM  Result Value Ref Range   Glucose-Capillary 147 (H) 70 - 99 mg/dL  Glucose, capillary     Status: Abnormal   Collection Time: 05/08/21  8:39 PM  Result Value Ref Range   Glucose-Capillary 166 (H) 70 - 99 mg/dL  Comprehensive metabolic panel     Status: Abnormal   Collection Time: 05/09/21  5:31 AM  Result Value Ref Range   Sodium 138 135 - 145 mmol/L   Potassium 4.1 3.5 - 5.1 mmol/L   Chloride 102 98 - 111 mmol/L   CO2 28 22 - 32 mmol/L   Glucose, Bld 231 (H) 70 - 99 mg/dL   BUN 38 (H) 6 - 20 mg/dL   Creatinine, Ser 2.02 (H) 0.44 - 1.00 mg/dL   Calcium 9.0 8.9 - 10.3 mg/dL   Total Protein 6.1 (L) 6.5 - 8.1 g/dL   Albumin 2.9 (L) 3.5 - 5.0 g/dL   AST 13 (L) 15 - 41 U/L   ALT 15 0 - 44 U/L    Alkaline Phosphatase 91 38 - 126 U/L   Total Bilirubin 0.5 0.3 - 1.2 mg/dL   GFR, Estimated 28 (L) >60 mL/min   Anion gap 8 5 - 15  CBC     Status: Abnormal   Collection Time: 05/09/21  5:31 AM  Result Value Ref Range   WBC 7.0 4.0 - 10.5 K/uL   RBC 2.93 (L) 3.87 - 5.11 MIL/uL   Hemoglobin 9.7 (L) 12.0 - 15.0 g/dL   HCT 27.9 (L) 36.0 - 46.0 %   MCV 95.2 80.0 - 100.0 fL   MCH 33.1 26.0 - 34.0 pg   MCHC 34.8 30.0 - 36.0 g/dL   RDW 13.0 11.5 - 15.5 %   Platelets 210 150 - 400 K/uL   nRBC 0.0 0.0 - 0.2 %  Glucose, capillary     Status: Abnormal   Collection Time: 05/09/21  8:06 AM  Result Value Ref Range   Glucose-Capillary 196 (H) 70 - 99 mg/dL    Recent Results (from the past 240 hour(s))  Resp Panel by RT-PCR (Flu A&B, Covid) Nasopharyngeal Swab     Status: None   Collection Time: 05/08/21  1:43 PM   Specimen: Nasopharyngeal Swab; Nasopharyngeal(NP) swabs in vial transport medium  Result Value Ref Range Status   SARS Coronavirus 2 by RT PCR NEGATIVE NEGATIVE Final    Comment: (NOTE) SARS-CoV-2 target nucleic acids are NOT DETECTED.  The SARS-CoV-2 RNA is generally detectable in upper  respiratory specimens during the acute phase of infection. The lowest concentration of SARS-CoV-2 viral copies this assay can detect is 138 copies/mL. A negative result does not preclude SARS-Cov-2 infection and should not be used as the sole basis for treatment or other patient management decisions. A negative result may occur with  improper specimen collection/handling, submission of specimen other than nasopharyngeal swab, presence of viral mutation(s) within the areas targeted by this assay, and inadequate number of viral copies(<138 copies/mL). A negative result must be combined with clinical observations, patient history, and epidemiological information. The expected result is Negative.  Fact Sheet for Patients:  EntrepreneurPulse.com.au  Fact Sheet for Healthcare  Providers:  IncredibleEmployment.be  This test is no t yet approved or cleared by the [place] FDA and  has been authorized for detection and/or diagnosis of SARS-CoV-2 by FDA under an Emergency Use Authorization (EUA). This EUA will remain  in effect (meaning this test can be used) for the duration of the COVID-19 declaration under Section 564(b)(1) of the Act, 21 U.S.C.section 360bbb-3(b)(1), unless the authorization is terminated  or revoked sooner.       Influenza A by PCR NEGATIVE NEGATIVE Final   Influenza B by PCR NEGATIVE NEGATIVE Final    Comment: (NOTE) The Xpert Xpress SARS-CoV-2/FLU/RSV plus assay is intended as an aid in the diagnosis of influenza from Nasopharyngeal swab specimens and should not be used as a sole basis for treatment. Nasal washings and aspirates are unacceptable for Xpert Xpress SARS-CoV-2/FLU/RSV testing.  Fact Sheet for Patients: EntrepreneurPulse.com.au  Fact Sheet for Healthcare Providers: IncredibleEmployment.be  This test is not yet approved or cleared by the [place] FDA and has been authorized for detection and/or diagnosis of SARS-CoV-2 by FDA under an Emergency Use Authorization (EUA). This EUA will remain in effect (meaning this test can be used) for the duration of the COVID-19 declaration under Section 564(b)(1) of the Act, 21 U.S.C. section 360bbb-3(b)(1), unless the authorization is terminated or revoked.  Performed at [facility], [place] [address], [place], [place] [address]      Radiology Studies: US RENAL  Result Date: 05/08/2021 CLINICAL DATA:  Acute renal insufficiency EXAM: RENAL / URINARY TRACT ULTRASOUND COMPLETE COMPARISON:  None. FINDINGS: Right Kidney: Renal measurements: 10.2 x 4.8 x 4.6 cm = volume: 116.1 mL. Echogenicity within normal limits. No mass or hydronephrosis visualized. Left Kidney: Renal measurements: 11.5 x 5.6 x 4.8 cm =  volume: 161.2 mL. Echogenicity within normal limits. No mass or hydronephrosis visualized. Bladder: Appears normal for degree of bladder distention. Other: None. IMPRESSION: No cause for the patient's renal insufficiency identified. Electronically Signed   By: Dorise Bullion III M.D.   On: 05/08/2021 15:57   US RENAL  Final Result      Scheduled Meds:  ALPRAZolam  1 mg Oral Daily   And   ALPRAZolam  2 mg Oral QHS   clopidogrel  75 mg Oral Daily   insulin aspart  0-15 Units Subcutaneous TID WC   insulin aspart  0-5 Units Subcutaneous QHS   potassium chloride  40 mEq Oral Daily   rosuvastatin  40 mg Oral Daily   tiZANidine  8 mg Oral QHS   venlafaxine XR  150 mg Oral Q breakfast   PRN Meds: ondansetron **OR** ondansetron (ZOFRAN) IV Continuous Infusions:  lactated ringers 125 mL/hr at 05/09/21 0725     LOS: 1 day  Time spent: Greater than 50% of the 35 minute visit was spent in counseling/coordination of care  for the patient as laid out in the A&P.   Dwyane Dee, MD [facility] 05/09/2021, 11:06 AM

## 2021-05-09 NOTE — Assessment & Plan Note (Signed)
-   Continue Xanax 

## 2021-05-09 NOTE — Hospital Course (Addendum)
Ms. Telep is a 60 yo female with PMH DMII, CVA, breast CA, HTN, HLD who presented with a near-syncopal episode at home.  She endorsed that she was having approximately 10 days of nausea, vomiting, diarrhea.  She was unable to keep anything down the first couple days but then started drinking diet sodas mostly.  She denied any blood in her vomit or stools. The day before admission she felt dizzy and almost passed out and she was brought to the ER via EMS.  On work-up she was found to be hypotensive with elevated creatinine, 2.61.  She was started on fluids and admitted for further work-up. She improved with ongoing supportive care.  She had no further nausea, vomiting, diarrhea.  Renal function also improved with fluids.

## 2021-05-09 NOTE — Assessment & Plan Note (Addendum)
-   A1c 10.5% on 05/08/2021 - Home meds resumed at discharge

## 2021-05-09 NOTE — Assessment & Plan Note (Addendum)
-   Per patient description with nausea, vomiting, diarrhea, likely a gastroenteritis most likely viral as well.  Currently has had no further diarrhea since admission - No further diarrhea since admission and stool studies were therefore canceled - Ongoing supportive care at discharge

## 2021-05-09 NOTE — Assessment & Plan Note (Addendum)
-   BP low during hospitalization and improved prior to discharge -Home meds resumed at discharge

## 2021-05-10 DIAGNOSIS — N179 Acute kidney failure, unspecified: Secondary | ICD-10-CM | POA: Diagnosis not present

## 2021-05-10 LAB — CBC WITH DIFFERENTIAL/PLATELET
Abs Immature Granulocytes: 0.03 10*3/uL (ref 0.00–0.07)
Basophils Absolute: 0 10*3/uL (ref 0.0–0.1)
Basophils Relative: 0 %
Eosinophils Absolute: 0.1 10*3/uL (ref 0.0–0.5)
Eosinophils Relative: 2 %
HCT: 30.1 % — ABNORMAL LOW (ref 36.0–46.0)
Hemoglobin: 10.2 g/dL — ABNORMAL LOW (ref 12.0–15.0)
Immature Granulocytes: 1 %
Lymphocytes Relative: 27 %
Lymphs Abs: 1.5 10*3/uL (ref 0.7–4.0)
MCH: 32.5 pg (ref 26.0–34.0)
MCHC: 33.9 g/dL (ref 30.0–36.0)
MCV: 95.9 fL (ref 80.0–100.0)
Monocytes Absolute: 0.4 10*3/uL (ref 0.1–1.0)
Monocytes Relative: 8 %
Neutro Abs: 3.5 10*3/uL (ref 1.7–7.7)
Neutrophils Relative %: 62 %
Platelets: 188 10*3/uL (ref 150–400)
RBC: 3.14 MIL/uL — ABNORMAL LOW (ref 3.87–5.11)
RDW: 12.8 % (ref 11.5–15.5)
WBC: 5.6 10*3/uL (ref 4.0–10.5)
nRBC: 0 % (ref 0.0–0.2)

## 2021-05-10 LAB — BASIC METABOLIC PANEL
Anion gap: 9 (ref 5–15)
BUN: 27 mg/dL — ABNORMAL HIGH (ref 6–20)
CO2: 28 mmol/L (ref 22–32)
Calcium: 9.3 mg/dL (ref 8.9–10.3)
Chloride: 101 mmol/L (ref 98–111)
Creatinine, Ser: 1.59 mg/dL — ABNORMAL HIGH (ref 0.44–1.00)
GFR, Estimated: 37 mL/min — ABNORMAL LOW (ref >60)
Glucose, Bld: 283 mg/dL — ABNORMAL HIGH (ref 70–99)
Potassium: 4.5 mmol/L (ref 3.5–5.1)
Sodium: 138 mmol/L (ref 135–145)

## 2021-05-10 LAB — MAGNESIUM: Magnesium: 1.6 mg/dL — ABNORMAL LOW (ref 1.7–2.4)

## 2021-05-10 LAB — GLUCOSE, CAPILLARY
Glucose-Capillary: 246 mg/dL — ABNORMAL HIGH (ref 70–99)
Glucose-Capillary: 262 mg/dL — ABNORMAL HIGH (ref 70–99)

## 2021-05-10 MED ORDER — MAGNESIUM SULFATE 2 GM/50ML IV SOLN
2.0000 g | Freq: Once | INTRAVENOUS | Status: AC
  Administered 2021-05-10: 2 g via INTRAVENOUS
  Filled 2021-05-10: qty 50

## 2021-05-10 NOTE — Progress Notes (Signed)
Initial Nutrition Assessment  DOCUMENTATION CODES:   Obesity unspecified  INTERVENTION:  - will monitor for needs if patient unable to d/c today.    NUTRITION DIAGNOSIS:   Inadequate oral intake related to acute illness, nausea, vomiting, diarrhea as evidenced by per patient/family report.  GOAL:   Patient will meet greater than or equal to 90% of their needs  MONITOR:   PO intake, Labs, Weight trends, I & O's  REASON FOR ASSESSMENT:   Malnutrition Screening Tool  ASSESSMENT:   60 yo female with medical history of type 2 DM, CVA, breast cancer, HTN, and HLD. She presented to the ED via EMS due to near-syncope at home. She reported ~10 days of N/V/D at home with inability to keep anything down the first few days of these symptoms. She was found to be hypotensive in the ED and was started on IV fluids.  She was able to eat 100% of all meals yesterday (total of 1290 kcal and 74 grams protein).   Discharge order for d/c home was entered ~1 hour ago; discharge summary not yet entered.   She has not been seen by a [place] RD since 09/2014.  Weight on 9/17 was 220 lb and prior to that the most recently documented weight was 223.5 lb on 08/29/19. Mild pitting edema to all extremities documented in the edema section of flow sheet.    Labs reviewed; CBGs: 246 and 262 mg/dl, BUN: 27 mg/dl, creatinine: 1.59 mg/dl, Mg: 1.6 mg/dl, GFR: 37 ml/min.   Medications reviewed; sliding scale novolog, 2 g IV Mg sulfate x1 run 9/19, 40 mEq Klor-Con/day.  IVF; LR @ 75 ml/hr.    NUTRITION - FOCUSED PHYSICAL EXAM:  Did not complete at this time.   Diet Order:   Diet Order             Diet - low sodium heart healthy           Diet Carb Modified Fluid consistency: Thin; Room service appropriate? Yes  Diet effective now                   EDUCATION NEEDS:   No education needs have been identified at this time  Skin:  Skin Assessment: Reviewed RN Assessment  Last BM:  9/18  (type 1 x1)  Height:   Ht Readings from Last 1 Encounters:  05/08/21 '5\' 5"'$  (1.651 m)    Weight:   Wt Readings from Last 1 Encounters:  05/08/21 99.8 kg     Estimated Nutritional Needs:  Kcal:  1650-1900 kcal Protein:  85-95 grams Fluid:  >/= 2.3 L/day      Jarome Matin, MS, RD, LDN, CNSC Inpatient Clinical Dietitian RD pager # available in [facility]  After hours/weekend pager # available in [facility]

## 2021-05-10 NOTE — Discharge Summary (Signed)
Physician Discharge Summary   Ashlee Hill [id number] DOB: 03-06-1961 DOA: 05/08/2021  PCP: Seward Carol, MD  Admit date: 05/08/2021 Discharge date:  05/10/2021  Admitted From: home Disposition:  home Discharging physician: Dwyane Dee, MD  Recommendations for Outpatient Follow-up:  Continue ongoing routine care  Home Health:  Equipment/Devices:   Patient discharged to home in Discharge Condition: stable Risk of unplanned readmission score: Unplanned Admission- Pilot do not use: 11.42  CODE STATUS: Full Diet recommendation:  Diet Orders (From admission, onward)     Start     Ordered   05/10/21 0000  Diet - low sodium heart healthy        05/10/21 1047   05/08/21 1458  Diet Carb Modified Fluid consistency: Thin; Room service appropriate? Yes  Diet effective now       Question Answer Comment  Diet-HS Snack? Nothing   Calorie Level Medium 1600-2000   Fluid consistency: Thin   Room service appropriate? Yes      05/08/21 1457            Hospital Course: [name] is a 60 yo female with PMH DMII, CVA, breast CA, HTN, HLD who presented with a near-syncopal episode at home.  She endorsed that she was having approximately 10 days of nausea, vomiting, diarrhea.  She was unable to keep anything down the first couple days but then started drinking diet sodas mostly.  She denied any blood in her vomit or stools. The day before admission she felt dizzy and almost passed out and she was brought to the ER via EMS.  On work-up she was found to be hypotensive with elevated creatinine, 2.61.  She was started on fluids and admitted for further work-up. She improved with ongoing supportive care.  She had no further nausea, vomiting, diarrhea.  Renal function also improved with fluids.  * AKI (acute kidney injury) ([place]) - baseline creatinine ~ 1 - patient presents with increase in creat >0.3 mg/dL above baseline, creat increase >1.5x baseline presumed to have occurred within  past 7 days PTA -Creatinine 2.61 on admission - Etiology presumed prerenal from GI losses with nausea, vomiting, diarrhea on admission -Creatinine improved with fluids, 1.59 at discharge   Diarrhea - Per patient description with nausea, vomiting, diarrhea, likely a gastroenteritis most likely viral as well.  Currently has had no further diarrhea since admission - No further diarrhea since admission and stool studies were therefore canceled - Ongoing supportive care at discharge  Normocytic anemia - No reports of bleeding.  No recent labs since 2018 but Hgb 14 at that time - currently ~10 g/dL - Hgb stable   History of CVA (cerebrovascular accident) - Continue Plavix  Anxiety - Continue Xanax  Hypokalemia - Repleted  Hypertension - BP low during hospitalization and improved prior to discharge -Home meds resumed at discharge  Diabetes mellitus without complication ([place]) - [id number] 10.5% on 05/08/2021 - Home meds resumed at discharge    The patient's chronic medical conditions were treated accordingly per the patient's home medication regimen except as noted.  On day of discharge, patient was felt deemed stable for discharge. Patient/family member advised to call PCP or come back to ER if needed.   Principal Diagnosis: AKI (acute kidney injury) [facility])  Discharge Diagnoses: Active Hospital Problems   Diagnosis Date Noted   AKI (acute kidney injury) ([place]) 05/08/2021    Priority: High   Diarrhea     Priority: High   Hypertension    Hypokalemia  Anxiety    History of CVA (cerebrovascular accident)    Normocytic anemia    Diabetes mellitus without complication ([place]) 99991111    Resolved Hospital Problems  No resolved problems to display.    Discharge Instructions     Diet - low sodium heart healthy   Complete by: As directed    Increase activity slowly   Complete by: As directed       Allergies as of 05/10/2021       Reactions   Tegaderm Ag Mesh [silver]  Dermatitis   tegaderm film        Medication List     TAKE these medications    ADVIL PO Take 2 tablets by mouth daily as needed (headache).   ALPRAZolam 1 MG tablet Commonly known as: XANAX Take 1-2 mg by mouth 2 (two) times daily. Take 1 tablet (1 mg) in the morning & Take 2 tablets (2 mg) in the evening.   B-D UF III MINI PEN NEEDLES 31G X 5 MM Misc Generic drug: Insulin Pen Needle   clopidogrel 75 MG tablet Commonly known as: PLAVIX Take 75 mg by mouth daily.   DRAMAMINE PO Take 2 tablets by mouth 3 (three) times daily as needed (nausea).   gabapentin 600 MG tablet Commonly known as: NEURONTIN Take 600 mg by mouth daily as needed (pain or anxiety).   Lantus SoloStar 100 UNIT/ML Solostar Pen Generic drug: insulin glargine Inject 30 Units into the skin daily.   losartan-hydrochlorothiazide 50-12.5 MG tablet Commonly known as: HYZAAR Take 1 tablet by mouth daily.   metFORMIN 500 MG 24 hr tablet Commonly known as: GLUCOPHAGE-XR Take 500 mg by mouth 2 (two) times daily.   metoprolol succinate 100 MG 24 hr tablet Commonly known as: TOPROL-XL Take 100 mg by mouth daily.   NovoLIN R 100 units/mL injection Generic drug: insulin regular Sliding scale:   ONE TOUCH ULTRA TEST test strip Generic drug: glucose blood TEST BLOOD SUGAR AS DIRECTED THREE TIMES A DAY   OneTouch Delica Lancets [id number] Misc use to STICK FINGER three times a day as directed   rosuvastatin 40 MG tablet Commonly known as: CRESTOR Take 40 mg by mouth daily.   tiZANidine 4 MG tablet Commonly known as: ZANAFLEX Take 8 mg by mouth at bedtime.   venlafaxine XR 150 MG 24 hr capsule Commonly known as: EFFEXOR-XR Take 150 mg by mouth daily with breakfast.        Allergies  Allergen Reactions   Tegaderm Ag Mesh [Silver] Dermatitis    tegaderm film    Consultations:   Discharge Exam: BP (!) 188/99 (BP Location: Right Arm)   Pulse 89   Temp 97.7 F (36.5 C)   Resp 18   Ht '5\' 5"'$   (1.651 m)   Wt 99.8 kg   SpO2 99%   BMI 36.61 kg/m  General appearance: alert, cooperative, and no distress Head: Normocephalic, without obvious abnormality, atraumatic Eyes:  EOMI Lungs: clear to auscultation bilaterally Heart: regular rate and rhythm and S1, S2 normal Abdomen: normal findings: bowel sounds normal and soft, non-tender Extremities:  trace LE edema Skin: mobility and turgor normal and no edema Neurologic: Grossly normal  The results of significant diagnostics from this hospitalization (including imaging, microbiology, ancillary and laboratory) are listed below for reference.   Microbiology: Recent Results (from the past 240 hour(s))  Resp Panel by RT-PCR (Flu A&B, Covid) Nasopharyngeal Swab     Status: None   Collection Time: 05/08/21  1:43 PM  Specimen: Nasopharyngeal Swab; Nasopharyngeal(NP) swabs in vial transport medium  Result Value Ref Range Status   SARS Coronavirus 2 by RT PCR NEGATIVE NEGATIVE Final    Comment: (NOTE) SARS-CoV-2 target nucleic acids are NOT DETECTED.  The SARS-CoV-2 RNA is generally detectable in upper respiratory specimens during the acute phase of infection. The lowest concentration of SARS-CoV-2 viral copies this assay can detect is 138 copies/mL. A negative result does not preclude SARS-Cov-2 infection and should not be used as the sole basis for treatment or other patient management decisions. A negative result may occur with  improper specimen collection/handling, submission of specimen other than nasopharyngeal swab, presence of viral mutation(s) within the areas targeted by this assay, and inadequate number of viral copies(<138 copies/mL). A negative result must be combined with clinical observations, patient history, and epidemiological information. The expected result is Negative.  Fact Sheet for Patients:  EntrepreneurPulse.com.au  Fact Sheet for Healthcare Providers:   IncredibleEmployment.be  This test is no t yet approved or cleared by the [place] FDA and  has been authorized for detection and/or diagnosis of SARS-CoV-2 by FDA under an Emergency Use Authorization (EUA). This EUA will remain  in effect (meaning this test can be used) for the duration of the COVID-19 declaration under Section 564(b)(1) of the Act, 21 U.S.C.section 360bbb-3(b)(1), unless the authorization is terminated  or revoked sooner.       Influenza A by PCR NEGATIVE NEGATIVE Final   Influenza B by PCR NEGATIVE NEGATIVE Final    Comment: (NOTE) The Xpert Xpress SARS-CoV-2/FLU/RSV plus assay is intended as an aid in the diagnosis of influenza from Nasopharyngeal swab specimens and should not be used as a sole basis for treatment. Nasal washings and aspirates are unacceptable for Xpert Xpress SARS-CoV-2/FLU/RSV testing.  Fact Sheet for Patients: EntrepreneurPulse.com.au  Fact Sheet for Healthcare Providers: IncredibleEmployment.be  This test is not yet approved or cleared by the [place] FDA and has been authorized for detection and/or diagnosis of SARS-CoV-2 by FDA under an Emergency Use Authorization (EUA). This EUA will remain in effect (meaning this test can be used) for the duration of the COVID-19 declaration under Section 564(b)(1) of the Act, 21 U.S.C. section 360bbb-3(b)(1), unless the authorization is terminated or revoked.  Performed at [facility], [place] [address], [place],  [address]      Labs: BNP (last 3 results) No results for input(s): BNP in the last 8760 hours. Basic Metabolic Panel: Recent Labs  Lab 05/08/21 1208 05/09/21 0531 05/10/21 0415  NA 135 138 138  K 3.3* 4.1 4.5  CL 99 102 101  CO2 '25 28 28  '$ GLUCOSE 201* 231* 283*  BUN 44* 38* 27*  CREATININE 2.61* 2.02* 1.59*  CALCIUM 8.3* 9.0 9.3  MG 2.1  --  1.6*   Liver Function Tests: Recent  Labs  Lab 05/08/21 1208 05/09/21 0531  AST 15 13*  ALT 16 15  ALKPHOS 102 91  BILITOT 0.6 0.5  PROT 6.1* 6.1*  ALBUMIN 2.9* 2.9*   No results for input(s): LIPASE, AMYLASE in the last 168 hours. No results for input(s): AMMONIA in the last 168 hours. CBC: Recent Labs  Lab 05/08/21 1208 05/09/21 0531 05/10/21 0415  WBC 9.0 7.0 5.6  NEUTROABS 6.3  --  3.5  HGB 10.0* 9.7* 10.2*  HCT 29.4* 27.9* 30.1*  MCV 94.8 95.2 95.9  PLT 227 210 188   Cardiac Enzymes: No results for input(s): CKTOTAL, CKMB, CKMBINDEX, TROPONINI in the last 168 hours.  BNP: Invalid input(s): POCBNP CBG: Recent Labs  Lab 05/09/21 1149 05/09/21 1656 05/09/21 2051 05/10/21 0735 05/10/21 1144  GLUCAP 247* 296* 305* 246* 262*   D-Dimer No results for input(s): DDIMER in the last 72 hours. Hgb A1c Recent Labs    05/08/21 1208  HGBA1C 10.5*   Lipid Profile No results for input(s): CHOL, HDL, LDLCALC, TRIG, CHOLHDL, LDLDIRECT in the last 72 hours. Thyroid function studies No results for input(s): TSH, T4TOTAL, T3FREE, THYROIDAB in the last 72 hours.  Invalid input(s): FREET3 Anemia work up No results for input(s): VITAMINB12, FOLATE, FERRITIN, TIBC, IRON, RETICCTPCT in the last 72 hours. Urinalysis    Component Value Date/Time   COLORURINE YELLOW 05/08/2021 1700   APPEARANCEUR HAZY (A) 05/08/2021 1700   LABSPEC 1.006 05/08/2021 1700   PHURINE 5.0 05/08/2021 1700   GLUCOSEU 50 (A) 05/08/2021 1700   HGBUR MODERATE (A) 05/08/2021 1700   BILIRUBINUR NEGATIVE 05/08/2021 1700   KETONESUR NEGATIVE 05/08/2021 1700   PROTEINUR NEGATIVE 05/08/2021 1700   UROBILINOGEN 1.0 12/04/2014 2240   NITRITE NEGATIVE 05/08/2021 1700   LEUKOCYTESUR MODERATE (A) 05/08/2021 1700   Sepsis Labs Invalid input(s): PROCALCITONIN,  WBC,  LACTICIDVEN Microbiology Recent Results (from the past 240 hour(s))  Resp Panel by RT-PCR (Flu A&B, Covid) Nasopharyngeal Swab     Status: None   Collection Time: 05/08/21  1:43  PM   Specimen: Nasopharyngeal Swab; Nasopharyngeal(NP) swabs in vial transport medium  Result Value Ref Range Status   SARS Coronavirus 2 by RT PCR NEGATIVE NEGATIVE Final    Comment: (NOTE) SARS-CoV-2 target nucleic acids are NOT DETECTED.  The SARS-CoV-2 RNA is generally detectable in upper respiratory specimens during the acute phase of infection. The lowest concentration of SARS-CoV-2 viral copies this assay can detect is 138 copies/mL. A negative result does not preclude SARS-Cov-2 infection and should not be used as the sole basis for treatment or other patient management decisions. A negative result may occur with  improper specimen collection/handling, submission of specimen other than nasopharyngeal swab, presence of viral mutation(s) within the areas targeted by this assay, and inadequate number of viral copies(<138 copies/mL). A negative result must be combined with clinical observations, patient history, and epidemiological information. The expected result is Negative.  Fact Sheet for Patients:  EntrepreneurPulse.com.au  Fact Sheet for Healthcare Providers:  IncredibleEmployment.be  This test is no t yet approved or cleared by the [place] FDA and  has been authorized for detection and/or diagnosis of SARS-CoV-2 by FDA under an Emergency Use Authorization (EUA). This EUA will remain  in effect (meaning this test can be used) for the duration of the COVID-19 declaration under Section 564(b)(1) of the Act, 21 U.S.C.section 360bbb-3(b)(1), unless the authorization is terminated  or revoked sooner.       Influenza A by PCR NEGATIVE NEGATIVE Final   Influenza B by PCR NEGATIVE NEGATIVE Final    Comment: (NOTE) The Xpert Xpress SARS-CoV-2/FLU/RSV plus assay is intended as an aid in the diagnosis of influenza from Nasopharyngeal swab specimens and should not be used as a sole basis for treatment. Nasal washings and aspirates are  unacceptable for Xpert Xpress SARS-CoV-2/FLU/RSV testing.  Fact Sheet for Patients: EntrepreneurPulse.com.au  Fact Sheet for Healthcare Providers: IncredibleEmployment.be  This test is not yet approved or cleared by the [place] FDA and has been authorized for detection and/or diagnosis of SARS-CoV-2 by FDA under an Emergency Use Authorization (EUA). This EUA will remain in effect (meaning this test can be used) for the  duration of the COVID-19 declaration under Section 564(b)(1) of the Act, 21 U.S.C. section 360bbb-3(b)(1), unless the authorization is terminated or revoked.  Performed at [facility], [place] [address] [place], [place] [address]     Procedures/Studies: US RENAL  Result Date: 05/08/2021 CLINICAL DATA:  Acute renal insufficiency EXAM: RENAL / URINARY TRACT ULTRASOUND COMPLETE COMPARISON:  None. FINDINGS: Right Kidney: Renal measurements: 10.2 x 4.8 x 4.6 cm = volume: 116.1 mL. Echogenicity within normal limits. No mass or hydronephrosis visualized. Left Kidney: Renal measurements: 11.5 x 5.6 x 4.8 cm = volume: 161.2 mL. Echogenicity within normal limits. No mass or hydronephrosis visualized. Bladder: Appears normal for degree of bladder distention. Other: None. IMPRESSION: No cause for the patient's renal insufficiency identified. Electronically Signed   By: Dorise Bullion III M.D.   On: 05/08/2021 15:57     Time coordinating discharge: Over 30 minutes    Dwyane Dee, MD  [facility] 05/10/2021, 1:17 PM

## 2021-05-10 NOTE — TOC Transition Note (Signed)
Transition of Care [facility]) - CM/SW Discharge Note   Patient Details  Name: Ashlee Hill MRN: [id number] Date of Birth: [birth year]  Transition of Care [facility]) CM/SW Contact:  Leeroy Cha, RN Phone Number: 05/10/2021, 2:54 PM   Clinical Narrative:    Dcd to home with self care, no toc needs present.   Final next level of care: Home/Self Care Barriers to Discharge: No Barriers Identified   Patient Goals and CMS Choice Patient states their goals for this hospitalization and ongoing recovery are:: to go home CMS Medicare.gov Compare Post Acute Care list provided to:: Patient    Discharge Placement                       Discharge Plan and Services   Discharge Planning Services: CM Consult                                 Social Determinants of Health (SDOH) Interventions     Readmission Risk Interventions No flowsheet data found.

## 2021-05-10 NOTE — Plan of Care (Signed)
Plan of care reviewed with pt. Pt discharge instructions reviewed and pt able to verbalize understanding. Pt safety maintained.  Problem: Education: Goal: Knowledge of General Education information will improve Description: Including pain rating scale, medication(s)/side effects and non-pharmacologic comfort measures 05/10/2021 1258 by Barrington Ellison, RN Outcome: Adequate for Discharge 05/10/2021 1258 by Barrington Ellison, RN Outcome: Adequate for Discharge   Problem: Health Behavior/Discharge Planning: Goal: Ability to manage health-related needs will improve 05/10/2021 1258 by Barrington Ellison, RN Outcome: Adequate for Discharge 05/10/2021 1258 by Barrington Ellison, RN Outcome: Adequate for Discharge   Problem: Clinical Measurements: Goal: Ability to maintain clinical measurements within normal limits will improve 05/10/2021 1258 by Barrington Ellison, RN Outcome: Adequate for Discharge 05/10/2021 1258 by Barrington Ellison, RN Outcome: Adequate for Discharge Goal: Will remain free from infection 05/10/2021 1258 by Barrington Ellison, RN Outcome: Adequate for Discharge 05/10/2021 1258 by Barrington Ellison, RN Outcome: Adequate for Discharge Goal: Diagnostic test results will improve 05/10/2021 1258 by Barrington Ellison, RN Outcome: Adequate for Discharge 05/10/2021 1258 by Barrington Ellison, RN Outcome: Adequate for Discharge Goal: Respiratory complications will improve 05/10/2021 1258 by Barrington Ellison, RN Outcome: Adequate for Discharge 05/10/2021 1258 by Barrington Ellison, RN Outcome: Adequate for Discharge Goal: Cardiovascular complication will be avoided 05/10/2021 1258 by Barrington Ellison, RN Outcome: Adequate for Discharge 05/10/2021 1258 by Barrington Ellison, RN Outcome: Adequate for Discharge   Problem: Activity: Goal: Risk for activity intolerance will decrease 05/10/2021 1258 by Barrington Ellison, RN Outcome: Adequate for Discharge 05/10/2021 1258 by Barrington Ellison, RN Outcome:  Adequate for Discharge   Problem: Nutrition: Goal: Adequate nutrition will be maintained 05/10/2021 1258 by Barrington Ellison, RN Outcome: Adequate for Discharge 05/10/2021 1258 by Barrington Ellison, RN Outcome: Adequate for Discharge   Problem: Coping: Goal: Level of anxiety will decrease 05/10/2021 1258 by Barrington Ellison, RN Outcome: Adequate for Discharge 05/10/2021 1258 by Barrington Ellison, RN Outcome: Adequate for Discharge   Problem: Elimination: Goal: Will not experience complications related to bowel motility 05/10/2021 1258 by Barrington Ellison, RN Outcome: Adequate for Discharge 05/10/2021 1258 by Barrington Ellison, RN Outcome: Adequate for Discharge Goal: Will not experience complications related to urinary retention 05/10/2021 1258 by Barrington Ellison, RN Outcome: Adequate for Discharge 05/10/2021 1258 by Barrington Ellison, RN Outcome: Adequate for Discharge   Problem: Pain Managment: Goal: General experience of comfort will improve 05/10/2021 1258 by Barrington Ellison, RN Outcome: Adequate for Discharge 05/10/2021 1258 by Barrington Ellison, RN Outcome: Adequate for Discharge   Problem: Safety: Goal: Ability to remain free from injury will improve 05/10/2021 1258 by Barrington Ellison, RN Outcome: Adequate for Discharge 05/10/2021 1258 by Barrington Ellison, RN Outcome: Adequate for Discharge   Problem: Skin Integrity: Goal: Risk for impaired skin integrity will decrease 05/10/2021 1258 by Barrington Ellison, RN Outcome: Adequate for Discharge 05/10/2021 1258 by Barrington Ellison, RN Outcome: Adequate for Discharge

## 2021-07-07 ENCOUNTER — Ambulatory Visit
Admission: RE | Admit: 2021-07-07 | Discharge: 2021-07-07 | Disposition: A | Discharge status: Home / Self Care | Payer: 59 | Source: Ambulatory Visit | Attending: Internal Medicine | Admitting: Internal Medicine

## 2021-07-07 ENCOUNTER — Other Ambulatory Visit: Payer: Self-pay | Admitting: Internal Medicine

## 2021-07-07 DIAGNOSIS — M25511 Pain in right shoulder: Secondary | ICD-10-CM

## 2021-11-20 DEATH — deceased
